# Patient Record
Sex: Male | Born: 1985 | Race: White | Hispanic: No | Marital: Single | Smoking: Current every day smoker
Health system: Southern US, Community
[De-identification: ages and names within clinical notes are randomized; demographics above are authoritative.]

## PROBLEM LIST (undated history)

## (undated) DIAGNOSIS — K219 Gastro-esophageal reflux disease without esophagitis: Secondary | ICD-10-CM

## (undated) DIAGNOSIS — F4310 Post-traumatic stress disorder, unspecified: Secondary | ICD-10-CM

## (undated) DIAGNOSIS — M1990 Unspecified osteoarthritis, unspecified site: Secondary | ICD-10-CM

## (undated) DIAGNOSIS — F1110 Opioid abuse, uncomplicated: Secondary | ICD-10-CM

## (undated) DIAGNOSIS — K759 Inflammatory liver disease, unspecified: Secondary | ICD-10-CM

## (undated) DIAGNOSIS — K2970 Gastritis, unspecified, without bleeding: Secondary | ICD-10-CM

## (undated) DIAGNOSIS — N3281 Overactive bladder: Secondary | ICD-10-CM

## (undated) DIAGNOSIS — E119 Type 2 diabetes mellitus without complications: Secondary | ICD-10-CM

## (undated) DIAGNOSIS — F319 Bipolar disorder, unspecified: Secondary | ICD-10-CM

## (undated) DIAGNOSIS — F1120 Opioid dependence, uncomplicated: Secondary | ICD-10-CM

## (undated) HISTORY — PX: WISDOM TOOTH EXTRACTION: SHX21

---

## 2011-11-05 ENCOUNTER — Emergency Department (HOSPITAL_COMMUNITY)
Admission: EM | Admit: 2011-11-05 | Discharge: 2011-11-05 | Disposition: A | Discharge status: Home / Self Care | Payer: Self-pay | Attending: Emergency Medicine | Admitting: Emergency Medicine

## 2011-11-05 ENCOUNTER — Emergency Department (HOSPITAL_COMMUNITY): Payer: Self-pay

## 2011-11-05 ENCOUNTER — Encounter (HOSPITAL_COMMUNITY): Payer: Self-pay | Admitting: *Deleted

## 2011-11-05 DIAGNOSIS — F1920 Other psychoactive substance dependence, uncomplicated: Secondary | ICD-10-CM | POA: Insufficient documentation

## 2011-11-05 DIAGNOSIS — F17200 Nicotine dependence, unspecified, uncomplicated: Secondary | ICD-10-CM | POA: Insufficient documentation

## 2011-11-05 DIAGNOSIS — M25561 Pain in right knee: Secondary | ICD-10-CM

## 2011-11-05 DIAGNOSIS — M25569 Pain in unspecified knee: Secondary | ICD-10-CM | POA: Insufficient documentation

## 2011-11-05 LAB — CBC
MCV: 91.5 fL (ref 78.0–100.0)
Platelets: 151 10*3/uL (ref 150–400)
RBC: 4.48 MIL/uL (ref 4.22–5.81)
RDW: 12.3 % (ref 11.5–15.5)
WBC: 7 10*3/uL (ref 4.0–10.5)

## 2011-11-05 LAB — COMPREHENSIVE METABOLIC PANEL
ALT: 62 U/L — ABNORMAL HIGH (ref 0–53)
AST: 41 U/L — ABNORMAL HIGH (ref 0–37)
Albumin: 4.1 g/dL (ref 3.5–5.2)
Alkaline Phosphatase: 99 U/L (ref 39–117)
CO2: 29 mEq/L (ref 19–32)
Chloride: 101 mEq/L (ref 96–112)
GFR calc non Af Amer: >90 mL/min (ref >90)
Potassium: 4 mEq/L (ref 3.5–5.1)
Sodium: 139 mEq/L (ref 135–145)
Total Bilirubin: 0.3 mg/dL (ref 0.3–1.2)

## 2011-11-05 LAB — RAPID URINE DRUG SCREEN, HOSP PERFORMED
Amphetamines: NOT DETECTED
Barbiturates: NOT DETECTED
Tetrahydrocannabinol: NOT DETECTED

## 2011-11-05 MED ORDER — IBUPROFEN 800 MG PO TABS
800.0000 mg | ORAL_TABLET | Freq: Three times a day (TID) | ORAL | Status: DC | PRN
  Administered 2011-11-05: 800 mg via ORAL
  Filled 2011-11-05: qty 1

## 2011-11-05 NOTE — ED Notes (Signed)
Meal tray has been ordered.

## 2011-11-05 NOTE — ED Notes (Signed)
Pt needs medical clearance and psych clearance.  Pt is not suicidal or homocidal, but is trying to get in a lifeskills program in [place].  Pt has pain with right knee

## 2011-11-05 NOTE — ED Provider Notes (Addendum)
History     CSN: [id number]  Arrival date & time 11/05/11  1254   First MD Initiated Contact with Patient 11/05/11 1408      Chief Complaint  Patient presents with  . Medical Clearance  . Knee Injury    right    (Consider location/radiation/quality/duration/timing/severity/associated sxs/prior treatment) HPI Comments: Patient presents today for medical clearance for a drug rehabilitation program.  Patient denies any suicidal or homicidal thoughts.  Patient also complains of pain in his right knee has been ongoing for 3 years.  He notes an old right knee injury when he was a proximally 26 years old.  He twisting knee injury and was told he had an anterior cruciate ligament tear which he never followed up on.  He now has problems with his knee locking up after sitting for longer periods.  Patient does not bear full weight on it but that is not new he is been walking like that for the last 3 years.  Patient denies any fevers redness or new injuries.  Patient is a 26 y.o. male presenting with mental health disorder. The history is provided by the patient.  Mental Health Problem  Additional symptoms of the illness do not include no headaches or no abdominal pain.    History reviewed. No pertinent past medical history.  History reviewed. No pertinent past surgical history.  No family history on file.  History  Substance Use Topics  . Smoking status: Current Everyday Smoker  . Smokeless tobacco: Not on file  . Alcohol Use: Yes     Former occ      Review of Systems  Constitutional: Negative.  Negative for fever and chills.  HENT: Negative.   Eyes: Negative.  Negative for discharge and redness.  Respiratory: Negative.  Negative for cough and shortness of breath.   Cardiovascular: Negative.  Negative for chest pain.  Gastrointestinal: Negative.  Negative for nausea, vomiting and abdominal pain.  Genitourinary: Negative.  Negative for hematuria.  Musculoskeletal: Negative for  back pain.  Skin: Negative.  Negative for color change and rash.  Neurological: Negative for syncope and headaches.  Hematological: Negative.  Negative for adenopathy.  Psychiatric/Behavioral: Negative for confusion.  All other systems reviewed and are negative.    Allergies  Review of patient's allergies indicates no known allergies.  Home Medications  No current outpatient prescriptions on file.  BP 106/74  Temp(Src) 97.6 F (36.4 C) (Oral)  Resp 20  SpO2 97%  Physical Exam  Nursing note and vitals reviewed. Constitutional: He is oriented to person, place, and time. He appears well-developed and well-nourished.  Non-toxic appearance. He does not have a sickly appearance.  HENT:  Head: Normocephalic and atraumatic.  Eyes: Conjunctivae, EOM and lids are normal. Pupils are equal, round, and reactive to light.  Neck: Trachea normal, normal range of motion and full passive range of motion without pain. Neck supple.  Cardiovascular: Normal rate, regular rhythm and normal heart sounds.   Pulmonary/Chest: Effort normal and breath sounds normal. No respiratory distress.  Abdominal: Soft. Normal appearance. He exhibits no distension. There is no tenderness. There is no rebound and no CVA tenderness.  Musculoskeletal: Normal range of motion.       Patient can flex his right knee but has some discomfort with doing so.  No specific anterior or posterior drawer sign.  No medial or lateral joint laxity.  No swelling, crepitus, erythema or warmth to the joint.  Patient can walk but does not have a normal gait  but he states this is not new that he does not put full weight on that right leg.  Neurological: He is alert and oriented to person, place, and time. He has normal strength.  Skin: Skin is warm, dry and intact. No rash noted.  Psychiatric: He has a normal mood and affect. His behavior is normal. Judgment and thought content normal.    ED Course  Procedures (including critical care  time)  Results for orders placed during the hospital encounter of 11/05/11  CBC      Component Value Range   WBC 7.0  4.0 - 10.5 (K/uL)   RBC 4.48  4.22 - 5.81 (MIL/uL)   Hemoglobin 14.4  13.0 - 17.0 (g/dL)   HCT 16.1  09.6 - 04.5 (%)   MCV 91.5  78.0 - 100.0 (fL)   MCH 32.1  26.0 - 34.0 (pg)   MCHC 35.1  30.0 - 36.0 (g/dL)   RDW 40.9  81.1 - 91.4 (%)   Platelets 151  150 - 400 (K/uL)  COMPREHENSIVE METABOLIC PANEL      Component Value Range   Sodium 139  135 - 145 (mEq/L)   Potassium 4.0  3.5 - 5.1 (mEq/L)   Chloride 101  96 - 112 (mEq/L)   CO2 29  19 - 32 (mEq/L)   Glucose, Bld 80  70 - 99 (mg/dL)   BUN 14  6 - 23 (mg/dL)   Creatinine, Ser 7.82  0.50 - 1.35 (mg/dL)   Calcium 95.6  8.4 - 10.5 (mg/dL)   Total Protein 7.3  6.0 - 8.3 (g/dL)   Albumin 4.1  3.5 - 5.2 (g/dL)   AST 41 (*) 0 - 37 (U/L)   ALT 62 (*) 0 - 53 (U/L)   Alkaline Phosphatase 99  39 - 117 (U/L)   Total Bilirubin 0.3  0.3 - 1.2 (mg/dL)   GFR calc non Af Amer >90  >90 (mL/min)   GFR calc Af Amer >90  >90 (mL/min)  ETHANOL      Component Value Range   Alcohol, Ethyl (B) <11  0 - 11 (mg/dL)  URINE RAPID DRUG SCREEN (HOSP PERFORMED)      Component Value Range   Opiates NONE DETECTED  NONE DETECTED    Cocaine NONE DETECTED  NONE DETECTED    Benzodiazepines NONE DETECTED  NONE DETECTED    Amphetamines NONE DETECTED  NONE DETECTED    Tetrahydrocannabinol NONE DETECTED  NONE DETECTED    Barbiturates NONE DETECTED  NONE DETECTED    Dg Knee 2 Views Right  11/05/2011  *RADIOLOGY REPORT*  Clinical Data: Pain  RIGHT KNEE - 1-2 VIEW  Comparison: None.  Findings: There is no evidence of bone, joint, or soft tissue abnormality.  IMPRESSION: Negative right knee.  Original Report Authenticated By: Brandon Melnick, M.D.       MDM  Patient here for medical clearance for a drug rehabilitation program.  He has a has persistent pain in his right knee which is likely a ligamentous or meniscal injury that will need further  evaluation by an orthopedist as an outpatient.  He is no acute signs of infection or other issues.  I've contacted the act team for further evaluation and clearance.        Nat Christen, MD 11/05/11 1427  I have reviewed the patient's laboratory studies and x-ray and there are no acute abnormalities that require further intervention at this time.  I have contacted the act team and they will  evaluate the patient.    Nat Christen, MD 11/05/11 [phone]

## 2011-11-05 NOTE — ED Notes (Signed)
Pt was given a happy meal

## 2011-11-05 NOTE — ED Provider Notes (Signed)
Patient has been assessed by the act team. He is requesting detox he will be given outpatient referrals. Patient is not suicidal or homicidal and does not require inpatient admission at this point  Celene Kras, MD 11/05/11 1907

## 2011-11-05 NOTE — ED Notes (Signed)
Pt complaining of R knee pain 6/10. MD notified. MEdicated as listed.
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
## 2011-11-05 NOTE — ED Notes (Signed)
Pt is talking to ACT team

## 2011-12-30 ENCOUNTER — Emergency Department (HOSPITAL_COMMUNITY)
Admission: EM | Admit: 2011-12-30 | Discharge: 2011-12-30 | Discharge status: Against Medical Advice | Payer: Self-pay | Attending: Emergency Medicine | Admitting: Emergency Medicine

## 2011-12-30 ENCOUNTER — Encounter (HOSPITAL_COMMUNITY): Payer: Self-pay | Admitting: *Deleted

## 2011-12-30 DIAGNOSIS — M545 Low back pain, unspecified: Secondary | ICD-10-CM | POA: Insufficient documentation

## 2011-12-30 DIAGNOSIS — M79609 Pain in unspecified limb: Secondary | ICD-10-CM | POA: Insufficient documentation

## 2011-12-30 NOTE — ED Notes (Addendum)
Pt did not answer when called in main waiting room for reeval.

## 2011-12-30 NOTE — ED Notes (Signed)
No answer x3

## 2011-12-30 NOTE — ED Notes (Signed)
No answer x1
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
## 2012-06-14 ENCOUNTER — Encounter (HOSPITAL_COMMUNITY): Payer: Self-pay | Admitting: Emergency Medicine

## 2012-06-14 ENCOUNTER — Emergency Department (HOSPITAL_COMMUNITY)
Admission: EM | Admit: 2012-06-14 | Discharge: 2012-06-16 | Disposition: A | Discharge status: Home / Self Care | Payer: Self-pay | Attending: Emergency Medicine | Admitting: Emergency Medicine

## 2012-06-14 DIAGNOSIS — F1210 Cannabis abuse, uncomplicated: Secondary | ICD-10-CM | POA: Insufficient documentation

## 2012-06-14 DIAGNOSIS — F1120 Opioid dependence, uncomplicated: Secondary | ICD-10-CM | POA: Insufficient documentation

## 2012-06-14 DIAGNOSIS — F17200 Nicotine dependence, unspecified, uncomplicated: Secondary | ICD-10-CM | POA: Insufficient documentation

## 2012-06-14 DIAGNOSIS — F1910 Other psychoactive substance abuse, uncomplicated: Secondary | ICD-10-CM

## 2012-06-14 LAB — COMPREHENSIVE METABOLIC PANEL
ALT: 16 U/L (ref 0–53)
AST: 25 U/L (ref 0–37)
Alkaline Phosphatase: 85 U/L (ref 39–117)
CO2: 30 mEq/L (ref 19–32)
Calcium: 9.9 mg/dL (ref 8.4–10.5)
GFR calc Af Amer: >90 mL/min (ref >90)
Glucose, Bld: 93 mg/dL (ref 70–99)
Potassium: 4.1 mEq/L (ref 3.5–5.1)
Sodium: 137 mEq/L (ref 135–145)
Total Protein: 7 g/dL (ref 6.0–8.3)

## 2012-06-14 LAB — CBC
Hemoglobin: 14.7 g/dL (ref 13.0–17.0)
MCH: 33.6 pg (ref 26.0–34.0)
MCHC: 34.7 g/dL (ref 30.0–36.0)
Platelets: 144 10*3/uL — ABNORMAL LOW (ref 150–400)
RBC: 4.37 MIL/uL (ref 4.22–5.81)

## 2012-06-14 LAB — RAPID URINE DRUG SCREEN, HOSP PERFORMED
Barbiturates: NOT DETECTED
Benzodiazepines: NOT DETECTED
Cocaine: NOT DETECTED
Tetrahydrocannabinol: POSITIVE — AB

## 2012-06-14 MED ORDER — ZOLPIDEM TARTRATE 5 MG PO TABS: 5.0000 mg | ORAL_TABLET | Freq: Every evening | ORAL | Status: DC | PRN

## 2012-06-14 MED ORDER — ONDANSETRON HCL 8 MG PO TABS
4.0000 mg | ORAL_TABLET | Freq: Three times a day (TID) | ORAL | Status: DC | PRN
  Administered 2012-06-15 – 2012-06-16 (×2): 4 mg via ORAL
  Filled 2012-06-14 (×2): qty 1

## 2012-06-14 MED ORDER — LORAZEPAM 1 MG PO TABS
1.0000 mg | ORAL_TABLET | Freq: Four times a day (QID) | ORAL | Status: DC | PRN
  Administered 2012-06-14 – 2012-06-16 (×3): 1 mg via ORAL
  Filled 2012-06-14 (×3): qty 1

## 2012-06-14 MED ORDER — NICOTINE 7 MG/24HR TD PT24
7.0000 mg | MEDICATED_PATCH | Freq: Once | TRANSDERMAL | Status: DC
  Filled 2012-06-14: qty 1

## 2012-06-14 MED ORDER — ALUM & MAG HYDROXIDE-SIMETH 200-200-20 MG/5ML PO SUSP: 30.0000 mL | ORAL | Status: DC | PRN

## 2012-06-14 MED ORDER — ACETAMINOPHEN 325 MG PO TABS
650.0000 mg | ORAL_TABLET | ORAL | Status: DC | PRN
  Administered 2012-06-15 – 2012-06-16 (×2): 650 mg via ORAL
  Filled 2012-06-14 (×2): qty 2

## 2012-06-14 NOTE — ED Notes (Signed)
Patient belongings placed in personal belongings bag and placed in paper scrubs then wanded by security  

## 2012-06-14 NOTE — Progress Notes (Signed)
9:39 PM Pt complained to RN of anxiety, and of need for nicotine patch.  Rx Ativan 1 mg po q6h prn anxiety, nicotine patch.

## 2012-06-14 NOTE — ED Provider Notes (Signed)
History   This chart was scribed for Cole Munch, MD by Melba Coon. The patient was seen in room TR07C/TR07C and the patient's care was started at 6:11PM.    CSN: [id number]  Arrival date & time 06/14/12  1551   First MD Initiated Contact with Patient 06/14/12 1729      Chief Complaint  Patient presents with  . Medical Clearance  . Addiction Problem    (Consider location/radiation/quality/duration/timing/severity/associated sxs/prior treatment) The history is provided by the patient. No language interpreter was used.   Cole Morrison is a 26 y.o. male who presents to the Emergency Department for a detox from heroine. 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 Pt states that his last heroine use was this morning. Pt has never tried to quit, pt's church members notice his drug issues now. Pt smokes marijuana but denies any other drug use. No SI or HI.  Pt states that he has generalized body aches. No HA, fever, neck pain, sore throat, rash, back pain, CP, SOB, abd pain, n/v/d, dysuria, or extremity pain, edema, weakness, numbness, or tingling. No other pertinent medical symptoms.  History reviewed. No pertinent past medical history.  History reviewed. No pertinent past surgical history.  History reviewed. No pertinent family history.  History  Substance Use Topics  . Smoking status: Current Everyday Smoker  . Smokeless tobacco: Not on file  . Alcohol Use: Yes     Former occ      Review of Systems 10 Systems reviewed and all are negative for acute change except as noted in the HPI.   Allergies  Ibuprofen  Home Medications  No current outpatient prescriptions on file.  BP 106/71  Pulse 75  Temp 98.2 F (36.8 C) (Oral)  Resp 20  SpO2 100%  Physical Exam  Nursing note and vitals reviewed. Constitutional: He is oriented to person, place, and time. He appears well-developed and well-nourished. No distress.  HENT:  Head: Normocephalic and atraumatic.   Eyes: EOM are normal.  Neck: Neck supple. No tracheal deviation present.  Cardiovascular: Normal rate, regular rhythm and normal heart sounds.   No murmur heard. Pulmonary/Chest: Effort normal and breath sounds normal. No respiratory distress. He has no wheezes.  Musculoskeletal: Normal range of motion.  Neurological: He is alert and oriented to person, place, and time.  Skin: Skin is warm and dry.       Needle marks throughout without early cellulitis / abscess  Psychiatric: He has a normal mood and affect. His behavior is normal.    ED Course  Procedures (including critical care time)  DIAGNOSTIC STUDIES: Oxygen Saturation is 100% on room air, normal by my interpretation.    COORDINATION OF CARE:  6:14PM - Blood w/u, UA, and drug screen panel will be ordered for the pt.   Labs Reviewed  CBC - Abnormal; Notable for the following:    Platelets 144 (*)     All other components within normal limits  COMPREHENSIVE METABOLIC PANEL  ETHANOL  URINE RAPID DRUG SCREEN (HOSP PERFORMED)   No results found.   No diagnosis found.    MDM  I personally performed the services described in this documentation, which was scribed in my presence. The recorded information has been reviewed and considered.  This young male presents with request for assistance with heroin detoxification.  On exam the patient is in no distress.  There no concerning findings on his arms suggestive of early infection.  The patient is medically clear  for behavioral health evaluation       Cole Munch, MD 06/14/12 [phone]
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
## 2012-06-14 NOTE — ED Notes (Signed)
Pt here requesting detox from heroin use x 10 years; pt sts last use this am; pt denies other drug use or SI/HI

## 2012-06-14 NOTE — ED Notes (Signed)
Per ACT team, plan is to keep pt in ED overnight then [facility] or [facility] will consider accepting pt in AM based on court case being rescheduled/continued

## 2012-06-14 NOTE — ED Notes (Signed)
Patient given turkey sandwich and coffee 

## 2012-06-15 MED ORDER — NICOTINE 21 MG/24HR TD PT24
21.0000 mg | MEDICATED_PATCH | Freq: Once | TRANSDERMAL | Status: AC
  Administered 2012-06-15: 21 mg via TRANSDERMAL
  Filled 2012-06-15 (×2): qty 1

## 2012-06-15 NOTE — ED Notes (Signed)
Pt eating at this time.  Requesting something to help him sleep tonight.  Explained to him that he had something ordered that he could have when he was ready to go to sleep.  Pt voices understanding
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
## 2012-06-15 NOTE — ED Notes (Signed)
Pt st's he did not receive a dinner tray.  Tray ordered for same and pt made aware.  Pt also requesting med for pain and nausea.  Will offer Tylenol and Zofran.

## 2012-06-15 NOTE — ED Notes (Signed)
Pt resting with eyes closed.  Call light at bedside.  Patient Belonging Inventory Sheet filled out and belongings placed in locker 2.
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
## 2012-06-16 NOTE — ED Provider Notes (Signed)
Pt has been here for almost 2 days. Voluntary. Tried placing at [facility], [facility], and [facility] who will not accept because of legal issues. Pt has court dates upcoming. No reason to IVC or to continue to hold in ED. Will discharge with outpt resources.  Raeford Razor, MD 06/16/12 1241
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
## 2012-06-16 NOTE — ED Notes (Signed)
Patient is in shower at this time.

## 2012-06-16 NOTE — ED Notes (Signed)
Pt wanted to know when he would go to [facility]. Advised pt I would consult with the EDP/ Pt states" I'm having dreams of me taking percocet and herion. I'm dreaming of how I use to steal to get the drugs. I dream that the pills are falling and my mom and brother are picking them up and handing them to me." Notified EDP.
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
## 2012-10-22 ENCOUNTER — Encounter (HOSPITAL_COMMUNITY): Payer: Self-pay | Admitting: Emergency Medicine

## 2012-10-22 ENCOUNTER — Emergency Department (HOSPITAL_COMMUNITY)
Admission: EM | Admit: 2012-10-22 | Discharge: 2012-10-22 | Disposition: A | Discharge status: Home / Self Care | Payer: Self-pay | Attending: Emergency Medicine | Admitting: Emergency Medicine

## 2012-10-22 DIAGNOSIS — J111 Influenza due to unidentified influenza virus with other respiratory manifestations: Secondary | ICD-10-CM | POA: Insufficient documentation

## 2012-10-22 DIAGNOSIS — R51 Headache: Secondary | ICD-10-CM | POA: Insufficient documentation

## 2012-10-22 DIAGNOSIS — F17200 Nicotine dependence, unspecified, uncomplicated: Secondary | ICD-10-CM | POA: Insufficient documentation

## 2012-10-22 DIAGNOSIS — J029 Acute pharyngitis, unspecified: Secondary | ICD-10-CM | POA: Insufficient documentation

## 2012-10-22 DIAGNOSIS — R059 Cough, unspecified: Secondary | ICD-10-CM | POA: Insufficient documentation

## 2012-10-22 DIAGNOSIS — IMO0001 Reserved for inherently not codable concepts without codable children: Secondary | ICD-10-CM | POA: Insufficient documentation

## 2012-10-22 DIAGNOSIS — H9209 Otalgia, unspecified ear: Secondary | ICD-10-CM | POA: Insufficient documentation

## 2012-10-22 DIAGNOSIS — R110 Nausea: Secondary | ICD-10-CM | POA: Insufficient documentation

## 2012-10-22 DIAGNOSIS — R509 Fever, unspecified: Secondary | ICD-10-CM | POA: Insufficient documentation

## 2012-10-22 DIAGNOSIS — R05 Cough: Secondary | ICD-10-CM | POA: Insufficient documentation

## 2012-10-22 MED ORDER — HYDROCODONE-HOMATROPINE 5-1.5 MG/5ML PO SYRP: 5.0000 mL | ORAL_SOLUTION | Freq: Four times a day (QID) | ORAL | Status: DC | PRN

## 2012-10-22 MED ORDER — HYDROCODONE-ACETAMINOPHEN 7.5-500 MG/15ML PO SOLN: 15.0000 mL | Freq: Four times a day (QID) | ORAL | Status: DC | PRN

## 2012-10-22 MED ORDER — HYDROCODONE-ACETAMINOPHEN 5-325 MG PO TABS: 1.0000 | ORAL_TABLET | ORAL | Status: DC | PRN

## 2012-10-22 NOTE — ED Notes (Signed)
Pt c/o throbbing headache that started when symptoms started Friday; Pt states he has tried over the counter medications without relief. Pt mentating appropriately. Pt wearing mask.

## 2012-10-22 NOTE — ED Notes (Signed)
Dr. Plunkett at bedside.  

## 2012-10-22 NOTE — ED Notes (Signed)
Pt give d/c teaching and education on prescription. Pt verbalizes understanding of teachign and has no further questions upon d/c. Pt ambulatory leaving ED. Pt does not appear to be in acute distress upon d/c.

## 2012-10-22 NOTE — ED Notes (Signed)
Pt states symptoms started early Friday; Pt c/o chills, generalized body aches, nausea w/o vomiting, cough and runny nose. Pt denies diarrhea. Pt states cough productive with yellow/green mucus. Pt states feels like has been running fever and is hot/cold off and on.

## 2012-10-22 NOTE — ED Provider Notes (Signed)
History     CSN: [id number]  Arrival date & time 10/22/12  1139   First MD Initiated Contact with Patient 10/22/12 1158      Chief Complaint  Patient presents with  . Generalized Body Aches  . Cough  . Nausea    (Consider location/radiation/quality/duration/timing/severity/associated sxs/prior treatment) Patient is a 27 y.o. male presenting with cough. The history is provided by the patient.  Cough This is a new problem. The current episode started yesterday. The problem occurs every few minutes. The problem has been gradually worsening. The cough is productive of sputum. The maximum temperature recorded prior to his arrival was 100 to 100.9 F. The fever has been present for less than 1 day. Associated symptoms include chills, ear pain, headaches, sore throat and myalgias. Pertinent negatives include no chest pain, no rhinorrhea and no wheezing. He has tried decongestants for the symptoms. The treatment provided mild relief. He is a smoker. His past medical history does not include COPD or asthma.    History reviewed. No pertinent past medical history.  History reviewed. No pertinent past surgical history.  History reviewed. No pertinent family history.  History  Substance Use Topics  . Smoking status: Current Every Day Smoker  . Smokeless tobacco: Not on file  . Alcohol Use: Yes     Comment: Former occ      Review of Systems  Constitutional: Positive for chills. Negative for fever.  HENT: Positive for ear pain and sore throat. Negative for rhinorrhea.   Respiratory: Positive for cough. Negative for wheezing.   Cardiovascular: Negative for chest pain.  Musculoskeletal: Positive for myalgias.  Neurological: Positive for headaches.  All other systems reviewed and are negative.    Allergies  Ibuprofen  Home Medications  No current outpatient prescriptions on file.  BP 117/79  Pulse 89  Temp 97.5 F (36.4 C) (Oral)  Resp 18  SpO2 100%  Physical Exam  Nursing  note and vitals reviewed. Constitutional: He is oriented to person, place, and time. He appears well-developed and well-nourished. No distress.  HENT:  Head: Normocephalic and atraumatic.  Right Ear: Tympanic membrane and ear canal normal.  Left Ear: Tympanic membrane and ear canal normal.  Mouth/Throat: Posterior oropharyngeal erythema present. No oropharyngeal exudate or posterior oropharyngeal edema.  Eyes: Conjunctivae normal and EOM are normal. Pupils are equal, round, and reactive to light.  Neck: Normal range of motion. Neck supple.  Cardiovascular: Normal rate, regular rhythm and intact distal pulses.   No murmur heard. Pulmonary/Chest: Effort normal and breath sounds normal. No respiratory distress. He has no wheezes. He has no rales.  Abdominal: Soft. He exhibits no distension. There is no tenderness. There is no rebound and no guarding.  Musculoskeletal: Normal range of motion. He exhibits no edema and no tenderness.  Lymphadenopathy:    He has no cervical adenopathy.  Neurological: He is alert and oriented to person, place, and time.  Skin: Skin is warm and dry. No rash noted. No erythema.  Psychiatric: He has a normal mood and affect. His behavior is normal.    ED Course  Procedures (including critical care time)  Labs Reviewed - No data to display No results found.   No diagnosis found.    MDM   Pt with symptoms consistent with influenza.  Normal exam here but is febrile.  No signs of breathing difficulty  No signs of strep pharyngitis, otitis or abnormal abdominal findings.  Pt is health and no indication for tamiflu.  He also  cannot afford it. Will continue antipyretica and rest and fluids and return for any further problems.         Gwyneth Sprout, MD 10/22/12 1212

## 2012-10-24 ENCOUNTER — Emergency Department (HOSPITAL_COMMUNITY): Payer: Self-pay

## 2012-10-24 ENCOUNTER — Emergency Department (HOSPITAL_COMMUNITY)
Admission: EM | Admit: 2012-10-24 | Discharge: 2012-10-24 | Disposition: A | Discharge status: Home / Self Care | Payer: Self-pay | Attending: Emergency Medicine | Admitting: Emergency Medicine

## 2012-10-24 ENCOUNTER — Encounter (HOSPITAL_COMMUNITY): Payer: Self-pay | Admitting: Emergency Medicine

## 2012-10-24 DIAGNOSIS — R05 Cough: Secondary | ICD-10-CM

## 2012-10-24 DIAGNOSIS — R300 Dysuria: Secondary | ICD-10-CM | POA: Insufficient documentation

## 2012-10-24 DIAGNOSIS — B9789 Other viral agents as the cause of diseases classified elsewhere: Secondary | ICD-10-CM | POA: Insufficient documentation

## 2012-10-24 DIAGNOSIS — R52 Pain, unspecified: Secondary | ICD-10-CM | POA: Insufficient documentation

## 2012-10-24 DIAGNOSIS — B349 Viral infection, unspecified: Secondary | ICD-10-CM

## 2012-10-24 DIAGNOSIS — N342 Other urethritis: Secondary | ICD-10-CM | POA: Insufficient documentation

## 2012-10-24 DIAGNOSIS — F17200 Nicotine dependence, unspecified, uncomplicated: Secondary | ICD-10-CM | POA: Insufficient documentation

## 2012-10-24 DIAGNOSIS — R109 Unspecified abdominal pain: Secondary | ICD-10-CM

## 2012-10-24 DIAGNOSIS — J3489 Other specified disorders of nose and nasal sinuses: Secondary | ICD-10-CM | POA: Insufficient documentation

## 2012-10-24 DIAGNOSIS — R059 Cough, unspecified: Secondary | ICD-10-CM | POA: Insufficient documentation

## 2012-10-24 LAB — COMPREHENSIVE METABOLIC PANEL
ALT: 12 U/L (ref 0–53)
AST: 24 U/L (ref 0–37)
CO2: 29 mEq/L (ref 19–32)
Calcium: 10 mg/dL (ref 8.4–10.5)
Chloride: 101 mEq/L (ref 96–112)
GFR calc Af Amer: >90 mL/min (ref >90)
GFR calc non Af Amer: >90 mL/min (ref >90)
Glucose, Bld: 111 mg/dL — ABNORMAL HIGH (ref 70–99)
Sodium: 140 mEq/L (ref 135–145)
Total Bilirubin: 0.2 mg/dL — ABNORMAL LOW (ref 0.3–1.2)

## 2012-10-24 LAB — CBC WITH DIFFERENTIAL/PLATELET
Basophils Relative: 1 % (ref 0–1)
Eosinophils Relative: 1 % (ref 0–5)
Hemoglobin: 15.6 g/dL (ref 13.0–17.0)
Lymphs Abs: 2.7 10*3/uL (ref 0.7–4.0)
MCH: 33.3 pg (ref 26.0–34.0)
MCV: 94.7 fL (ref 78.0–100.0)
Monocytes Absolute: 0.9 10*3/uL (ref 0.1–1.0)
Monocytes Relative: 9 % (ref 3–12)
Neutrophils Relative %: 63 % (ref 43–77)
RBC: 4.68 MIL/uL (ref 4.22–5.81)
WBC: 10.2 10*3/uL (ref 4.0–10.5)

## 2012-10-24 LAB — URINE MICROSCOPIC-ADD ON

## 2012-10-24 LAB — URINALYSIS, ROUTINE W REFLEX MICROSCOPIC
Glucose, UA: >1000 mg/dL — AB
Leukocytes, UA: NEGATIVE
Specific Gravity, Urine: 1.031 — ABNORMAL HIGH (ref 1.005–1.030)
pH: 7.5 (ref 5.0–8.0)

## 2012-10-24 MED ORDER — AZITHROMYCIN 250 MG PO TABS
1000.0000 mg | ORAL_TABLET | Freq: Once | ORAL | Status: AC
  Administered 2012-10-24: 1000 mg via ORAL
  Filled 2012-10-24: qty 4

## 2012-10-24 MED ORDER — BENZONATATE 100 MG PO CAPS: 100.0000 mg | ORAL_CAPSULE | Freq: Three times a day (TID) | ORAL | Status: DC | PRN

## 2012-10-24 MED ORDER — CEFTRIAXONE SODIUM 250 MG IJ SOLR
250.0000 mg | Freq: Once | INTRAMUSCULAR | Status: AC
  Administered 2012-10-24: 250 mg via INTRAMUSCULAR
  Filled 2012-10-24: qty 250

## 2012-10-24 MED ORDER — ALBUTEROL SULFATE HFA 108 (90 BASE) MCG/ACT IN AERS
2.0000 | INHALATION_SPRAY | RESPIRATORY_TRACT | Status: AC
  Administered 2012-10-24: 2 via RESPIRATORY_TRACT
  Filled 2012-10-24: qty 6.7

## 2012-10-24 NOTE — ED Notes (Signed)
Lab in with pt 

## 2012-10-24 NOTE — ED Provider Notes (Signed)
History     CSN: [id number]  Arrival date & time 10/24/12  1456   First MD Initiated Contact with Patient 10/24/12 1950      Chief Complaint  Patient presents with  . Flank Pain  . Generalized Body Aches     HPI Pt was seen at 2015.   Per pt, c/o gradual onset and persistence of constant right sided flank "pain" for the past several days. Has been associated with dysuria.  States he has also had runny/stuffy nose, sinus congestion, generalized body aches/fatigue, and cough for the past 3 to 4 days.  Denies sore throat, no testicular pain/swelling, no hematuria, no abd pain, no N/V/D, no rash, no fevers.    History reviewed. No pertinent past medical history.  History reviewed. No pertinent past surgical history.    History  Substance Use Topics  . Smoking status: Current Every Day Smoker  . Smokeless tobacco: Not on file  . Alcohol Use: Yes     Comment: Former occ    Review of Systems ROS: Statement: All systems negative except as marked or noted in the HPI; Constitutional: Negative for fever and chills. +generalized body aches/fatigue. ; ; Eyes: Negative for eye pain, redness and discharge. ; ; ENMT: Negative for ear pain, hoarseness, sore throat. +nasal congestion, sinus pressure. ; ; Cardiovascular: Negative for chest pain, palpitations, diaphoresis, dyspnea and peripheral edema. ; ; Respiratory: +cough. Negative for wheezing and stridor. ; ; Gastrointestinal: Negative for nausea, vomiting, diarrhea, abdominal pain, blood in stool, hematemesis, jaundice and rectal bleeding. . ; ; Genitourinary: +dysuria, flank pain. Negative for hematuria. ; ; Genital:  No penile drainage or rash, no testicular pain or swelling, no scrotal rash or swelling. ;; Musculoskeletal: Negative for back pain and neck pain. Negative for swelling and trauma.; ; Skin: Negative for pruritus, rash, abrasions, blisters, bruising and skin lesion.; ; Neuro: Negative for headache, lightheadedness and neck  stiffness. Negative for weakness, altered level of consciousness , altered mental status, extremity weakness, paresthesias, involuntary movement, seizure and syncope.       Allergies  Ibuprofen  Home Medications   Current Outpatient Rx  Name  Route  Sig  Dispense  Refill  . ACETAMINOPHEN 500 MG PO TABS   Oral   Take 1,000 mg by mouth 2 (two) times daily as needed. For pain         . HYDROCODONE-ACETAMINOPHEN 5-325 MG PO TABS   Oral   Take 2 tablets by mouth every 4 (four) hours as needed. For pain         . ROBITUSSIN COLD & COUGH PO   Oral   Take 20 mLs by mouth every 8 (eight) hours as needed. For cold         . HALLS COUGH DROPS MT LOZG   Mouth/Throat   Use as directed 1 lozenge in the mouth or throat every 2 (two) hours as needed. For cough, sore throat           BP 119/71  Pulse 85  Temp 98.7 F (37.1 C) (Oral)  Resp 18  SpO2 95%  Physical Exam 2020: Physical examination:  Nursing notes reviewed; Vital signs and O2 SAT reviewed;  Constitutional: Well developed, Well nourished, Well hydrated, In no acute distress; Head:  Normocephalic, atraumatic; Eyes: EOMI, PERRL, No scleral icterus; ENMT: Mouth and pharynx normal, Mucous membranes moist. Mouth and pharynx without lesions. No intra-oral edema. No hoarse voice, no drooling, no stridor.; Neck: Supple, Full range of motion, No  lymphadenopathy; Cardiovascular: Regular rate and rhythm, No murmur, rub, or gallop; Respiratory: Breath sounds clear & equal bilaterally, No rales, rhonchi, wheezes.  Speaking full sentences with ease, Normal respiratory effort/excursion; Chest: Nontender, Movement normal; Abdomen: Soft, Nontender, Nondistended, Normal bowel sounds; Spine:  No midline CS, TS, LS tenderness.;; Genitourinary: No CVA tenderness;  Genital exam performed with pt permission and male ED RN chaparone present during exam.  No perineal erythema.  No penile lesions or drainage.  No scrotal erythema, edema or tenderness  to palp.  Normal testicular lie. +cremasteric reflexes bilat.;; Spine:  No midline CS, TS, LS tenderness.;; Extremities: Pulses normal, No tenderness, No edema, No calf edema or asymmetry.; Neuro: AA&Ox3, Major CN grossly intact.  Speech clear. Climbs on and off stretcher by himself easily. Gait steady. No gross focal motor or sensory deficits in extremities.; Skin: Color normal, Warm, Dry.   ED Course  Procedures    MDM  MDM Reviewed: previous chart, nursing note and vitals Interpretation: labs and x-ray   Results for orders placed during the hospital encounter of 10/24/12  URINALYSIS, ROUTINE W REFLEX MICROSCOPIC      Component Value Range   Color, Urine YELLOW  YELLOW   APPearance CLEAR  CLEAR   Specific Gravity, Urine 1.031 (*) 1.005 - 1.030   pH 7.5  5.0 - 8.0   Glucose, UA >1000 (*) NEGATIVE mg/dL   Hgb urine dipstick NEGATIVE  NEGATIVE   Bilirubin Urine NEGATIVE  NEGATIVE   Ketones, ur 15 (*) NEGATIVE mg/dL   Protein, ur 30 (*) NEGATIVE mg/dL   Urobilinogen, UA 2.0 (*) 0.0 - 1.0 mg/dL   Nitrite NEGATIVE  NEGATIVE   Leukocytes, UA NEGATIVE  NEGATIVE  URINE MICROSCOPIC-ADD ON      Component Value Range   WBC, UA 0-2  <3 WBC/hpf   Bacteria, UA FEW (*) RARE   Urine-Other MUCOUS PRESENT    CBC WITH DIFFERENTIAL      Component Value Range   WBC 10.2  4.0 - 10.5 K/uL   RBC 4.68  4.22 - 5.81 MIL/uL   Hemoglobin 15.6  13.0 - 17.0 g/dL   HCT 09.8  11.9 - 14.7 %   MCV 94.7  78.0 - 100.0 fL   MCH 33.3  26.0 - 34.0 pg   MCHC 35.2  30.0 - 36.0 g/dL   RDW 82.9  56.2 - 13.0 %   Platelets 87 (*) 150 - 400 K/uL   Neutrophils Relative 63  43 - 77 %   Lymphocytes Relative 26  12 - 46 %   Monocytes Relative 9  3 - 12 %   Eosinophils Relative 1  0 - 5 %   Basophils Relative 1  0 - 1 %   Neutro Abs 6.4  1.7 - 7.7 K/uL   Lymphs Abs 2.7  0.7 - 4.0 K/uL   Monocytes Absolute 0.9  0.1 - 1.0 K/uL   Eosinophils Absolute 0.1  0.0 - 0.7 K/uL   Basophils Absolute 0.1  0.0 - 0.1 K/uL   WBC  Morphology ATYPICAL LYMPHOCYTES    COMPREHENSIVE METABOLIC PANEL      Component Value Range   Sodium 140  135 - 145 mEq/L   Potassium 3.9  3.5 - 5.1 mEq/L   Chloride 101  96 - 112 mEq/L   CO2 29  19 - 32 mEq/L   Glucose, Bld 111 (*) 70 - 99 mg/dL   BUN 11  6 - 23 mg/dL   Creatinine, Ser 8.65  0.50 - 1.35 mg/dL   Calcium 11.9  8.4 - 14.7 mg/dL   Total Protein 7.5  6.0 - 8.3 g/dL   Albumin 4.0  3.5 - 5.2 g/dL   AST 24  0 - 37 U/L   ALT 12  0 - 53 U/L   Alkaline Phosphatase 77  39 - 117 U/L   Total Bilirubin 0.2 (*) 0.3 - 1.2 mg/dL   GFR calc non Af Amer >90  >90 mL/min   GFR calc Af Amer >90  >90 mL/min  LIPASE, BLOOD      Component Value Range   Lipase 53  11 - 59 U/L   Ct Abdomen Pelvis Wo Contrast 10/24/2012  *RADIOLOGY REPORT*  Clinical Data: Right flank pain and burning with urination.  CT ABDOMEN AND PELVIS WITHOUT CONTRAST  Technique:  Multidetector CT imaging of the abdomen and pelvis was performed following the standard protocol without intravenous contrast.  Comparison: None.  Findings: Lung bases are clear.  No pleural or pericardial effusion.  There are no renal or ureteral stones.  The kidneys have a normal uninfused appearance bilaterally without hydronephrosis or focal lesion.  The gallbladder, liver, spleen, adrenal glands and pancreas appear normal.  There is no lymphadenopathy or fluid collection.  The patient has a moderately large stool burden throughout.  The colon is otherwise unremarkable.  The appendix, stomach and small bowel appear normal. Schmorl's nodes are seen in the superior endplates of T12 and L1.  No worrisome bony lesion is identified.  IMPRESSION:  Negative for urinary tract stone.  No acute finding.  Moderately large stool burden throughout the colon noted.   Original Report Authenticated By: Holley Dexter, M.D.    Dg Chest 2 View 10/24/2012  *RADIOLOGY REPORT*  Clinical Data: Upper abdominal pain on the right.  Coughing.  CHEST - 2 VIEW  Comparison: None.   Findings: Normal heart size with clear lung fields.  Mild peribronchial cuffing could suggest chronic bronchitic change or viral pneumonitis.  No lobar consolidation.  No effusion or pneumothorax.  No visible free air.  Bones unremarkable.  IMPRESSION: Mild peribronchial cuffing could suggest chronic bronchitic change or viral pneumonitis.  Correlate clinically.   Original Report Authenticated By: Davonna Belling, M.D.       2215:  No acute findings on workup today. Does c/o dysuria; will tx empirically for urethritis while GC/chlam pending.  Otherwise, appears viral illness at this time, will tx symptomatically.  Wants to go home now.  Dx and testing d/w pt.  Questions answered.  Verb understanding, agreeable to d/c home with outpt f/u.        Laray Anger, DO 10/26/12 1047

## 2012-10-24 NOTE — ED Notes (Signed)
Pt c/o right sided flank pain and burning with urination x several days; pt seen here for same and given hydrocodone but not feeling better

## 2012-10-25 ENCOUNTER — Telehealth (HOSPITAL_COMMUNITY): Payer: Self-pay | Admitting: Emergency Medicine

## 2012-10-25 LAB — GC/CHLAMYDIA PROBE AMP: GC Probe RNA: NEGATIVE

## 2012-10-25 NOTE — ED Notes (Signed)
Pt called requesting work note be changed to read bedrest until 10/31/12 instead on no work related activities until 10/31/12.  Felicita Gage PA consulted and ok to change note.

## 2012-10-26 LAB — URINE CULTURE

## 2012-11-16 ENCOUNTER — Emergency Department (INDEPENDENT_AMBULATORY_CARE_PROVIDER_SITE_OTHER)
Admission: EM | Admit: 2012-11-16 | Discharge: 2012-11-16 | Disposition: A | Discharge status: Home / Self Care | Payer: Self-pay | Source: Home / Self Care

## 2012-11-16 ENCOUNTER — Encounter (HOSPITAL_COMMUNITY): Payer: Self-pay

## 2012-11-16 DIAGNOSIS — K089 Disorder of teeth and supporting structures, unspecified: Secondary | ICD-10-CM

## 2012-11-16 DIAGNOSIS — K0889 Other specified disorders of teeth and supporting structures: Secondary | ICD-10-CM

## 2012-11-16 MED ORDER — HYDROCODONE-ACETAMINOPHEN 5-325 MG PO TABS: 1.0000 | ORAL_TABLET | Freq: Four times a day (QID) | ORAL | Status: DC | PRN

## 2012-11-16 MED ORDER — AMOXICILLIN 500 MG PO CAPS: 500.0000 mg | ORAL_CAPSULE | Freq: Three times a day (TID) | ORAL | Status: DC

## 2012-11-16 NOTE — ED Provider Notes (Signed)
History    CSN: [id number]  Arrival date & time 11/16/12  1539  Chief Complaint  Patient presents with  . Dental Pain   The history is provided by the patient.   Pt says that he is having dental pain that started 2 days ago.  Pt says that he called his dentist yesterday and was told that he couldn't be seen for 2 weeks.  He says that he would like to be referred to a new dentist for care.   Pt says that it hurts to chew and it hurts to open up the jaw and it hurts in the left side of jaw.   Patient did not bring any of his medical records today.  History reviewed. No pertinent past medical history.  History reviewed. No pertinent past surgical history.  No family history on file.  History  Substance Use Topics  . Smoking status: Current Every Day Smoker  . Smokeless tobacco: Not on file  . Alcohol Use: Yes     Comment: Former occ    Review of Systems  HENT:       Dental pain   All other systems reviewed and are negative.    Allergies  Ibuprofen  Home Medications   Current Outpatient Rx  Name  Route  Sig  Dispense  Refill  . ACETAMINOPHEN 500 MG PO TABS   Oral   Take 1,000 mg by mouth 2 (two) times daily as needed. For pain         . BENZONATATE 100 MG PO CAPS   Oral   Take 1 capsule (100 mg total) by mouth 3 (three) times daily as needed for cough.   15 capsule   0   . ROBITUSSIN COLD & COUGH PO   Oral   Take 20 mLs by mouth every 8 (eight) hours as needed. For cold         . HALLS COUGH DROPS MT LOZG   Mouth/Throat   Use as directed 1 lozenge in the mouth or throat every 2 (two) hours as needed. For cough, sore throat          BP 116/66  Pulse 107  Temp 98.5 F (36.9 C) (Oral)  Resp 19  SpO2 98%  Physical Exam  Nursing note and vitals reviewed. Constitutional: He appears well-developed and well-nourished. No distress.  HENT:  Head: Normocephalic and atraumatic.  Mouth/Throat: Mucous membranes are normal. Abnormal dentition. Dental caries  present. No oropharyngeal exudate, posterior oropharyngeal edema, posterior oropharyngeal erythema or tonsillar abscesses.    Eyes: EOM are normal. Pupils are equal, round, and reactive to light.  Cardiovascular: Normal rate, regular rhythm and normal heart sounds.     ED Course  Procedures (including critical care time)  Labs Reviewed - No data to display No results found.  No diagnosis found.  MDM  IMPRESSION  Dental Pain  History of opioid dependence   RECOMMENDATIONS / PLAN Referral can't be made to [facility] dental care until pt establishes an orange card, we gave him the instructions on how to do that today Pt says that he has his own dentist but they can't see him for another week - recommended he call his dentist to get in sooner for visit.Marland Kitchen Pt verbalized understanding.  Amoxicillin 500 mg po TID Hydrocodone 5mg  prn severe dental pain  FOLLOW UP With his dentist this week  The patient was given clear instructions to go to ER or return to medical center if symptoms don't improve,  worsen or new problems develop.  The patient verbalized understanding.  The patient was told to call to get lab results if they haven't heard anything in the next week.            Cleora Fleet, MD 11/16/12 1815

## 2012-11-16 NOTE — ED Notes (Signed)
Complain of toothache past couple of days. Pain to left side of face.

## 2013-01-15 ENCOUNTER — Encounter (HOSPITAL_COMMUNITY): Payer: Self-pay | Admitting: Emergency Medicine

## 2013-01-15 ENCOUNTER — Emergency Department (HOSPITAL_COMMUNITY)
Admission: EM | Admit: 2013-01-15 | Discharge: 2013-01-15 | Disposition: A | Discharge status: Home / Self Care | Payer: Self-pay | Attending: Emergency Medicine | Admitting: Emergency Medicine

## 2013-01-15 DIAGNOSIS — M129 Arthropathy, unspecified: Secondary | ICD-10-CM | POA: Insufficient documentation

## 2013-01-15 DIAGNOSIS — E119 Type 2 diabetes mellitus without complications: Secondary | ICD-10-CM | POA: Insufficient documentation

## 2013-01-15 DIAGNOSIS — X500XXA Overexertion from strenuous movement or load, initial encounter: Secondary | ICD-10-CM | POA: Insufficient documentation

## 2013-01-15 DIAGNOSIS — Y9339 Activity, other involving climbing, rappelling and jumping off: Secondary | ICD-10-CM | POA: Insufficient documentation

## 2013-01-15 DIAGNOSIS — Z87448 Personal history of other diseases of urinary system: Secondary | ICD-10-CM | POA: Insufficient documentation

## 2013-01-15 DIAGNOSIS — T148XXA Other injury of unspecified body region, initial encounter: Secondary | ICD-10-CM

## 2013-01-15 DIAGNOSIS — F17200 Nicotine dependence, unspecified, uncomplicated: Secondary | ICD-10-CM | POA: Insufficient documentation

## 2013-01-15 DIAGNOSIS — S139XXA Sprain of joints and ligaments of unspecified parts of neck, initial encounter: Secondary | ICD-10-CM | POA: Insufficient documentation

## 2013-01-15 DIAGNOSIS — M436 Torticollis: Secondary | ICD-10-CM | POA: Insufficient documentation

## 2013-01-15 DIAGNOSIS — Y929 Unspecified place or not applicable: Secondary | ICD-10-CM | POA: Insufficient documentation

## 2013-01-15 HISTORY — DX: Unspecified osteoarthritis, unspecified site: M19.90

## 2013-01-15 HISTORY — DX: Overactive bladder: N32.81

## 2013-01-15 HISTORY — DX: Type 2 diabetes mellitus without complications: E11.9

## 2013-01-15 MED ORDER — HYDROCODONE-ACETAMINOPHEN 5-325 MG PO TABS
1.0000 | ORAL_TABLET | Freq: Once | ORAL | Status: AC
  Administered 2013-01-15: 1 via ORAL
  Filled 2013-01-15: qty 1

## 2013-01-15 MED ORDER — HYDROCODONE-ACETAMINOPHEN 5-325 MG PO TABS: 1.0000 | ORAL_TABLET | Freq: Four times a day (QID) | ORAL | Status: DC | PRN

## 2013-01-15 NOTE — ED Notes (Signed)
Pt reports doing some heavy lifting yesterday and today c/o neck pain. Pt has history of arthritis.

## 2013-01-15 NOTE — ED Provider Notes (Signed)
History  This chart was scribed for non-physician practitioner, Jaci Carrel, PA-C working with Carleene Cooper III, MD by Shari Heritage, ED Scribe. This patient was seen in room TR06C/TR06C and the patient's care was started at 1837.  CSN: [id number]  Arrival date & time 01/15/13  1503   First MD Initiated Contact with Patient 01/15/13 1837      Chief Complaint  Patient presents with  . Neck Pain     The history is provided by the patient. No language interpreter was used.    HPI Comments: Cole Morrison is a 27 y.o. male who presents to the Emergency Department complaining of moderate, constant, non-radiating neck pain with associated neck stiffness onset this morning upon waking up. Patient states that he was climbing a ladder yesterday carrying a 5-lb bucket of roofing glue in his left hand when he suddenly turned as if he were about to fall before catching himself. He says that he didn't have pain immediately after this incident. He reports difficulty moving his neck. Patient hasn't taken any medicines for pain relief at home. There is no fever, chills or diaphoresis. He denies any other injuries at this time. Patient has a history of arthritis and diabetes.    Past Medical History  Diagnosis Date  . Arthritis   . Diabetes mellitus without complication Borderline    History reviewed. No pertinent past surgical history.  No family history on file.  History  Substance Use Topics  . Smoking status: Current Every Day Smoker  . Smokeless tobacco: Not on file  . Alcohol Use: Yes     Comment: seldom      Review of Systems  Constitutional: Negative for fever, chills, diaphoresis, activity change, appetite change, fatigue and unexpected weight change.  HENT: Positive for neck stiffness. Negative for facial swelling and neck pain.   Eyes: Negative for visual disturbance.  Musculoskeletal: Negative for back pain and gait problem.  Skin: Negative for rash.  Neurological: Negative  for dizziness, syncope and headaches.   A complete 10 system review of systems was obtained and all systems are negative except as noted in the HPI and PMH.   Allergies  Ibuprofen  Home Medications   Current Outpatient Rx  Name  Route  Sig  Dispense  Refill  . acetaminophen (TYLENOL) 500 MG tablet   Oral   Take 1,000 mg by mouth 2 (two) times daily as needed. For pain         . amoxicillin (AMOXIL) 500 MG capsule   Oral   Take 1 capsule (500 mg total) by mouth 3 (three) times daily.   21 capsule   0   . benzonatate (TESSALON) 100 MG capsule   Oral   Take 1 capsule (100 mg total) by mouth 3 (three) times daily as needed for cough.   15 capsule   0   . HYDROcodone-acetaminophen (NORCO/VICODIN) 5-325 MG per tablet   Oral   Take 1 tablet by mouth every 6 (six) hours as needed for pain.   15 tablet   0   . Pseudoephedrine-DM-GG (ROBITUSSIN COLD & COUGH PO)   Oral   Take 20 mLs by mouth every 8 (eight) hours as needed. For cold         . Throat Lozenges (HALLS COUGH DROPS) LOZG   Mouth/Throat   Use as directed 1 lozenge in the mouth or throat every 2 (two) hours as needed. For cough, sore throat  Triage Vitals: BP 116/80  Pulse 86  Temp(Src) 98.4 F (36.9 C) (Oral)  Resp 18  SpO2 100%  Physical Exam  Nursing note and vitals reviewed. Constitutional: He is oriented to person, place, and time. He appears well-developed and well-nourished. No distress.  HENT:  Head: Normocephalic and atraumatic.  Eyes: Conjunctivae and EOM are normal.  Neck: Muscular tenderness present.    Pulmonary/Chest: Effort normal.  Musculoskeletal: Normal range of motion.  Neurological: He is alert and oriented to person, place, and time.  Skin: Skin is warm and dry. No rash noted. He is not diaphoretic.  Psychiatric: He has a normal mood and affect. His behavior is normal.    ED Course  Procedures (including critical care time) DIAGNOSTIC STUDIES: Oxygen Saturation is  100% on room air, normal by my interpretation.    COORDINATION OF CARE: 6:57 PM- Patient informed of current plan for treatment and evaluation and agrees with plan at this time.      Labs Reviewed - No data to display No results found.   No diagnosis found.  BP 116/80  Pulse 86  Temp(Src) 98.4 F (36.9 C) (Oral)  Resp 18  SpO2 100%   MDM  Neck Stiffness, Muscular strain Pt is 27 yo who presented to ER c/o neck stiffness upon waking up this morning. NAD w nl VS. No signs of infective process. Will treat as muscular strain. Pt has anaphylactic allergy to NSAID & does not want muscle relaxer. Conservative tx discussed and will give short Rx for narcotics.  Advised PCP f-u. Strict return precautions discussed.        I personally performed the services described in this documentation, which was scribed in my presence. The recorded information has been reviewed and is accurate.      Jaci Carrel, [place] 01/15/13 1920

## 2013-01-16 NOTE — ED Provider Notes (Signed)
Medical screening examination/treatment/procedure(s) were performed by non-physician practitioner and as supervising physician I was immediately available for consultation/collaboration.   Carleene Cooper III, MD 01/16/13 [phone]

## 2013-02-09 ENCOUNTER — Encounter (HOSPITAL_COMMUNITY): Payer: Self-pay | Admitting: Emergency Medicine

## 2013-02-09 DIAGNOSIS — F17200 Nicotine dependence, unspecified, uncomplicated: Secondary | ICD-10-CM | POA: Insufficient documentation

## 2013-02-09 DIAGNOSIS — E119 Type 2 diabetes mellitus without complications: Secondary | ICD-10-CM | POA: Insufficient documentation

## 2013-02-09 DIAGNOSIS — H5789 Other specified disorders of eye and adnexa: Secondary | ICD-10-CM | POA: Insufficient documentation

## 2013-02-09 DIAGNOSIS — M129 Arthropathy, unspecified: Secondary | ICD-10-CM | POA: Insufficient documentation

## 2013-02-09 DIAGNOSIS — Z87448 Personal history of other diseases of urinary system: Secondary | ICD-10-CM | POA: Insufficient documentation

## 2013-02-09 NOTE — ED Notes (Signed)
PT. REPORTS CHRONIC ARTHRITIC PAIN AT ANKLES/KNEES AND TOES FOR SEVERAL YEARS APPOINTMENT WITH RHEUMATOLOGIST IN 2 WEEKS .

## 2013-02-10 ENCOUNTER — Emergency Department (HOSPITAL_COMMUNITY)
Admission: EM | Admit: 2013-02-10 | Discharge: 2013-02-10 | Disposition: A | Discharge status: Home / Self Care | Payer: Self-pay | Attending: Emergency Medicine | Admitting: Emergency Medicine

## 2013-02-10 DIAGNOSIS — H1132 Conjunctival hemorrhage, left eye: Secondary | ICD-10-CM

## 2013-02-10 DIAGNOSIS — M1990 Unspecified osteoarthritis, unspecified site: Secondary | ICD-10-CM

## 2013-02-10 MED ORDER — PREDNISONE 20 MG PO TABS: ORAL_TABLET | ORAL | Status: DC

## 2013-02-10 MED ORDER — TETRACAINE HCL 0.5 % OP SOLN
1.0000 [drp] | Freq: Once | OPHTHALMIC | Status: AC
  Administered 2013-02-10: 1 [drp] via OPHTHALMIC
  Filled 2013-02-10: qty 2

## 2013-02-10 MED ORDER — FLUORESCEIN SODIUM 1 MG OP STRP
1.0000 | ORAL_STRIP | Freq: Once | OPHTHALMIC | Status: AC
  Administered 2013-02-10: 1 via OPHTHALMIC
  Filled 2013-02-10: qty 1

## 2013-02-10 MED ORDER — HYDROCODONE-ACETAMINOPHEN 5-325 MG PO TABS: ORAL_TABLET | ORAL | Status: DC

## 2013-02-10 NOTE — ED Notes (Signed)
Pt states he is having a flair-up of his RA.  Pt states pain in only in his hands currently.  Pt able to ambulate from lobby to room without difficulty.  Pt also stated he is losing vision in his left eye.  PA made aware

## 2013-02-10 NOTE — ED Provider Notes (Signed)
History     CSN: [id number]  Arrival date & time 02/09/13  2224   First MD Initiated Contact with Patient 02/10/13 0017      Chief Complaint  Patient presents with  . Joint Pain    (Consider location/radiation/quality/duration/timing/severity/associated sxs/prior treatment) HPI Comments: Patient presents with complaint of worsening rheumatoid arthritis pain in his hands, forearms, knees and ankles areas. He has had this for several years. He is on medication but cannot remember the name of. He's been taking Tylenol without relief. States that he has been having trouble grasping objects is been dropping objects. No fevers. Patient also states that yesterday he ran into a tree branch. Branch went into his left eye. Patient states that he has had blurry vision in his eye. No loss of vision. No pain with movement of eye. No photophobia. No treatments prior to arrival. Onset of symptoms gradual. Course is constant. Nothing makes symptoms better.  The history is provided by the patient.    Past Medical History  Diagnosis Date  . Arthritis   . Diabetes mellitus without complication Borderline  . Overactive bladder     History reviewed. No pertinent past surgical history.  No family history on file.  History  Substance Use Topics  . Smoking status: Current Every Day Smoker  . Smokeless tobacco: Not on file  . Alcohol Use: Yes     Comment: seldom      Review of Systems  Constitutional: Negative for fever and activity change.  HENT: Negative for sore throat, rhinorrhea and neck pain.   Eyes: Positive for redness and visual disturbance. Negative for photophobia, pain, discharge and itching.  Respiratory: Negative for cough.   Cardiovascular: Negative for chest pain.  Gastrointestinal: Negative for nausea, vomiting, abdominal pain and diarrhea.  Genitourinary: Negative for dysuria.  Musculoskeletal: Positive for arthralgias. Negative for myalgias, back pain, joint swelling and  gait problem.  Skin: Negative for rash and wound.  Neurological: Negative for weakness, numbness and headaches.    Allergies  Ibuprofen and Nsaids  Home Medications   Current Outpatient Rx  Name  Route  Sig  Dispense  Refill  . acetaminophen (TYLENOL) 500 MG tablet   Oral   Take 1,000 mg by mouth 2 (two) times daily as needed. For pain         . HYDROcodone-acetaminophen (NORCO/VICODIN) 5-325 MG per tablet      Take 1-2 tablets every 6 hours as needed for severe pain   12 tablet   0   . predniSONE (DELTASONE) 20 MG tablet      3 Tabs PO Days 1-3, then 2 tabs PO Days 4-6, then 1 tab PO Day 7-9, then Half Tab PO Day 10-12   20 tablet   0     BP 137/69  Pulse 88  Temp(Src) 98.4 F (36.9 C) (Oral)  Resp 16  SpO2 97%  Physical Exam  Nursing note and vitals reviewed. Constitutional: He appears well-developed and well-nourished.  HENT:  Head: Normocephalic and atraumatic.  Eyes: Lids are normal. Pupils are equal, round, and reactive to light. Right eye exhibits no chemosis, no discharge and no exudate. Left eye exhibits chemosis (mild, lateral). Left eye exhibits no discharge and no exudate. No foreign body present in the left eye. Right conjunctiva is not injected. Right conjunctiva has no hemorrhage. Left conjunctiva is not injected. Left conjunctiva has a hemorrhage (subconjunctival hemorrhage laterally). Right eye exhibits normal extraocular motion. Left eye exhibits normal extraocular motion. Right pupil is  round and reactive. Left pupil is round and reactive. Pupils are equal.  Slit lamp exam:      The left eye shows no corneal abrasion, no corneal flare, no corneal ulcer, no foreign body, no hyphema, no fluorescein uptake and no anterior chamber bulge.  Neck: Normal range of motion. Neck supple.  Cardiovascular: Normal rate, regular rhythm and normal heart sounds.   Pulmonary/Chest: Effort normal and breath sounds normal.  Abdominal: Soft. There is no tenderness.   Musculoskeletal: He exhibits no edema and no tenderness.  Generalized tenderness of wrists, hands, knees, ankles, and feet with normal ROM, swelling, redness, warmth.   Neurological: He is alert.  Skin: Skin is warm and dry.  Psychiatric: He has a normal mood and affect.    ED Course  Procedures (including critical care time)  Labs Reviewed - No data to display No results found.   1. Arthritis   2. Subconjunctival hemorrhage, left     1:10 AM Patient seen and examined. Work-up initiated. Medications ordered.   Vital signs reviewed and are as follows: Filed Vitals:   02/09/13 2231  BP: 137/69  Pulse: 88  Temp: 98.4 F (36.9 C)  Resp: 16   Two drops of tetracaine/proparacaine instilled into affected eye.   Fluorescein strip applied to affected eye. Slit lamp used to assess for corneal abrasion. No corneal abrasion identified. No foreign bodies noted. No visible hyphema. There is uptake of the lateral sclera but no laceration seen.   Tonometry performed.  Left eye pressure: 15  Patient tolerated procedure well without immediate complication.   Visual acuity 20/70 in left eye, 20/40 combined.     Prednisone taper given for presumed rheumatoid arthritis.   Patient counseled on use of narcotic pain medications. Counseled not to combine these medications with others containing tylenol. Urged not to drink alcohol, drive, or perform any other activities that requires focus while taking these medications. The patient verbalizes understanding and agrees with the plan.  Urged pt to keep appt with his doctor at [facility] in two weeks.      MDM  Joint pain, per pt, flare of rheumatoid arthritis. No infection or other concerning findings on exam. Normal ROM. Tx with PO prednisone and pain medication until f/u.   As far as eye injury: No foreign bodies noted. No surrounding erythema, swelling, vision changes/loss suspicious for orbital or periorbital cellulitis. No signs of  iritis, no photophobia, flare, limbal erythema. No signs of laceration, intraocular pressure normal. Visual acuity is 20/70 in affected eye. No restricted movement. Do not suspect globe rupture given exam. No symptoms of retinal detachment. No ophthalmologic emergency suspected. Outpatient referral given in case of no improvement.          Renne Crigler, PA-C 02/11/13 1401

## 2013-02-12 NOTE — ED Provider Notes (Signed)
Medical screening examination/treatment/procedure(s) were performed by non-physician practitioner and as supervising physician I was immediately available for consultation/collaboration.   Dail Meece W Azaleah Usman, MD 02/12/13 0503 

## 2013-03-14 ENCOUNTER — Emergency Department (INDEPENDENT_AMBULATORY_CARE_PROVIDER_SITE_OTHER): Payer: Self-pay

## 2013-03-14 ENCOUNTER — Encounter (HOSPITAL_COMMUNITY): Payer: Self-pay | Admitting: *Deleted

## 2013-03-14 ENCOUNTER — Emergency Department (INDEPENDENT_AMBULATORY_CARE_PROVIDER_SITE_OTHER)
Admission: EM | Admit: 2013-03-14 | Discharge: 2013-03-14 | Disposition: A | Discharge status: Home / Self Care | Payer: Self-pay | Source: Home / Self Care | Attending: Emergency Medicine | Admitting: Emergency Medicine

## 2013-03-14 DIAGNOSIS — S7010XA Contusion of unspecified thigh, initial encounter: Secondary | ICD-10-CM

## 2013-03-14 DIAGNOSIS — S39012A Strain of muscle, fascia and tendon of lower back, initial encounter: Secondary | ICD-10-CM

## 2013-03-14 DIAGNOSIS — M79609 Pain in unspecified limb: Secondary | ICD-10-CM

## 2013-03-14 DIAGNOSIS — IMO0002 Reserved for concepts with insufficient information to code with codable children: Secondary | ICD-10-CM

## 2013-03-14 DIAGNOSIS — M79605 Pain in left leg: Secondary | ICD-10-CM

## 2013-03-14 DIAGNOSIS — S7012XA Contusion of left thigh, initial encounter: Secondary | ICD-10-CM

## 2013-03-14 MED ORDER — TRAMADOL HCL 50 MG PO TABS: 50.0000 mg | ORAL_TABLET | Freq: Three times a day (TID) | ORAL | Status: DC | PRN

## 2013-03-14 NOTE — ED Notes (Signed)
Reports falling into a hole this morning.  C/O left lower back and left posterior thigh pain.  Denies parasthesias.  Has not taken any measures to help alleviate sxs.

## 2013-03-14 NOTE — ED Provider Notes (Addendum)
Or History     CSN: [id number]  Arrival date & time 03/14/13  1659   First MD Initiated Contact with Patient 03/14/13 1826      Chief Complaint  Patient presents with  . Leg Pain  . Back Pain    (Consider location/radiation/quality/duration/timing/severity/associated sxs/prior treatment) HPI Comments: Patient presents urgent care limping after falling into a water-hole this morning. Describes pain in the lateral aspect of his left upper leg and the posterior aspect of his gluteal and thigh region.Worsens with movement and activity and weightbearing on his left side. "  It feels like when I had sciatica". Patient denies any numbness tingling,sensation ,urinary or fecal incontinence or lower extremety weakness. " Limping when i walk " Patient has not taken any pain medicine and is describing that he had an allergic reaction to ibuprofen in the past "  mouth became swollen" " and have been told not to take any other NSAID" (PATIENT DESCRIBES)  "i HAVE TAKEN ALEVE BUT HAVE ALSO HAD SOME RECATIONS TO IT" CANT RECALL WHAT HAPPENNED"...  Patient is a 27 y.o. male presenting with leg pain. The history is provided by the patient.  Leg Pain Location:  Leg and buttock Buttock location:  L buttock Leg location:  L leg and L lower leg Pain details:    Quality:  Aching   Radiates to:  Back   Severity:  Moderate   Onset quality:  Sudden Chronicity:  New Dislocation: no   Foreign body present:  No foreign bodies Prior injury to area:  No Relieved by:  Movement Associated symptoms: back pain and decreased ROM   Associated symptoms: no fatigue, no fever, no muscle weakness, no neck pain, no stiffness, no swelling and no tingling     Past Medical History  Diagnosis Date  . Arthritis   . Diabetes mellitus without complication Borderline  . Overactive bladder     History reviewed. No pertinent past surgical history.  History reviewed. No pertinent family history.  History  Substance Use  Topics  . Smoking status: Current Every Day Smoker -- 0.50 packs/day    Types: Cigarettes  . Smokeless tobacco: Not on file  . Alcohol Use: No      Review of Systems  Constitutional: Positive for activity change. Negative for fever, diaphoresis, fatigue and unexpected weight change.  HENT: Negative for neck pain.   Genitourinary: Negative for dysuria, frequency and flank pain.  Musculoskeletal: Positive for back pain. Negative for myalgias, joint swelling, arthralgias and stiffness.  Skin: Negative for color change, rash and wound.  Neurological: Negative for weakness and numbness.    Allergies  Ibuprofen and Nsaids  Home Medications   Current Outpatient Rx  Name  Route  Sig  Dispense  Refill  . acetaminophen (TYLENOL) 500 MG tablet   Oral   Take 1,000 mg by mouth 2 (two) times daily as needed. For pain         . traMADol (ULTRAM) 50 MG tablet   Oral   Take 1 tablet (50 mg total) by mouth every 8 (eight) hours as needed for pain.   10 tablet   0     BP 99/66  Pulse 74  Temp(Src) 98.1 F (36.7 C) (Oral)  Resp 17  SpO2 100%  Physical Exam  Nursing note and vitals reviewed. Constitutional: Vital signs are normal. He appears well-developed and well-nourished. He does not have a sickly appearance. He does not appear ill.  Musculoskeletal: He exhibits tenderness.  Legs: Skin: No rash noted. No erythema. No pallor.    ED Course  Procedures (including critical care time)  Labs Reviewed - No data to display Dg Lumbar Spine Complete  03/14/2013   *RADIOLOGY REPORT*  Clinical Data: Trauma, stepped in hole  LUMBAR SPINE - COMPLETE 4+ VIEW  Comparison: CT 10/24/2012  Findings: Levoscoliosis of the lumbar spine noted.  Normal alignment lumbar vertebral bodies on the sagittal projection.  No loss of vertebral body height or disc height.  No pars fracture.  IMPRESSION: No acute findings lumbar spine.   Original Report Authenticated By: Genevive Bi, M.D.   Dg  Femur Left  03/14/2013   *RADIOLOGY REPORT*  Clinical Data: History of fall complaining of left leg pain.  LEFT FEMUR - 2 VIEW  Comparison: No priors.  Findings: Multiple views of the left femur demonstrate no acute displaced fracture or focal soft tissue abnormality.  IMPRESSION: 1.  No acute radiographic abnormality of the left femur.   Original Report Authenticated By: Trudie Reed, M.D.     1. Leg pain, lateral, left   2. Sprain and strain of back, initial encounter   3. Thigh contusion, left, initial encounter       MDM  Patient with a recent injury or trauma-as in a twisting or torsion contusion to his left lower extremity. Exam is somewhat disproportionate to clinical findings. Given patient's appearance we have performed s x-rays ruling out dislocations or fractures. Patient will be prescribed ultram encouraged to follow-up with primary care doctor (adult center) or orthopedic doctor pain was to persist.   Patient upon discharge, describes that he has also had previous reactions to Ultram ( as in difficulty breathing when asked about potential reactions or medical attention at that time he said he never seek medical attention and used use a hand-held inhaler from a neighbor). Patient resistant and argumentative, about taking Ultram.   At no point in time patient was told he is an "addict". Patient abruptly stood up and a hostile demeanor while he was being discharged by the nurse and myself leaving the premises " Im  going to sue your ass off"  Patient refussed to discuss- other pharmacological alternatives for pain management. As the left the exam room in the exam room table he walked rapidly- without any limp or obviosu discomfort. Refused discharge instructions and prescription.    03/14/13 1922  Jimmie Molly, MD 03/14/13 2006

## 2013-03-14 NOTE — ED Notes (Signed)
Upon reviewing discharge instructions, pt was about to sign name pad when he started staring at Rx; stated, "Oh, Ultram?  Yeah, I can't take that."  When asked why, stated, "I took it once before and had a reaction to it."  When asked what kind of reaction, answered "a seizure".  Asked pt why he didn't mention that or list that as an allergy, he stated, "Well, it's not an allergy."  Informed pt that if he had any kind of adverse reaction it should be listed as something he cannot take.  When questioned about it giving him a seizure, then stated, "Well, I don't know.  It was some kind of neuro thing or something."  When asked, "You don't know what kind of reaction you had to it?," pt then states, "I don't know what kind of reaction I had to it, it was just bad.  Like, really, really bad."  Informed pt that he could then take Tylenol.  Since he is allergic to IBU and listed "NSAIDS" as an allergy, there were not other options for him except to take Tylenol.  "Well, that's not why I came here - just to get Tylenol."  Notified Dr. Ladon Applebaum of conversation.  Discussion btwn Dr. Ladon Applebaum and pt with RN witness.  Pt began spouting off "You think I'm a drug seeker!" after Dr. Ladon Applebaum simply mentioned that he has been very inconsistent with his medical hx and allergy list.  Pt became very angry, yelling, and left the room quickly without any limp (that he had previously) or difficulty ambulating.  Pt approached another staff member and began complaining to her stating he wanted to talk with the manager; I approached pt in hallway and stated he could call back during business hours and talk with the director if he pleased.  Pt yelled and stormed out of dept, again, without any difficulty ambulating.

## 2013-04-05 ENCOUNTER — Emergency Department (HOSPITAL_COMMUNITY)
Admission: EM | Admit: 2013-04-05 | Discharge: 2013-04-06 | Disposition: A | Discharge status: Rehabilitation Facility | Payer: Self-pay | Attending: Emergency Medicine | Admitting: Emergency Medicine

## 2013-04-05 ENCOUNTER — Encounter (HOSPITAL_COMMUNITY): Payer: Self-pay | Admitting: Emergency Medicine

## 2013-04-05 DIAGNOSIS — M129 Arthropathy, unspecified: Secondary | ICD-10-CM | POA: Insufficient documentation

## 2013-04-05 DIAGNOSIS — B353 Tinea pedis: Secondary | ICD-10-CM | POA: Insufficient documentation

## 2013-04-05 DIAGNOSIS — Z87448 Personal history of other diseases of urinary system: Secondary | ICD-10-CM | POA: Insufficient documentation

## 2013-04-05 DIAGNOSIS — Z008 Encounter for other general examination: Secondary | ICD-10-CM

## 2013-04-05 DIAGNOSIS — Z8659 Personal history of other mental and behavioral disorders: Secondary | ICD-10-CM | POA: Insufficient documentation

## 2013-04-05 DIAGNOSIS — F17200 Nicotine dependence, unspecified, uncomplicated: Secondary | ICD-10-CM | POA: Insufficient documentation

## 2013-04-05 DIAGNOSIS — Z046 Encounter for general psychiatric examination, requested by authority: Secondary | ICD-10-CM | POA: Insufficient documentation

## 2013-04-05 DIAGNOSIS — E119 Type 2 diabetes mellitus without complications: Secondary | ICD-10-CM | POA: Insufficient documentation

## 2013-04-05 HISTORY — DX: Post-traumatic stress disorder, unspecified: F43.10

## 2013-04-05 HISTORY — DX: Bipolar disorder, unspecified: F31.9

## 2013-04-05 LAB — CBC
MCH: 31.7 pg (ref 26.0–34.0)
MCHC: 34.5 g/dL (ref 30.0–36.0)
Platelets: 144 10*3/uL — ABNORMAL LOW (ref 150–400)
RDW: 11.9 % (ref 11.5–15.5)

## 2013-04-05 LAB — COMPREHENSIVE METABOLIC PANEL
ALT: 10 U/L (ref 0–53)
Albumin: 3.5 g/dL (ref 3.5–5.2)
Alkaline Phosphatase: 72 U/L (ref 39–117)
Calcium: 9.4 mg/dL (ref 8.4–10.5)
Potassium: 3.8 mEq/L (ref 3.5–5.1)
Sodium: 141 mEq/L (ref 135–145)
Total Protein: 6 g/dL (ref 6.0–8.3)

## 2013-04-05 LAB — ETHANOL: Alcohol, Ethyl (B): <11 mg/dL (ref 0–11)

## 2013-04-05 LAB — ACETAMINOPHEN LEVEL: Acetaminophen (Tylenol), Serum: <15 ug/mL (ref 10–30)

## 2013-04-05 LAB — RAPID URINE DRUG SCREEN, HOSP PERFORMED
Barbiturates: NOT DETECTED
Benzodiazepines: NOT DETECTED
Cocaine: POSITIVE — AB
Opiates: NOT DETECTED
Tetrahydrocannabinol: POSITIVE — AB

## 2013-04-05 MED ORDER — FLUOCINONIDE-E 0.05 % EX CREA: TOPICAL_CREAM | Freq: Two times a day (BID) | CUTANEOUS | Status: DC

## 2013-04-05 MED ORDER — TERBINAFINE HCL 1 % EX CREA: TOPICAL_CREAM | Freq: Two times a day (BID) | CUTANEOUS | Status: DC

## 2013-04-05 NOTE — ED Notes (Signed)
Results faxed to [facility] 

## 2013-04-05 NOTE — ED Notes (Signed)
Pt adamantly denies use of heroin or OD. Pt states he does give plasma. Foul odor noted. Pt states he can not gain wt, states he can not afford groceries.

## 2013-04-05 NOTE — ED Provider Notes (Signed)
History    This chart was scribed for non-physician practitioner, Arthor Captain, PA-C, working with Vida Roller, MD by Donne Anon, ED Scribe. This patient was seen in room [facility] and the patient's care was started at 1931.   CSN: [id number]  Arrival date & time 04/05/13  1906   First MD Initiated Contact with Patient 04/05/13 1931      Chief Complaint  Patient presents with  . Medical Clearance     The history is provided by the patient. No language interpreter was used.   HPI Comments: Cole Morrison is a 27 y.o. male brought in by [employer], who presents to the Emergency Department needing medical clearance. He is IVC'd by a Natalie from a ACT team. He states his joking about his foot pain being so severe that he wants to kill himself was misinterpreted, which led him to being IVC'd. He reports left foot pain due to a fungal infection. 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 He has tried hydrocortisone and tylenol with little relief.  He denies SI, HI or hallucinations.   Past Medical History  Diagnosis Date  . Arthritis   . Diabetes mellitus without complication Borderline  . Overactive bladder   . PTSD (post-traumatic stress disorder)   . Bipolar 1 disorder     History reviewed. No pertinent past surgical history.  No family history on file.  History  Substance Use Topics  . Smoking status: Current Every Day Smoker -- 0.50 packs/day    Types: Cigarettes  . Smokeless tobacco: Not on file  . Alcohol Use: No      Review of Systems  Psychiatric/Behavioral: Negative for suicidal ideas and hallucinations.  All other systems reviewed and are negative.    Allergies  Ibuprofen and Nsaids  Home Medications   Current Outpatient Rx  Name  Route  Sig  Dispense  Refill  . acetaminophen (TYLENOL) 500 MG tablet   Oral   Take 1,000 mg by mouth 2 (two) times daily as needed for pain.            BP 112/75  Pulse 90   Temp(Src) 98.8 F (37.1 C) (Oral)  Resp 18  Ht 5\' 7"  (1.702 m)  Wt 124 lb (56.246 kg)  BMI 19.42 kg/m2  SpO2 99%  Physical Exam  Nursing note and vitals reviewed. Constitutional: He appears well-developed and well-nourished. No distress.  HENT:  Head: Normocephalic and atraumatic.  Eyes: Conjunctivae are normal.  Neck: Neck supple. No tracheal deviation present.  Cardiovascular: Normal rate, regular rhythm and normal heart sounds.   Pulmonary/Chest: Effort normal and breath sounds normal. No respiratory distress. He has no wheezes. He has no rales.  Abdominal: Soft. He exhibits no distension. There is no tenderness. There is no rebound.  Musculoskeletal: Normal range of motion.  Neurological: He is alert.  Skin: Skin is warm and dry.  Tinea pedis. No maceration. Swelling on toes but able to bend them. No drainage. No obvious signs of infection.  Psychiatric: He has a normal mood and affect. His behavior is normal.    ED Course  Procedures (including critical care time) DIAGNOSTIC STUDIES: Oxygen Saturation is 99% on RA, normal by my interpretation.    COORDINATION OF CARE: 8:50 PM Discussed treatment plan  with pt at bedside and pt agreed to plan.    Labs Reviewed  CBC - Abnormal; Notable for the following:    Platelets 144 (*)  All other components within normal limits  SALICYLATE LEVEL - Abnormal; Notable for the following:    Salicylate Lvl <2.0 (*)    All other components within normal limits  URINE RAPID DRUG SCREEN (HOSP PERFORMED) - Abnormal; Notable for the following:    Cocaine POSITIVE (*)    Tetrahydrocannabinol POSITIVE (*)    All other components within normal limits  GLUCOSE, CAPILLARY - Abnormal; Notable for the following:    Glucose-Capillary 104 (*)    All other components within normal limits  ACETAMINOPHEN LEVEL  COMPREHENSIVE METABOLIC PANEL  ETHANOL   No results found.   No diagnosis found.    MDM  Patient IVC'd from act team at  [facility].  Feels it was a miscommunication. Patient has tinea pedis and foul odor. No signs of active infection. Patient denies illicit drug use but appears to have cocaine and THC IN URINE.  Labs are otherwise unremarkable. D/C with Doam Burrow soaks, lamisil, and lidex for tinea. Patient appears safe for discharge will give the patient medications for his tinea.  I personally performed the services described in this documentation, which was scribed in my presence. The recorded information has been reviewed and is accurate.         Arthor Captain, PA-C 04/06/13 [phone]

## 2013-04-05 NOTE — ED Notes (Signed)
Awaiting meds to be filled by pharmacy

## 2013-04-05 NOTE — ED Notes (Signed)
Pt brought to ED via [facility] for medical clearance, pt is IVC and can return to [place] once medically clear.

## 2013-04-05 NOTE — ED Notes (Signed)
Pt ok to return to [place] per [place]. Prescriptions being filled by our pharmacy.

## 2013-04-06 NOTE — ED Provider Notes (Signed)
Medical screening examination/treatment/procedure(s) were performed by non-physician practitioner and as supervising physician I was immediately available for consultation/collaboration.    Raul Winterhalter D Donabelle Molden, MD 04/06/13 1546 

## 2013-05-08 ENCOUNTER — Encounter (HOSPITAL_COMMUNITY): Payer: Self-pay | Admitting: *Deleted

## 2013-05-08 ENCOUNTER — Emergency Department (HOSPITAL_COMMUNITY)
Admission: EM | Admit: 2013-05-08 | Discharge: 2013-05-08 | Disposition: A | Discharge status: Home / Self Care | Payer: Self-pay | Attending: Emergency Medicine | Admitting: Emergency Medicine

## 2013-05-08 ENCOUNTER — Emergency Department (HOSPITAL_COMMUNITY): Payer: Self-pay

## 2013-05-08 DIAGNOSIS — K2970 Gastritis, unspecified, without bleeding: Secondary | ICD-10-CM | POA: Insufficient documentation

## 2013-05-08 DIAGNOSIS — R110 Nausea: Secondary | ICD-10-CM | POA: Insufficient documentation

## 2013-05-08 DIAGNOSIS — R109 Unspecified abdominal pain: Secondary | ICD-10-CM

## 2013-05-08 DIAGNOSIS — F17200 Nicotine dependence, unspecified, uncomplicated: Secondary | ICD-10-CM | POA: Insufficient documentation

## 2013-05-08 DIAGNOSIS — F4310 Post-traumatic stress disorder, unspecified: Secondary | ICD-10-CM | POA: Insufficient documentation

## 2013-05-08 DIAGNOSIS — M129 Arthropathy, unspecified: Secondary | ICD-10-CM | POA: Insufficient documentation

## 2013-05-08 DIAGNOSIS — K219 Gastro-esophageal reflux disease without esophagitis: Secondary | ICD-10-CM | POA: Insufficient documentation

## 2013-05-08 DIAGNOSIS — R1310 Dysphagia, unspecified: Secondary | ICD-10-CM | POA: Insufficient documentation

## 2013-05-08 DIAGNOSIS — F319 Bipolar disorder, unspecified: Secondary | ICD-10-CM | POA: Insufficient documentation

## 2013-05-08 DIAGNOSIS — E119 Type 2 diabetes mellitus without complications: Secondary | ICD-10-CM | POA: Insufficient documentation

## 2013-05-08 HISTORY — DX: Gastro-esophageal reflux disease without esophagitis: K21.9

## 2013-05-08 LAB — COMPREHENSIVE METABOLIC PANEL
Albumin: 3.7 g/dL (ref 3.5–5.2)
BUN: 8 mg/dL (ref 6–23)
Calcium: 9.6 mg/dL (ref 8.4–10.5)
Creatinine, Ser: 0.81 mg/dL (ref 0.50–1.35)
GFR calc Af Amer: >90 mL/min (ref >90)
Glucose, Bld: 95 mg/dL (ref 70–99)
Potassium: 3.9 mEq/L (ref 3.5–5.1)
Total Protein: 6.5 g/dL (ref 6.0–8.3)

## 2013-05-08 LAB — CBC WITH DIFFERENTIAL/PLATELET
Basophils Relative: 1 % (ref 0–1)
Eosinophils Absolute: 0.2 10*3/uL (ref 0.0–0.7)
Eosinophils Relative: 3 % (ref 0–5)
Hemoglobin: 15.7 g/dL (ref 13.0–17.0)
Lymphs Abs: 2 10*3/uL (ref 0.7–4.0)
MCH: 32.6 pg (ref 26.0–34.0)
MCHC: 34.1 g/dL (ref 30.0–36.0)
MCV: 95.6 fL (ref 78.0–100.0)
Monocytes Relative: 10 % (ref 3–12)
Neutrophils Relative %: 55 % (ref 43–77)
RBC: 4.82 MIL/uL (ref 4.22–5.81)

## 2013-05-08 LAB — LIPASE, BLOOD: Lipase: 41 U/L (ref 11–59)

## 2013-05-08 MED ORDER — ONDANSETRON HCL 4 MG/2ML IJ SOLN
4.0000 mg | Freq: Once | INTRAMUSCULAR | Status: AC
  Administered 2013-05-08: 4 mg via INTRAVENOUS
  Filled 2013-05-08: qty 2

## 2013-05-08 MED ORDER — MORPHINE SULFATE 4 MG/ML IJ SOLN
4.0000 mg | Freq: Once | INTRAMUSCULAR | Status: AC
  Administered 2013-05-08: 4 mg via INTRAVENOUS
  Filled 2013-05-08: qty 1

## 2013-05-08 MED ORDER — SODIUM CHLORIDE 0.9 % IV BOLUS (SEPSIS)
1000.0000 mL | Freq: Once | INTRAVENOUS | Status: AC
  Administered 2013-05-08: 1000 mL via INTRAVENOUS

## 2013-05-08 MED ORDER — SODIUM CHLORIDE 0.9 % IV SOLN
INTRAVENOUS | Status: DC
  Administered 2013-05-08: 18:00:00 via INTRAVENOUS

## 2013-05-08 MED ORDER — FAMOTIDINE 20 MG PO TABS: 20.0000 mg | ORAL_TABLET | Freq: Two times a day (BID) | ORAL | Status: DC

## 2013-05-08 MED ORDER — ESOMEPRAZOLE MAGNESIUM 40 MG PO CPDR: 40.0000 mg | DELAYED_RELEASE_CAPSULE | Freq: Every day | ORAL | Status: DC

## 2013-05-08 MED ORDER — GI COCKTAIL ~~LOC~~
30.0000 mL | Freq: Once | ORAL | Status: AC
  Administered 2013-05-08: 30 mL via ORAL
  Filled 2013-05-08: qty 30

## 2013-05-08 MED ORDER — PANTOPRAZOLE SODIUM 40 MG IV SOLR
40.0000 mg | Freq: Once | INTRAVENOUS | Status: AC
  Administered 2013-05-08: 40 mg via INTRAVENOUS
  Filled 2013-05-08: qty 40

## 2013-05-08 NOTE — ED Provider Notes (Signed)
History    CSN: [id number] Arrival date & time 05/08/13  1605  First MD Initiated Contact with Patient 05/08/13 1608     No chief complaint on file.  (Consider location/radiation/quality/duration/timing/severity/associated sxs/prior Treatment) HPI  27 year old male with history of bipolar, PTSD, and borderline diabetes presents for evaluation of abdominal pain. Patient reports gradual onset of epigastric pain for the past 3 days. Describe pain as sharp and burning sensation, nonradiating, with associate nausea and gagging. Symptoms seem to be worsened after eating. Especially eating hot spicy food. Patient has appetite but afraid to eat. Symptoms not associated with fever, chills, sore throat, chest pain, shortness of breath, back pain, dysuria, diarrhea,  hematochezia, hematemesis, or melena. He denies any recent alcohol use. Patient also reports using prilosec to treats GERD but no relief.  He denies any rash, and no recent trauma.  No hx of abd surgery.    Past Medical History  Diagnosis Date  . Arthritis   . Diabetes mellitus without complication Borderline  . Overactive bladder   . PTSD (post-traumatic stress disorder)   . Bipolar 1 disorder    No past surgical history on file. No family history on file. History  Substance Use Topics  . Smoking status: Current Every Day Smoker -- 0.50 packs/day    Types: Cigarettes  . Smokeless tobacco: Not on file  . Alcohol Use: No    Review of Systems  All other systems reviewed and are negative.    Allergies  Ibuprofen and Nsaids  Home Medications   Current Outpatient Rx  Name  Route  Sig  Dispense  Refill  . acetaminophen (TYLENOL) 500 MG tablet   Oral   Take 1,000 mg by mouth 2 (two) times daily as needed for pain.          . fluocinonide-emollient (LIDEX-E) 0.05 % cream   Topical   Apply topically 2 (two) times daily.   30 g   0   . terbinafine (LAMISIL AT) 1 % cream   Topical   Apply topically 2 (two) times  daily.   30 g   0    There were no vitals taken for this visit. Physical Exam  Nursing note and vitals reviewed. Constitutional: He appears well-developed and well-nourished. No distress.  Awake, alert, nontoxic appearance  HENT:  Head: Atraumatic.  Mouth/Throat: Oropharynx is clear and moist.  Eyes: Conjunctivae are normal. Right eye exhibits no discharge. Left eye exhibits no discharge.  Neck: Normal range of motion. Neck supple.  Cardiovascular: Normal rate and regular rhythm.   Pulmonary/Chest: Effort normal. No respiratory distress. He exhibits no tenderness.  Abdominal: Soft. Bowel sounds are normal. He exhibits no distension. There is tenderness (tenderness to epigastric with guarding but without rebound tenderness. No Murphy's sign, no McBurney's point, no overlying skin changes.). There is no rebound.  Musculoskeletal: He exhibits no tenderness.  ROM appears intact, no obvious focal weakness  Neurological: He is alert.  Skin: Skin is warm and dry. No rash noted.  Psychiatric: He has a normal mood and affect.    ED Course  Procedures (including critical care time)  4:32 PM Epigastric abd pain suggestive of gastritis vs. PUD, vs biliary disease vs. Pancreatitis.  Work up initiated.  Doubt cardiopulmonary disease.    5:59 PM Nausea has improved, however pain still persists. We'll continue with management.  6:18 PM Patient felt much better. His labs are reassuring. Symptoms likely worsening of his GERD. Will discharge patient with PPI, H2-blocker, instruction to  avoid eating late, and also to avoid acidic food or spicy food. Patient will benefit from followup with a regular Dr. for further management. Return precautions discussed.   Labs Reviewed  CBC WITH DIFFERENTIAL  COMPREHENSIVE METABOLIC PANEL  LIPASE, BLOOD   Dg Chest 2 View  05/08/2013   *RADIOLOGY REPORT*  Clinical Data: Epigastric pain.  Check for hiatal hernia.  CHEST - 2 VIEW  Comparison: 10/24/2012   Findings: The lungs are hyperinflated.  Mildly prominent interstitial markings are noted.  No focal consolidations or pleural effusions identified.  No retrocardiac density to suggest presence of hiatal hernia. Visualized osseous structures have a normal appearance.  IMPRESSION: No evidence for acute cardiopulmonary abnormality.   Original Report Authenticated By: Norva Pavlov, M.D.   1. Abdominal pain   2. Gastritis     MDM  BP 120/67  Pulse 106  Temp(Src) 98.5 F (36.9 C) (Oral)  Resp 16  SpO2 99%  I have reviewed nursing notes and vital signs. I personally reviewed the imaging tests through PACS system  I reviewed available ER/hospitalization records thought the EMR   Fayrene Helper, [place] 05/08/13 1821

## 2013-05-08 NOTE — Progress Notes (Signed)
Patient reports his pcp is Dr. Anner Crete.  Also, patient reports that he will be getting insurance through his employment soon.  EDCM provided list of financial resources in community churches [employer], list of discount pharmacies such as [facility], [facility] etc, provided and website needymeds.org for help with medication.  Patient thankful for resources.

## 2013-05-08 NOTE — ED Notes (Signed)
Pt reports abd pain x3 days, described as "burning". Denies n/v/d, but has been "gagging" and has not been eating well. States he thinks he may have food poisoning

## 2013-05-09 NOTE — ED Provider Notes (Signed)
Medical screening examination/treatment/procedure(s) were performed by non-physician practitioner and as supervising physician I was immediately available for consultation/collaboration.   Ashby Dawes, MD 05/09/13 [phone]

## 2013-08-01 ENCOUNTER — Ambulatory Visit: Payer: Self-pay

## 2013-09-19 ENCOUNTER — Encounter (HOSPITAL_COMMUNITY): Payer: Self-pay | Admitting: Emergency Medicine

## 2013-09-19 ENCOUNTER — Emergency Department (HOSPITAL_COMMUNITY)
Admission: EM | Admit: 2013-09-19 | Discharge: 2013-09-20 | Disposition: A | Discharge status: Home / Self Care | Payer: Self-pay | Attending: Emergency Medicine | Admitting: Emergency Medicine

## 2013-09-19 DIAGNOSIS — Z8739 Personal history of other diseases of the musculoskeletal system and connective tissue: Secondary | ICD-10-CM | POA: Insufficient documentation

## 2013-09-19 DIAGNOSIS — K759 Inflammatory liver disease, unspecified: Secondary | ICD-10-CM

## 2013-09-19 DIAGNOSIS — Z8619 Personal history of other infectious and parasitic diseases: Secondary | ICD-10-CM | POA: Insufficient documentation

## 2013-09-19 DIAGNOSIS — R259 Unspecified abnormal involuntary movements: Secondary | ICD-10-CM | POA: Insufficient documentation

## 2013-09-19 DIAGNOSIS — Z79899 Other long term (current) drug therapy: Secondary | ICD-10-CM | POA: Insufficient documentation

## 2013-09-19 DIAGNOSIS — F319 Bipolar disorder, unspecified: Secondary | ICD-10-CM | POA: Insufficient documentation

## 2013-09-19 DIAGNOSIS — F4310 Post-traumatic stress disorder, unspecified: Secondary | ICD-10-CM | POA: Insufficient documentation

## 2013-09-19 DIAGNOSIS — F17200 Nicotine dependence, unspecified, uncomplicated: Secondary | ICD-10-CM | POA: Insufficient documentation

## 2013-09-19 DIAGNOSIS — Z8719 Personal history of other diseases of the digestive system: Secondary | ICD-10-CM | POA: Insufficient documentation

## 2013-09-19 DIAGNOSIS — F909 Attention-deficit hyperactivity disorder, unspecified type: Secondary | ICD-10-CM | POA: Insufficient documentation

## 2013-09-19 DIAGNOSIS — F1910 Other psychoactive substance abuse, uncomplicated: Secondary | ICD-10-CM | POA: Insufficient documentation

## 2013-09-19 DIAGNOSIS — R110 Nausea: Secondary | ICD-10-CM | POA: Insufficient documentation

## 2013-09-19 DIAGNOSIS — IMO0001 Reserved for inherently not codable concepts without codable children: Secondary | ICD-10-CM | POA: Insufficient documentation

## 2013-09-19 HISTORY — DX: Inflammatory liver disease, unspecified: K75.9

## 2013-09-19 LAB — RAPID URINE DRUG SCREEN, HOSP PERFORMED
Barbiturates: NOT DETECTED
Benzodiazepines: NOT DETECTED
Cocaine: NOT DETECTED

## 2013-09-19 LAB — COMPREHENSIVE METABOLIC PANEL
ALT: 13 U/L (ref 0–53)
Alkaline Phosphatase: 83 U/L (ref 39–117)
BUN: 8 mg/dL (ref 6–23)
CO2: 28 mEq/L (ref 19–32)
Chloride: 103 mEq/L (ref 96–112)
Creatinine, Ser: 0.69 mg/dL (ref 0.50–1.35)
GFR calc Af Amer: >90 mL/min (ref >90)
GFR calc non Af Amer: >90 mL/min (ref >90)
Glucose, Bld: 81 mg/dL (ref 70–99)
Potassium: 3.6 mEq/L (ref 3.5–5.1)
Sodium: 141 mEq/L (ref 135–145)
Total Bilirubin: 0.5 mg/dL (ref 0.3–1.2)
Total Protein: 7.3 g/dL (ref 6.0–8.3)

## 2013-09-19 LAB — CBC
HCT: 45.1 % (ref 39.0–52.0)
Hemoglobin: 15.5 g/dL (ref 13.0–17.0)
RBC: 4.75 MIL/uL (ref 4.22–5.81)
WBC: 9.7 10*3/uL (ref 4.0–10.5)

## 2013-09-19 LAB — ETHANOL: Alcohol, Ethyl (B): <11 mg/dL (ref 0–11)

## 2013-09-19 MED ORDER — THIAMINE HCL 100 MG/ML IJ SOLN: 100.0000 mg | Freq: Every day | INTRAMUSCULAR | Status: DC

## 2013-09-19 MED ORDER — NICOTINE 21 MG/24HR TD PT24: 21.0000 mg | MEDICATED_PATCH | Freq: Every day | TRANSDERMAL | Status: DC

## 2013-09-19 MED ORDER — LORAZEPAM 1 MG PO TABS: 1.0000 mg | ORAL_TABLET | Freq: Three times a day (TID) | ORAL | Status: DC | PRN

## 2013-09-19 MED ORDER — ALUM & MAG HYDROXIDE-SIMETH 200-200-20 MG/5ML PO SUSP: 30.0000 mL | ORAL | Status: DC | PRN

## 2013-09-19 MED ORDER — ZOLPIDEM TARTRATE 5 MG PO TABS: 5.0000 mg | ORAL_TABLET | Freq: Every evening | ORAL | Status: DC | PRN

## 2013-09-19 MED ORDER — LORAZEPAM 1 MG PO TABS: 0.0000 mg | ORAL_TABLET | Freq: Two times a day (BID) | ORAL | Status: DC

## 2013-09-19 MED ORDER — VITAMIN B-1 100 MG PO TABS: 100.0000 mg | ORAL_TABLET | Freq: Every day | ORAL | Status: DC

## 2013-09-19 MED ORDER — ONDANSETRON HCL 4 MG PO TABS
4.0000 mg | ORAL_TABLET | Freq: Three times a day (TID) | ORAL | Status: DC | PRN
  Administered 2013-09-19: 4 mg via ORAL
  Filled 2013-09-19: qty 1

## 2013-09-19 MED ORDER — LORAZEPAM 1 MG PO TABS: 0.0000 mg | ORAL_TABLET | Freq: Four times a day (QID) | ORAL | Status: DC
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
## 2013-09-19 NOTE — ED Notes (Signed)
MD at Bedside.

## 2013-09-19 NOTE — ED Provider Notes (Signed)
CSN: [id number]     Arrival date & time 09/19/13  1518 History   First MD Initiated Contact with Patient 09/19/13 1533     Chief Complaint  Patient presents with  . Medical Clearance   (Consider location/radiation/quality/duration/timing/severity/associated sxs/prior Treatment) The history is provided by the patient and medical records. No language interpreter was used.    Cabe Lashley is a 27 y.o. male  with a hx of DM (uncontrolled), PTDS, bipolar, polysubstance abuse, Hep B and C presents to the Emergency Department requesting detox. Associated symptoms include sweats, shakes, N/V have seizures none in the last 6 months with attempts to withdraw.  Patient reports daily alcohol usage including moonshine, running alcohol and Listerine. He reports injecting heroin, opana, morphine, Dilaudid and any other opiate he can find.  Patient reports he needs medical detox.  Past Medical History  Diagnosis Date  . Arthritis   . Diabetes mellitus without complication Borderline  . Overactive bladder   . PTSD (post-traumatic stress disorder)   . Bipolar 1 disorder   . Acid reflux   . Hepatitis 09/19/2013    Type B & C   History reviewed. No pertinent past surgical history. History reviewed. No pertinent family history. History  Substance Use Topics  . Smoking status: Current Every Day Smoker -- 0.50 packs/day    Types: Cigarettes  . Smokeless tobacco: Never Used  . Alcohol Use: No    Review of Systems  Constitutional: Negative for fever, appetite change and fatigue.  Respiratory: Negative for cough, chest tightness and shortness of breath.   Cardiovascular: Negative for chest pain.  Gastrointestinal: Negative for nausea, vomiting, abdominal pain and diarrhea.  Genitourinary: Negative for dysuria, urgency and frequency.  Musculoskeletal: Negative for arthralgias, myalgias and neck stiffness.  Skin: Negative for rash.  Neurological: Negative for light-headedness and headaches.   Psychiatric/Behavioral: Negative for suicidal ideas, hallucinations, sleep disturbance, self-injury and agitation. The patient is not nervous/anxious.        Drug abuse  All other systems reviewed and are negative.    Allergies  Ibuprofen and Nsaids  Home Medications   Current Outpatient Rx  Name  Route  Sig  Dispense  Refill  . ARIPiprazole (ABILIFY) 2 MG tablet   Oral   Take 2 mg by mouth daily.          BP 110/69  Pulse 70  Temp(Src) 98.3 F (36.8 C) (Oral)  Resp 19  Wt 131 lb (59.421 kg)  SpO2 100% Physical Exam  Nursing note and vitals reviewed. Constitutional: He appears well-developed and well-nourished. No distress.  Awake, alert, nontoxic appearance  HENT:  Head: Normocephalic and atraumatic.  Mouth/Throat: Oropharynx is clear and moist. No oropharyngeal exudate.  Eyes: Conjunctivae are normal. No scleral icterus.  Neck: Normal range of motion. Neck supple.  Cardiovascular: Normal rate, regular rhythm, normal heart sounds and intact distal pulses.   No murmur heard. Pulmonary/Chest: Effort normal and breath sounds normal. No respiratory distress. He has no wheezes.  Abdominal: Soft. Bowel sounds are normal. He exhibits no mass. There is no tenderness. There is no rebound and no guarding.  Musculoskeletal: Normal range of motion. He exhibits no edema.  Neurological: He is alert.  Speech is clear and goal oriented Moves extremities without ataxia  Skin: Skin is warm and dry. He is not diaphoretic.  Psychiatric: His speech is rapid and/or pressured. He is hyperactive. He expresses no homicidal and no suicidal ideation. He expresses no suicidal plans and no homicidal plans.  Pt  with manic, tangential and pressured speech Denies SI/HI    ED Course  Procedures (including critical care time) Labs Review Labs Reviewed  URINE RAPID DRUG SCREEN (HOSP PERFORMED) - Abnormal; Notable for the following:    Opiates POSITIVE (*)    Tetrahydrocannabinol POSITIVE (*)     All other components within normal limits  CBC  COMPREHENSIVE METABOLIC PANEL  ETHANOL   Imaging Review No results found.  EKG Interpretation   None       MDM   1. Polysubstance abuse      Burlon Centrella presents for detox.  Patient has been medically cleared in the ED and is awaiting consult by TTS team for possible placement vs OP clinic information. Pt is currently not having SI or HI and appears stable in NAD. Pt is cleared to be moved back to Pod C for psyc evaluation. Pt has a hx of detox at [facility] but relapsed after being discharged.    11:23 PM Pt has been seen by TTS.  Pt remains without SI/HI.  He is refusing any facility that will not provide suboxone and methadone for treatment and there are no facilities of this nature.  Discussed with Elijah Birk and will discuss this with the patient.    12:00 AM  Discussed at length with patient.  He reports he does not want any type of detox therapy it is no involvement down or Suboxone.  He states he is a long-time user and he has a stash at home for when he decides to relapse.  Tom spoke with him about cutting himself up to fail he states he doesn't hear if he fails.  He reports he is unwilling to try detox with clonidine.  He continues to adamantly deny suicidal ideations, homicidal ideations, auditory or visual hallucinations.  Patient is alert, oriented, nontoxic and nonseptic appearing.  While here in the department he has had no tremors, diaphoresis nausea or vomiting.  He is tolerating by mouth without difficulty at this time.  His alcohol was negative.  Patient is afebrile and not tachycardiac.  He has no cardiac murmur.  He has no leukocytosis.  No evidence of endocarditis at this time.  There is no indication the patient is in acute withdrawal from alcohol.  His vitals are stable and he is hemodynamically intact.  Patient is competent to make this decision.  He has the capacity to understand that leaving without treatment well and  evidently cause a relapse and more drug use his.  He can't repeat back to me my concerns about his safety and states he understands that if he keeps using drugs he will die.  Patient unwilling to stay for further treatment.  He is to be discharged with outpatient resources.  BP 110/69  Pulse 70  Temp(Src) 98.3 F (36.8 C) (Oral)  Resp 19  Wt 131 lb (59.421 kg)  SpO2 100%.  Dahlia Client Milanie Rosenfield, PA-C 09/20/13 0004  Osborne Serio, PA-C 09/20/13 0006

## 2013-09-19 NOTE — ED Notes (Signed)
Social worker in room with patient.

## 2013-09-19 NOTE — BH Assessment (Signed)
[facility] Assessment Progress Note  At 20:07 I spoke to [facility] in anticipation of TTS assessment scheduled for 20:20.  Doylene Canning, MA Triage Specialist 09/19/2013 @ 20:09

## 2013-09-19 NOTE — ED Notes (Signed)
Pt in requesting medical clearance so that he can enter a detox facility for heroin use, last use was this morning

## 2013-09-19 NOTE — ED Notes (Addendum)
Pt reports that he has been doing heroine x 6 years. Has never been clean. States that he recently sold all of his furniture for drug money, "my ex girlfriend just had my baby last night and moved to [place] to get away from me" . Reports also drinking alcohol x 13 years including rubbing alcohol, vanilla extract and mouth wash. Reports last drinking last night.

## 2013-09-19 NOTE — ED Notes (Addendum)
Donato Schultz (Team lead social worker) [phone] Would like to be notified when placed in new location.

## 2013-09-20 NOTE — ED Notes (Signed)
Spoke to [employer] MSW for Pt. Given update on Pt. Condition. Aware he is being d/c'd. Will pick him up in lobby.

## 2013-09-20 NOTE — ED Notes (Signed)
Spoke to Pt. He does not want any type of detox therapy if it does not involve methadone or suboxone. He states that he is a long time user and has drugs already stashed at home should he fail at his attempt to get sober. I questioned him about whether or not this would be setting himself up for failure by taking this approach to which he really didn't care. He would just call his wife and have her come pick him up and take him home. He asked how we treat opiate addiction to which I said we usually treat the symptoms with meds such as clonidine. He was quick to say that did not work for him it only dropped his pressure. When i questioned him further how he knew what meds worked for him since he had never been sober or detox ed he became defensive and ended the conversation.

## 2013-09-20 NOTE — ED Notes (Deleted)
Spoke to Pt. He does not want any type of detox therapy if it does not involve methadone or suboxone. He states that he is a long time user and has drugs already stashed at home should he fail at his attempt to get sober. I questioned him about whether or not this would be setting himself up for failure by taking this approach to which he really didn't care. He would just call his wife and have her come pick him up and take him home. He asked how we treat opiate addiction to which I said we usually treat the symptoms with meds such as clonidine. He was quick to say that did not work for him it only dropped his pressure. When i questioned him further how he knew what meds worked for him since he had never been sober or detox ed he became defensive and ended the conversation. 

## 2013-09-20 NOTE — BH Assessment (Signed)
[facility] Assessment Progress Note  At 23:15 I responded to a message left by pt's nurse, Zella Ball.  She reports that pt is refusing any treatment that does not include Suboxone or methadone for opiate withdrawal.  After consulting with Donell Sievert, PA, it has been determined that pt should be given referrals to the outpatient substance abuse programs at [facility] [phone]) and the [facility] [phone]), since there are no known inpatient or residential programs that provide Suboxone or methadone.  At 23:22 I spoke to EDP Dahlia Client to inform her of this.  After speaking to the pt, she called back at 23:50, concurring with this decision.  I then spoke again to Zella Ball and provided her with the names and phone numbers for both of these programs.  Doylene Canning, MA Triage Specialist 09/20/2013 2 00:31

## 2013-09-20 NOTE — ED Provider Notes (Signed)
Medical screening examination/treatment/procedure(s) were performed by non-physician practitioner and as supervising physician I was immediately available for consultation/collaboration.  EKG Interpretation   None        Glynn Octave, MD 09/20/13 [phone]

## 2013-11-03 ENCOUNTER — Emergency Department (HOSPITAL_COMMUNITY): Payer: Self-pay

## 2013-11-03 ENCOUNTER — Encounter (HOSPITAL_COMMUNITY): Payer: Self-pay | Admitting: Emergency Medicine

## 2013-11-03 ENCOUNTER — Emergency Department (HOSPITAL_COMMUNITY)
Admission: EM | Admit: 2013-11-03 | Discharge: 2013-11-03 | Disposition: A | Discharge status: Home / Self Care | Payer: Self-pay | Attending: Emergency Medicine | Admitting: Emergency Medicine

## 2013-11-03 DIAGNOSIS — N509 Disorder of male genital organs, unspecified: Secondary | ICD-10-CM | POA: Insufficient documentation

## 2013-11-03 DIAGNOSIS — E119 Type 2 diabetes mellitus without complications: Secondary | ICD-10-CM | POA: Insufficient documentation

## 2013-11-03 DIAGNOSIS — Z8739 Personal history of other diseases of the musculoskeletal system and connective tissue: Secondary | ICD-10-CM | POA: Insufficient documentation

## 2013-11-03 DIAGNOSIS — F17200 Nicotine dependence, unspecified, uncomplicated: Secondary | ICD-10-CM | POA: Insufficient documentation

## 2013-11-03 DIAGNOSIS — F4310 Post-traumatic stress disorder, unspecified: Secondary | ICD-10-CM | POA: Insufficient documentation

## 2013-11-03 DIAGNOSIS — Z79899 Other long term (current) drug therapy: Secondary | ICD-10-CM | POA: Insufficient documentation

## 2013-11-03 DIAGNOSIS — R110 Nausea: Secondary | ICD-10-CM | POA: Insufficient documentation

## 2013-11-03 DIAGNOSIS — R1013 Epigastric pain: Secondary | ICD-10-CM | POA: Insufficient documentation

## 2013-11-03 DIAGNOSIS — F319 Bipolar disorder, unspecified: Secondary | ICD-10-CM | POA: Insufficient documentation

## 2013-11-03 DIAGNOSIS — R630 Anorexia: Secondary | ICD-10-CM | POA: Insufficient documentation

## 2013-11-03 DIAGNOSIS — K219 Gastro-esophageal reflux disease without esophagitis: Secondary | ICD-10-CM | POA: Insufficient documentation

## 2013-11-03 DIAGNOSIS — Z8619 Personal history of other infectious and parasitic diseases: Secondary | ICD-10-CM | POA: Insufficient documentation

## 2013-11-03 HISTORY — DX: Gastritis, unspecified, without bleeding: K29.70

## 2013-11-03 LAB — CBC WITH DIFFERENTIAL/PLATELET
BASOS ABS: 0 10*3/uL (ref 0.0–0.1)
Basophils Relative: 1 % (ref 0–1)
EOS ABS: 0.2 10*3/uL (ref 0.0–0.7)
Eosinophils Relative: 3 % (ref 0–5)
HCT: 43.7 % (ref 39.0–52.0)
Hemoglobin: 15 g/dL (ref 13.0–17.0)
LYMPHS ABS: 2.7 10*3/uL (ref 0.7–4.0)
LYMPHS PCT: 38 % (ref 12–46)
MCH: 32.1 pg (ref 26.0–34.0)
MCHC: 34.3 g/dL (ref 30.0–36.0)
MCV: 93.6 fL (ref 78.0–100.0)
Monocytes Absolute: 0.5 10*3/uL (ref 0.1–1.0)
Monocytes Relative: 7 % (ref 3–12)
NEUTROS PCT: 51 % (ref 43–77)
Neutro Abs: 3.6 10*3/uL (ref 1.7–7.7)
PLATELETS: 152 10*3/uL (ref 150–400)
RBC: 4.67 MIL/uL (ref 4.22–5.81)
RDW: 12 % (ref 11.5–15.5)
WBC: 7.1 10*3/uL (ref 4.0–10.5)

## 2013-11-03 LAB — COMPREHENSIVE METABOLIC PANEL
ALK PHOS: 111 U/L (ref 39–117)
ALT: 15 U/L (ref 0–53)
AST: 23 U/L (ref 0–37)
Albumin: 3.9 g/dL (ref 3.5–5.2)
BUN: 7 mg/dL (ref 6–23)
CO2: 25 meq/L (ref 19–32)
Calcium: 9.1 mg/dL (ref 8.4–10.5)
Chloride: 100 mEq/L (ref 96–112)
Creatinine, Ser: 0.83 mg/dL (ref 0.50–1.35)
GFR calc non Af Amer: >90 mL/min (ref >90)
GLUCOSE: 94 mg/dL (ref 70–99)
POTASSIUM: 3.9 meq/L (ref 3.7–5.3)
SODIUM: 137 meq/L (ref 137–147)
TOTAL PROTEIN: 7.3 g/dL (ref 6.0–8.3)
Total Bilirubin: 0.2 mg/dL — ABNORMAL LOW (ref 0.3–1.2)

## 2013-11-03 LAB — URINALYSIS, ROUTINE W REFLEX MICROSCOPIC
BILIRUBIN URINE: NEGATIVE
Glucose, UA: NEGATIVE mg/dL
Hgb urine dipstick: NEGATIVE
Ketones, ur: NEGATIVE mg/dL
Leukocytes, UA: NEGATIVE
Nitrite: NEGATIVE
PH: 7 (ref 5.0–8.0)
Protein, ur: NEGATIVE mg/dL
Specific Gravity, Urine: 1.002 — ABNORMAL LOW (ref 1.005–1.030)
UROBILINOGEN UA: 0.2 mg/dL (ref 0.0–1.0)

## 2013-11-03 LAB — LIPASE, BLOOD: Lipase: 40 U/L (ref 11–59)

## 2013-11-03 MED ORDER — ONDANSETRON HCL 4 MG PO TABS: 4.0000 mg | ORAL_TABLET | Freq: Four times a day (QID) | ORAL | Status: DC

## 2013-11-03 MED ORDER — SODIUM CHLORIDE 0.9 % IV BOLUS (SEPSIS)
1000.0000 mL | Freq: Once | INTRAVENOUS | Status: AC
  Administered 2013-11-03: 1000 mL via INTRAVENOUS

## 2013-11-03 MED ORDER — PANTOPRAZOLE SODIUM 40 MG IV SOLR
40.0000 mg | Freq: Once | INTRAVENOUS | Status: AC
  Administered 2013-11-03: 40 mg via INTRAVENOUS
  Filled 2013-11-03: qty 40

## 2013-11-03 MED ORDER — HYDROMORPHONE HCL PF 1 MG/ML IJ SOLN
1.0000 mg | Freq: Once | INTRAMUSCULAR | Status: AC
  Administered 2013-11-03: 1 mg via INTRAVENOUS
  Filled 2013-11-03: qty 1

## 2013-11-03 MED ORDER — PANTOPRAZOLE SODIUM 20 MG PO TBEC: 20.0000 mg | DELAYED_RELEASE_TABLET | Freq: Every day | ORAL | Status: DC

## 2013-11-03 MED ORDER — ONDANSETRON HCL 4 MG/2ML IJ SOLN
4.0000 mg | Freq: Once | INTRAMUSCULAR | Status: AC
  Administered 2013-11-03: 4 mg via INTRAVENOUS
  Filled 2013-11-03: qty 2

## 2013-11-03 NOTE — ED Notes (Signed)
Pt reports hx gastritis and states his stomach has been hurting for past few days unrelieved with maalox and pepcid. He also noticed a knot on his R testicle that is painful to touch. Denies swelling or urinary changes

## 2013-11-03 NOTE — ED Notes (Signed)
Pt not in room at this time

## 2013-11-03 NOTE — ED Notes (Signed)
Pt st's no relief from pain med.  St's nausea has subsided.

## 2013-11-03 NOTE — ED Notes (Signed)
Pt c/o abd pain onset this am st's he has hx of gastritis.  St's has been vomiting since yesterday.  Also st's he has a knot on right testicle

## 2013-11-03 NOTE — ED Provider Notes (Signed)
CSN: [id number]     Arrival date & time 11/03/13  1325 History   First MD Initiated Contact with Patient 11/03/13 1811     Chief Complaint  Patient presents with  . Abdominal Pain   (Consider location/radiation/quality/duration/timing/severity/associated sxs/prior Treatment) Patient is a 28 y.o. male presenting with abdominal pain and male genitourinary complaint. The history is provided by the patient. No language interpreter was used.  Abdominal Pain Pain location:  Epigastric Pain quality: burning and sharp   Pain radiates to:  Does not radiate Pain severity:  Severe Onset quality:  Unable to specify Duration:  1 day Timing:  Constant Progression:  Waxing and waning Chronicity:  Recurrent Context: eating   Context: not recent illness, not sick contacts and not suspicious food intake   Relieved by:  Nothing Worsened by:  Eating Ineffective treatments:  OTC medications Associated symptoms: anorexia and nausea   Associated symptoms: no chest pain, no chills, no constipation, no cough, no diarrhea, no dysuria, no fatigue, no fever, no shortness of breath, no sore throat and no vomiting   Male GU Problem Presenting symptoms: scrotal pain   Presenting symptoms: no dysuria   Scrotal pain:    Affected testicle:  Left   Severity:  Moderate   Onset quality:  Unable to specify   Duration:  6 months   Timing:  Constant   Progression:  Worsening   Chronicity:  New Context: spontaneously   Relieved by:  Nothing Worsened by:  Nothing tried Ineffective treatments:  None tried Associated symptoms: abdominal pain and nausea   Associated symptoms: no diarrhea, no fever and no vomiting     Past Medical History  Diagnosis Date  . Arthritis   . Diabetes mellitus without complication Borderline  . Overactive bladder   . PTSD (post-traumatic stress disorder)   . Bipolar 1 disorder   . Acid reflux   . 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 Gastritis    History reviewed. No pertinent  past surgical history. History reviewed. No pertinent family history. History  Substance Use Topics  . Smoking status: Current Every Day Smoker -- 0.50 packs/day    Types: Cigarettes  . Smokeless tobacco: Never Used  . Alcohol Use: No    Review of Systems  Constitutional: Negative for fever, chills, activity change, appetite change and fatigue.  HENT: Negative for congestion, facial swelling, rhinorrhea, sore throat and trouble swallowing.   Eyes: Negative for photophobia and pain.  Respiratory: Negative for cough, chest tightness and shortness of breath.   Cardiovascular: Negative for chest pain and leg swelling.  Gastrointestinal: Positive for nausea, abdominal pain and anorexia. Negative for vomiting, diarrhea and constipation.  Endocrine: Negative for polydipsia and polyuria.  Genitourinary: Positive for testicular pain. Negative for dysuria, urgency, decreased urine volume and difficulty urinating.  Musculoskeletal: Negative for back pain and gait problem.  Skin: Negative for color change, rash and wound.  Allergic/Immunologic: Negative for immunocompromised state.  Neurological: Negative for dizziness, facial asymmetry, speech difficulty, weakness, numbness and headaches.  Psychiatric/Behavioral: Negative for confusion, decreased concentration and agitation.    Allergies  Ibuprofen and Nsaids  Home Medications   Current Outpatient Rx  Name  Route  Sig  Dispense  Refill  . ARIPiprazole (ABILIFY) 2 MG tablet   Oral   Take 2 mg by mouth daily.         . ondansetron (ZOFRAN) 4 MG tablet   Oral   Take 1 tablet (4 mg total) by mouth  every 6 (six) hours.   12 tablet   0   . pantoprazole (PROTONIX) 20 MG tablet   Oral   Take 1 tablet (20 mg total) by mouth daily.   30 tablet   0    BP 111/77  Pulse 80  Temp(Src) 98.3 F (36.8 C) (Oral)  Resp 16  Wt 126 lb (57.153 kg)  SpO2 100% Physical Exam  Constitutional: He is oriented to person, place, and time. He  appears well-developed and well-nourished. No distress.  HENT:  Head: Normocephalic and atraumatic.  Mouth/Throat: No oropharyngeal exudate.  Eyes: Pupils are equal, round, and reactive to light.  Neck: Normal range of motion. Neck supple.  Cardiovascular: Normal rate, regular rhythm and normal heart sounds.  Exam reveals no gallop and no friction rub.   No murmur heard. Pulmonary/Chest: Effort normal and breath sounds normal. No respiratory distress. He has no wheezes. He has no rales.  Abdominal: Soft. Bowel sounds are normal. He exhibits no distension and no mass. There is tenderness in the epigastric area. There is no rigidity, no rebound and no guarding.  Genitourinary:    Right testis shows mass and tenderness. Left testis shows no mass, no swelling and no tenderness. Left testis is descended. Cremasteric reflex is not absent on the left side.  Musculoskeletal: Normal range of motion. He exhibits no edema and no tenderness.  Neurological: He is alert and oriented to person, place, and time.  Skin: Skin is warm and dry.  Psychiatric: He has a normal mood and affect.    ED Course  Procedures (including critical care time) Labs Review Labs Reviewed  COMPREHENSIVE METABOLIC PANEL - Abnormal; Notable for the following:    Total Bilirubin 0.2 (*)    All other components within normal limits  URINALYSIS, ROUTINE W REFLEX MICROSCOPIC - Abnormal; Notable for the following:    Specific Gravity, Urine 1.002 (*)    All other components within normal limits  CBC WITH DIFFERENTIAL  LIPASE, BLOOD   Imaging Review US Scrotum  11/03/2013   ADDENDUM REPORT: 11/03/2013 20:17  ADDENDUM: In discussion of the case with Dr. Dr. Darlys Gales it was noted that the symptoms were actually on the right and not on the left. Review of the images does not reveal an abnormal intratesticular or scrotal mass on the right.   Electronically Signed   By: David  [place]   On: 11/03/2013 20:17   11/03/2013   CLINICAL  DATA:  Left-sided testicular pain, questionable scrotal mass  EXAM: SCROTAL ULTRASOUND  DOPPLER ULTRASOUND OF THE TESTICLES  TECHNIQUE: Complete ultrasound examination of the testicles, epididymis, and other scrotal structures was performed. Color and spectral Doppler ultrasound were also utilized to evaluate blood flow to the testicles.  COMPARISON:  None.  FINDINGS: Right testicle  Measurements: 4.3 x 2.4 x 2.5 cm. The echotexture of the right testicle is within the limits of normal. Vascularity of the right testicle also is normal. No mass or microlithiasis visualized.  Left testicle  Measurements: 3.6 x 2.4 x 2.3 cm. The echotexture of the left testicle is within the limits of normal. Vascularity of the left testicle is normal. No mass or microlithiasis visualized.  Right epididymis:  Normal in size and appearance.  Left epididymis:  Normal in size and appearance.  Hydrocele:  None visualized.  Varicocele:  None visualized.  Pulsed Doppler interrogation of both testes demonstrates low resistance arterial and venous waveforms bilaterally.  IMPRESSION: 1. There is no evidence of an intra testicular mass nor  of testicular torsion. 2. There are no findings suspicious for acute epididymitis. 3. No scrotal mass or hydrocele or varicocele is demonstrated.  Electronically Signed: By: David  [place] On: 11/03/2013 20:05   [place] Art/ven Flow Abd Pelv Doppler  11/03/2013   ADDENDUM REPORT: 11/03/2013 20:17  ADDENDUM: In discussion of the case with Dr. Dr. Darlys Gales it was noted that the symptoms were actually on the right and not on the left. Review of the images does not reveal an abnormal intratesticular or scrotal mass on the right.   Electronically Signed   By: David  [place]   On: 11/03/2013 20:17   11/03/2013   CLINICAL DATA:  Left-sided testicular pain, questionable scrotal mass  EXAM: SCROTAL ULTRASOUND  DOPPLER ULTRASOUND OF THE TESTICLES  TECHNIQUE: Complete ultrasound examination of the testicles, epididymis, and  other scrotal structures was performed. Color and spectral Doppler ultrasound were also utilized to evaluate blood flow to the testicles.  COMPARISON:  None.  FINDINGS: Right testicle  Measurements: 4.3 x 2.4 x 2.5 cm. The echotexture of the right testicle is within the limits of normal. Vascularity of the right testicle also is normal. No mass or microlithiasis visualized.  Left testicle  Measurements: 3.6 x 2.4 x 2.3 cm. The echotexture of the left testicle is within the limits of normal. Vascularity of the left testicle is normal. No mass or microlithiasis visualized.  Right epididymis:  Normal in size and appearance.  Left epididymis:  Normal in size and appearance.  Hydrocele:  None visualized.  Varicocele:  None visualized.  Pulsed Doppler interrogation of both testes demonstrates low resistance arterial and venous waveforms bilaterally.  IMPRESSION: 1. There is no evidence of an intra testicular mass nor of testicular torsion. 2. There are no findings suspicious for acute epididymitis. 3. No scrotal mass or hydrocele or varicocele is demonstrated.  Electronically Signed: By: David  [place] On: 11/03/2013 20:05    EKG Interpretation   None       MDM   1. Epigastric pain    Pt is a 28 y.o. male with Pmhx as above who presents with burning/sharp epigastric pain w/ nausea, sour taste in mouth, similar to multiple prior episodes since being diagnosed w/ gastritis. He takes no scheduled GI meds. Pt has also had 6 months of a tender, firm nodule on R testicle w/o other GU complaints. CBC, CMP, lipase, UA unremarkable. Pt given dilaudid, protonix, zofran and states he feels no better but clinically looks to be comfortable in NAD, speaking calmly.  Doubt acute surgical emergency such as SBO, cholecystitis and suspect gastritis. Will start on scheduled protonix and have asked him to establish with a PCP.    US scrotum done which was nml.  I called radiology to confirm there were not findings on R  testicle.  Will have him f/u with urology as outpt.  Return precautions given for new or worsening symptoms including worsening pain, fever.      Shanna Cisco, MD 11/04/13 1140

## 2013-11-03 NOTE — Discharge Instructions (Signed)
Scrotal Masses Scrotal swelling is common in men of all ages. Common types of testicular masses include:   Hydrocele. The most common benign testicular mass in an adult. Hydroceles are generally soft and painless collections of fluid in the scrotal sac. These can rapidly change size as the fluid enters or leaves. Hydroceles can be associated with an underlying cancer of the testicle.  Spermatoceles. Generally soft and painless cyst-like masses in the scrotum that contain fluid, usually above the testicle. They can rapidly change size as the fluid enters or leaves. They are more prominent while standing or exercising. Sometimes, spermatoceles may cause a sensation of heaviness or a dull ache.  Orchitis. Inflammation of the testicle. It is painful and may be associated with a fever or symptoms of a urinary tract infection, including frequent and painful urination. It is common in males who have the mumps.  Varicocele. An enlargement of the veins that drain the testicles. Varicoceles usually occur on the left side of the scrotum. This condition can increase the risk of infertility. Varicocele is sometimes more prominent while standing or exercising. Sometimes, varicoceles may cause a sensation of heaviness or a dull ache.  Inguinal hernia. A bulge caused by a portion of intestine protruding into the scrotum through a weak area in the abdominal muscles. Hernias may or may not be painful. They are soft and usually enlarge with coughing or straining.  Torsion of the testis. This can cause a testicular mass that develops quickly and is associated with tenderness or fever, or both. It is caused by a twisting of the testicle within the sac. It also reduces the blood supply and can destroy the testis if not treated quickly with surgery.  Epididymitis. Inflammation of the epididymis (a structure attached above and behind the testicle), usually caused by a urinary tract infection or a sexually transmitted  infection. This generally shows up as testicular discomfort and swelling and may include pain during urination. It is frequently associated with a testicle infection.  Testicular appendages. Remnants of tissue on the testis present since birth. A testicular appendage can twist on its blood supply and cause pain. In most cases, this is seen as a blue dot on the scrotum.  Hematocele. A collection of blood between the layers of the sac inside the scrotum. It usually is caused by trauma to the scrotum.  Sebaceous cysts. These can be a swelling in the skin of the scrotum and are usually painless.  Cancer (carcinoma) of the skin of the scrotum. It can cause scrotal swelling, but this is rare. Document Released: 04/11/2003 Document Revised: 06/07/2013 Document Reviewed: 03/27/2013 [facility] Patient Information 2014 [place], [place]. Gastritis, Adult Gastritis is soreness and swelling (inflammation) of the lining of the stomach. Gastritis can develop as a sudden onset (acute) or long-term (chronic) condition. If gastritis is not treated, it can lead to stomach bleeding and ulcers. CAUSES  Gastritis occurs when the stomach lining is weak or damaged. Digestive juices from the stomach then inflame the weakened stomach lining. The stomach lining may be weak or damaged due to viral or bacterial infections. One common bacterial infection is the Helicobacter pylori infection. Gastritis can also result from excessive alcohol consumption, taking certain medicines, or having too much acid in the stomach.  SYMPTOMS  In some cases, there are no symptoms. When symptoms are present, they may include:  Pain or a burning sensation in the upper abdomen.  Nausea.  Vomiting.  An uncomfortable feeling of fullness after eating. DIAGNOSIS  Your  caregiver may suspect you have gastritis based on your symptoms and a physical exam. To determine the cause of your gastritis, your caregiver may perform the following:  Blood or  stool tests to check for the H pylori bacterium.  Gastroscopy. A thin, flexible tube (endoscope) is passed down the esophagus and into the stomach. The endoscope has a light and camera on the end. Your caregiver uses the endoscope to view the inside of the stomach.  Taking a tissue sample (biopsy) from the stomach to examine under a microscope. TREATMENT  Depending on the cause of your gastritis, medicines may be prescribed. If you have a bacterial infection, such as an H pylori infection, antibiotics may be given. If your gastritis is caused by too much acid in the stomach, H2 blockers or antacids may be given. Your caregiver may recommend that you stop taking aspirin, ibuprofen, or other nonsteroidal anti-inflammatory drugs (NSAIDs). HOME CARE INSTRUCTIONS  Only take over-the-counter or prescription medicines as directed by your caregiver.  If you were given antibiotic medicines, take them as directed. Finish them even if you start to feel better.  Drink enough fluids to keep your urine clear or pale yellow.  Avoid foods and drinks that make your symptoms worse, such as:  Caffeine or alcoholic drinks.  Chocolate.  Peppermint or mint flavorings.  Garlic and onions.  Spicy foods.  Citrus fruits, such as oranges, lemons, or limes.  Tomato-based foods such as sauce, chili, salsa, and pizza.  Fried and fatty foods.  Eat small, frequent meals instead of large meals. SEEK IMMEDIATE MEDICAL CARE IF:   You have black or dark red stools.  You vomit blood or material that looks like coffee grounds.  You are unable to keep fluids down.  Your abdominal pain gets worse.  You have a fever.  You do not feel better after 1 week.  You have any other questions or concerns. MAKE SURE YOU:  Understand these instructions.  Will watch your condition.  Will get help right away if you are not doing well or get worse. Document Released: 09/29/2001 Document Revised: 04/05/2012 Document  Reviewed: 11/18/2011 [facility] Patient Information 2014 [place]

## 2014-01-15 ENCOUNTER — Encounter (HOSPITAL_COMMUNITY): Payer: Self-pay | Admitting: *Deleted

## 2014-01-15 ENCOUNTER — Emergency Department (HOSPITAL_COMMUNITY)
Admission: EM | Admit: 2014-01-15 | Discharge: 2014-01-15 | Disposition: A | Discharge status: Home / Self Care | Payer: Self-pay | Attending: Emergency Medicine | Admitting: Emergency Medicine

## 2014-01-15 ENCOUNTER — Encounter (HOSPITAL_COMMUNITY): Payer: Self-pay | Admitting: Emergency Medicine

## 2014-01-15 ENCOUNTER — Inpatient Hospital Stay (HOSPITAL_COMMUNITY)
Admission: AD | Admit: 2014-01-15 | Discharge: 2014-01-17 | DRG: 897 | Disposition: A | Discharge status: Home / Self Care | Payer: Federal, State, Local not specified - Other | Source: Intra-hospital | Attending: Psychiatry | Admitting: Psychiatry

## 2014-01-15 DIAGNOSIS — K219 Gastro-esophageal reflux disease without esophagitis: Secondary | ICD-10-CM | POA: Diagnosis present

## 2014-01-15 DIAGNOSIS — F1110 Opioid abuse, uncomplicated: Secondary | ICD-10-CM | POA: Insufficient documentation

## 2014-01-15 DIAGNOSIS — F3130 Bipolar disorder, current episode depressed, mild or moderate severity, unspecified: Secondary | ICD-10-CM | POA: Diagnosis present

## 2014-01-15 DIAGNOSIS — Z8719 Personal history of other diseases of the digestive system: Secondary | ICD-10-CM | POA: Insufficient documentation

## 2014-01-15 DIAGNOSIS — F1120 Opioid dependence, uncomplicated: Principal | ICD-10-CM

## 2014-01-15 DIAGNOSIS — E119 Type 2 diabetes mellitus without complications: Secondary | ICD-10-CM | POA: Insufficient documentation

## 2014-01-15 DIAGNOSIS — F1994 Other psychoactive substance use, unspecified with psychoactive substance-induced mood disorder: Secondary | ICD-10-CM | POA: Diagnosis present

## 2014-01-15 DIAGNOSIS — F410 Panic disorder [episodic paroxysmal anxiety] without agoraphobia: Secondary | ICD-10-CM | POA: Diagnosis present

## 2014-01-15 DIAGNOSIS — F17200 Nicotine dependence, unspecified, uncomplicated: Secondary | ICD-10-CM | POA: Diagnosis present

## 2014-01-15 DIAGNOSIS — G4700 Insomnia, unspecified: Secondary | ICD-10-CM | POA: Diagnosis present

## 2014-01-15 DIAGNOSIS — IMO0002 Reserved for concepts with insufficient information to code with codable children: Secondary | ICD-10-CM | POA: Insufficient documentation

## 2014-01-15 DIAGNOSIS — Z8619 Personal history of other infectious and parasitic diseases: Secondary | ICD-10-CM | POA: Insufficient documentation

## 2014-01-15 DIAGNOSIS — M129 Arthropathy, unspecified: Secondary | ICD-10-CM | POA: Diagnosis present

## 2014-01-15 DIAGNOSIS — F319 Bipolar disorder, unspecified: Secondary | ICD-10-CM

## 2014-01-15 DIAGNOSIS — F1020 Alcohol dependence, uncomplicated: Secondary | ICD-10-CM | POA: Diagnosis present

## 2014-01-15 DIAGNOSIS — Z87820 Personal history of traumatic brain injury: Secondary | ICD-10-CM

## 2014-01-15 DIAGNOSIS — Z8739 Personal history of other diseases of the musculoskeletal system and connective tissue: Secondary | ICD-10-CM | POA: Insufficient documentation

## 2014-01-15 DIAGNOSIS — F329 Major depressive disorder, single episode, unspecified: Secondary | ICD-10-CM | POA: Diagnosis present

## 2014-01-15 DIAGNOSIS — F1010 Alcohol abuse, uncomplicated: Secondary | ICD-10-CM

## 2014-01-15 DIAGNOSIS — F4310 Post-traumatic stress disorder, unspecified: Secondary | ICD-10-CM | POA: Diagnosis present

## 2014-01-15 DIAGNOSIS — L03114 Cellulitis of left upper limb: Secondary | ICD-10-CM

## 2014-01-15 DIAGNOSIS — F1910 Other psychoactive substance abuse, uncomplicated: Secondary | ICD-10-CM | POA: Diagnosis present

## 2014-01-15 DIAGNOSIS — Z87448 Personal history of other diseases of urinary system: Secondary | ICD-10-CM | POA: Insufficient documentation

## 2014-01-15 DIAGNOSIS — F411 Generalized anxiety disorder: Secondary | ICD-10-CM | POA: Diagnosis present

## 2014-01-15 DIAGNOSIS — Z8659 Personal history of other mental and behavioral disorders: Secondary | ICD-10-CM | POA: Insufficient documentation

## 2014-01-15 DIAGNOSIS — F1210 Cannabis abuse, uncomplicated: Secondary | ICD-10-CM | POA: Diagnosis present

## 2014-01-15 LAB — COMPREHENSIVE METABOLIC PANEL
ALT: 22 U/L (ref 0–53)
AST: 38 U/L — AB (ref 0–37)
Albumin: 4.2 g/dL (ref 3.5–5.2)
Alkaline Phosphatase: 104 U/L (ref 39–117)
BILIRUBIN TOTAL: 0.7 mg/dL (ref 0.3–1.2)
BUN: 11 mg/dL (ref 6–23)
CALCIUM: 10.5 mg/dL (ref 8.4–10.5)
CHLORIDE: 94 meq/L — AB (ref 96–112)
CO2: 22 meq/L (ref 19–32)
CREATININE: 0.85 mg/dL (ref 0.50–1.35)
GLUCOSE: 89 mg/dL (ref 70–99)
Potassium: 4.4 mEq/L (ref 3.7–5.3)
Sodium: 136 mEq/L — ABNORMAL LOW (ref 137–147)
Total Protein: 8.3 g/dL (ref 6.0–8.3)

## 2014-01-15 LAB — CBC WITH DIFFERENTIAL/PLATELET
Basophils Absolute: 0 10*3/uL (ref 0.0–0.1)
Basophils Relative: 0 % (ref 0–1)
EOS ABS: 0.1 10*3/uL (ref 0.0–0.7)
EOS PCT: 1 % (ref 0–5)
HEMATOCRIT: 51.9 % (ref 39.0–52.0)
HEMOGLOBIN: 19 g/dL — AB (ref 13.0–17.0)
LYMPHS ABS: 3.4 10*3/uL (ref 0.7–4.0)
Lymphocytes Relative: 43 % (ref 12–46)
MCH: 33.6 pg (ref 26.0–34.0)
MCHC: 36.6 g/dL — ABNORMAL HIGH (ref 30.0–36.0)
MCV: 91.7 fL (ref 78.0–100.0)
MONOS PCT: 12 % (ref 3–12)
Monocytes Absolute: 1 10*3/uL (ref 0.1–1.0)
Neutro Abs: 3.6 10*3/uL (ref 1.7–7.7)
Neutrophils Relative %: 44 % (ref 43–77)
Platelets: 148 10*3/uL — ABNORMAL LOW (ref 150–400)
RBC: 5.66 MIL/uL (ref 4.22–5.81)
RDW: 12.8 % (ref 11.5–15.5)
WBC: 8.1 10*3/uL (ref 4.0–10.5)

## 2014-01-15 LAB — RAPID URINE DRUG SCREEN, HOSP PERFORMED
Amphetamines: NOT DETECTED
BARBITURATES: NOT DETECTED
BENZODIAZEPINES: NOT DETECTED
Cocaine: NOT DETECTED
Opiates: POSITIVE — AB
Tetrahydrocannabinol: POSITIVE — AB

## 2014-01-15 LAB — ETHANOL: Alcohol, Ethyl (B): <11 mg/dL (ref 0–11)

## 2014-01-15 MED ORDER — LOPERAMIDE HCL 2 MG PO CAPS: 2.0000 mg | ORAL_CAPSULE | ORAL | Status: DC | PRN

## 2014-01-15 MED ORDER — ACETAMINOPHEN 325 MG PO TABS: 650.0000 mg | ORAL_TABLET | Freq: Four times a day (QID) | ORAL | Status: DC | PRN

## 2014-01-15 MED ORDER — SULFAMETHOXAZOLE-TRIMETHOPRIM 800-160 MG PO TABS: 1.0000 | ORAL_TABLET | Freq: Two times a day (BID) | ORAL | Status: DC

## 2014-01-15 MED ORDER — CLONIDINE HCL 0.1 MG PO TABS
0.1000 mg | ORAL_TABLET | Freq: Four times a day (QID) | ORAL | Status: DC
  Administered 2014-01-15 – 2014-01-16 (×4): 0.1 mg via ORAL
  Filled 2014-01-15 (×10): qty 1

## 2014-01-15 MED ORDER — CLONIDINE HCL 0.1 MG PO TABS
0.1000 mg | ORAL_TABLET | Freq: Three times a day (TID) | ORAL | Status: DC | PRN
  Administered 2014-01-15: 0.1 mg via ORAL
  Filled 2014-01-15: qty 1

## 2014-01-15 MED ORDER — MAGNESIUM HYDROXIDE 400 MG/5ML PO SUSP: 30.0000 mL | Freq: Every day | ORAL | Status: DC | PRN

## 2014-01-15 MED ORDER — CLONIDINE HCL 0.1 MG PO TABS: 0.1000 mg | ORAL_TABLET | Freq: Three times a day (TID) | ORAL | Status: DC | PRN

## 2014-01-15 MED ORDER — SULFAMETHOXAZOLE-TMP DS 800-160 MG PO TABS
1.0000 | ORAL_TABLET | Freq: Once | ORAL | Status: AC
  Administered 2014-01-15: 1 via ORAL
  Filled 2014-01-15: qty 1

## 2014-01-15 MED ORDER — METHOCARBAMOL 500 MG PO TABS
500.0000 mg | ORAL_TABLET | Freq: Three times a day (TID) | ORAL | Status: DC | PRN
  Administered 2014-01-15 – 2014-01-16 (×2): 500 mg via ORAL
  Filled 2014-01-15 (×2): qty 1

## 2014-01-15 MED ORDER — HYDROXYZINE HCL 25 MG PO TABS
25.0000 mg | ORAL_TABLET | Freq: Four times a day (QID) | ORAL | Status: DC | PRN
  Administered 2014-01-15 – 2014-01-16 (×3): 25 mg via ORAL
  Filled 2014-01-15 (×3): qty 1

## 2014-01-15 MED ORDER — DICYCLOMINE HCL 20 MG PO TABS
20.0000 mg | ORAL_TABLET | Freq: Four times a day (QID) | ORAL | Status: DC | PRN
  Administered 2014-01-15 – 2014-01-16 (×2): 20 mg via ORAL
  Filled 2014-01-15 (×2): qty 1

## 2014-01-15 MED ORDER — NICOTINE 21 MG/24HR TD PT24
21.0000 mg | MEDICATED_PATCH | Freq: Every day | TRANSDERMAL | Status: DC
  Administered 2014-01-15 – 2014-01-17 (×3): 21 mg via TRANSDERMAL
  Filled 2014-01-15 (×4): qty 1

## 2014-01-15 MED ORDER — ALUM & MAG HYDROXIDE-SIMETH 200-200-20 MG/5ML PO SUSP: 30.0000 mL | ORAL | Status: DC | PRN

## 2014-01-15 MED ORDER — SODIUM CHLORIDE 0.9 % IV BOLUS (SEPSIS)
1000.0000 mL | Freq: Once | INTRAVENOUS | Status: AC
  Administered 2014-01-15: 1000 mL via INTRAVENOUS

## 2014-01-15 MED ORDER — TRAZODONE HCL 50 MG PO TABS
50.0000 mg | ORAL_TABLET | Freq: Every evening | ORAL | Status: DC | PRN
Start: 2014-01-15 — End: 2014-01-16
  Administered 2014-01-15: 50 mg via ORAL
  Filled 2014-01-15 (×4): qty 1

## 2014-01-15 MED ORDER — ONDANSETRON HCL 4 MG/2ML IJ SOLN
4.0000 mg | Freq: Once | INTRAMUSCULAR | Status: AC
  Administered 2014-01-15: 4 mg via INTRAVENOUS
  Filled 2014-01-15: qty 2

## 2014-01-15 MED ORDER — ONDANSETRON 4 MG PO TBDP
4.0000 mg | ORAL_TABLET | Freq: Four times a day (QID) | ORAL | Status: DC | PRN
  Administered 2014-01-15 – 2014-01-16 (×3): 4 mg via ORAL
  Filled 2014-01-15 (×3): qty 1

## 2014-01-15 MED ORDER — CLONIDINE HCL 0.1 MG PO TABS
0.1000 mg | ORAL_TABLET | ORAL | Status: DC
  Filled 2014-01-15 (×2): qty 1

## 2014-01-15 MED ORDER — CLONIDINE HCL 0.1 MG PO TABS: 0.1000 mg | ORAL_TABLET | Freq: Every day | ORAL | Status: DC

## 2014-01-15 NOTE — ED Notes (Signed)
Talked with behavioral health, telepsych machine at the bedside

## 2014-01-15 NOTE — ED Notes (Signed)
Case Manager at the bedside

## 2014-01-15 NOTE — ED Notes (Signed)
Pt presents with unable to eat, drink, and insomnia. Pt states he has "probably overdosed on Dilaudid 8 mg and Oxycodone 10 mg 2-3 times 1 week ago." Pt states "I cant go home, I need to be medically admitted for detox, I don't have any money for prescriptions so I need to be admitted and treated here."  Pt states he last used IV Heroin Saturday afternoon, pt is unsure of the actual time. Pt reports he uses 1/2 gram of Heroin per day x8-10 years.  Pt states "it's causing me severe medical problems." Pt denies detox in the past but states "I really need to."

## 2014-01-15 NOTE — ED Notes (Signed)
PT HAS SIGNED CONSENT TO GO TO BH WHEN A BED IS AVAILABLE

## 2014-01-15 NOTE — BH Assessment (Signed)
Spoke with Dr Criss AlvineGoldston at 9:22 AM before calling to do teleassessment. Carney Bernatherine C Harrill, LCSW

## 2014-01-15 NOTE — ED Notes (Signed)
PT REPORTS HE HASNT BEEN ABLE TO EAT OR DRINK SINCE Saturday. STATES HE IS IN OPIATE WITHDRAWAL. STATES HE DOESN'T HAVE THE MONEY TO GET ANY DRUGS AND IS HAVING PROBLEMS FINDING THEM. HE ALSO STATES AT THE SAME TIME HE WANTS TO QUIT USING(WITH A HALF SMILE). [facility]

## 2014-01-15 NOTE — ED Notes (Signed)
[facility] SO PT CAN TRANSFER OVER. HE WILL CALL WHEN ROOM IS READY

## 2014-01-15 NOTE — Tx Team (Signed)
Initial Interdisciplinary Treatment Plan  PATIENT STRENGTHS: (choose at least two) Average or above average intelligence Capable of independent living Communication skills Supportive family/friends  PATIENT STRESSORS: Financial difficulties Occupational concerns Substance abuse   PROBLEM LIST: Problem List/Patient Goals Date to be addressed Date deferred Reason deferred Estimated date of resolution  Polysubstance Abuse 01/15/2014     Suicidal Ideation 01/15/2014                                                DISCHARGE CRITERIA:  Ability to meet basic life and health needs Improved stabilization in mood, thinking, and/or behavior Motivation to continue treatment in a less acute level of care Verbal commitment to aftercare and medication compliance Withdrawal symptoms are absent or subacute and managed without 24-hour nursing intervention  PRELIMINARY DISCHARGE PLAN: Attend 12-step recovery group Return to previous living arrangement  PATIENT/FAMIILY INVOLVEMENT: This treatment plan has been presented to and reviewed with the patient, Cole Morrison.  The patient and family have been given the opportunity to ask questions and make suggestions.  Cole Morrison, Cole Morrison 01/15/2014, 3:54 PM
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
## 2014-01-15 NOTE — BH Assessment (Signed)
Tele assessment completed at 10:05 AM; patient information and current status ran with both Jacquelyne BalintShalita Forrest, RN  And Nani SkillernJohn Conrad Withrow FNP at 10:15 AM. [facility]. Dr Criss AlvineGoldston advised at 10:20 AM of disposition and patient's RN Aliene AltesLindsay Brooks advised at 10:23 AM. RN agreed to inform patient of [facility] bed becomes upon which is expected later today.  JC Withrow FNP will be putting in orders for patient.  Carney Bernatherine C Harrill, LCSW

## 2014-01-15 NOTE — BH Assessment (Signed)
Assured by Dustin FlockAnnette Andrews patient's RN that she will obtain signatures for release and voluntary admnit and [facility] before [facility]. Carney Bernatherine C Viki Carrera, LCSW

## 2014-01-15 NOTE — ED Notes (Signed)
The pt is compalining of weakness hyperventilating and he last used heroine  Last pm.  He is going through withdrawal

## 2014-01-15 NOTE — ED Notes (Signed)
[facility] CALLED TO TRANSPORT PT 

## 2014-01-15 NOTE — ED Notes (Signed)
Pt reports at this time that he has had suicide attempts in the past couple of days. States that he tried to overdose on heroin. Pt also has some superficial cuts to the forearms. Pt changed into paper scrubs and wanded by security. House coverage made aware of pt status. Charge nurse informed.

## 2014-01-15 NOTE — Discharge Instructions (Signed)
See the resource list for follow up appointment for your substance abuse    Emergency Department Resource Guide 1) Find a Doctor and Pay Out of Pocket Although you won't have to find out who is covered by your insurance plan, it is a good idea to ask around and get recommendations. You will then need to call the office and see if the doctor you have chosen will accept you as a new patient and what types of options they offer for patients who are self-pay. Some doctors offer discounts or will set up payment plans for their patients who do not have insurance, but you will need to ask so you aren't surprised when you get to your appointment.  2) Contact Your Local Health Department Not all health departments have doctors that can see patients for sick visits, but many do, so it is worth a call to see if yours does. If you don't know where your local health department is, you can check in your phone book. The CDC also has a tool to help you locate your state's health department, and many state websites also have listings of all of their local health departments.  3) Find a Walk-in Clinic If your illness is not likely to be very severe or complicated, you may want to try a walk in clinic. These are popping up all over the country in pharmacies, drugstores, and shopping centers. They're usually staffed by nurse practitioners or physician assistants that have been trained to treat common illnesses and complaints. They're usually fairly quick and inexpensive. However, if you have serious medical issues or chronic medical problems, these are probably not your best option.  No Primary Care Doctor: - Call Health Connect at  [phone] - they can help you locate a primary care doctor that  accepts your insurance, provides certain services, etc. - Physician Referral Service- [phone]  Chronic Pain Problems: Organization         Address  Phone   Notes  [facility]  [phone]  Patients need to be referred by their primary care doctor.   Medication Assistance: Organization         Address  Phone   Notes  [facility] [address] [place], [place] [id number] [phone]) [phone] --Must be a resident of [place] -- Must have NO insurance coverage whatsoever (no Medicaid/ Medicare, etc.) -- The pt. MUST have a primary care doctor that directs their care regularly and follows them in the community   MedAssist  [phone]   [employer]  [phone]    Agencies that provide inexpensive medical care: Organization         Address  Phone   Notes  [facility]  [phone]   [facility]    [phone]   [facility] [address] [place], [place] [address] [phone]   [facility] [address], [place] [phone]) [phone]   [facility]    [phone]) [phone]   [facility]    [phone]) [phone]   [facility]  [address], [place] Phone:  [phone], Fax:  [phone] Hours of Operation:  9 am - 6 pm, M-F.  Also accepts Medicaid/Medicare and self-pay.  [facility]  [address], [place] Phone: [phone], Fax: [phone]. Hours of Operation:  8:30 am -  5:30 pm, M-F.  Also accepts Medicaid and self-pay.  [facility] [place] [address], [place] Phone: [phone]   [place], [place], [place] [phone], Ext. [phone] Mondays & Thursdays: 7-9 AM.  First 15 patients are seen on a first come, first serve basis.    [place] Providers:  Organization         Address  Phone   Notes  [facility] [address], [place] [phone] Also accepts self-pay patients.  [facility] [id number] [place], [place]  [phone]   [place], [address], [place] [phone]   [facility] [address] [place] [phone]   Lucianne Lei [address], [place]   [phone] Only accepts [place] Access [place] patients after they have their name applied to their card.   Self-Pay (no insurance) in [facility]:  Organization         Address  Phone   Notes  Sickle Cell Patients, [facility] [place] [phone]   [facility] [place] [phone]   [facility] [place]  [place], [place], [place] [phone]   [facility]/Dr. Osei-Bonsu  [address], [place] or [address], [place] [phone] Phone number for both [place] and [place] locations is the same.  [facility] [address] [place] [phone]   [facility] [address] [place] or [address] [phone]   [facility] [address], [place] [phone], phone; [phone], fax Sees patients 1st and 3rd Saturday of every month.  Must not qualify for public or private insurance (i.e. Medicaid, Medicare, [place] Health Choice, Veterans' Benefits)  Household income should be no more than 200% of the poverty level The clinic cannot treat you if you are pregnant or think you are pregnant  Sexually transmitted diseases are not treated at the clinic.    Dental Care: Organization         Address  Phone  Notes  [facility] [place] [phone] Accepts children up to age 47 who are enrolled in [place] or [place]; pregnant women with a Medicaid card; and children who have applied for Medicaid or [place] Health Choice, but were  declined, whose parents can pay a reduced fee at time of service.  [facility]  [address], [place] [phone] Accepts children up to age 4 who are enrolled in [place] or [place]; pregnant women with a Medicaid card; and children who have applied for Medicaid or [place] Health Choice, but were declined, whose parents can pay a reduced fee at time of service.  [facility]  [place] [phone] Patients are seen by appointment only. Walk-ins are not accepted. [place] will see patients 45 years of age and older. Monday - Tuesday (8am-5pm) Most Wednesdays (8:30-5pm) $30 per visit, cash only  [facility]  [address], [place] [phone] Patients are seen by appointment only. Walk-ins are  not accepted. [place] will see patients 44 years of age and older. One Wednesday Evening (Monthly: Volunteer Based).  $30 per visit, cash only  [place]  [phone] for adults; Children under age 9, call Graduate Pediatric Dentistry at [phone]. Children aged 15-14, please call [phone] to request a pediatric application.  Dental services are provided in all areas of dental care including fillings, crowns and bridges, complete and partial dentures, implants, gum treatment, root canals, and extractions. Preventive care is also provided. Treatment is provided to both adults and children. Patients are selected via a lottery and there is often a waiting list.   [facility] [address], [place]  [phone] [email or web address]   [facility] [address] [place], [place] [phone], Ext. [phone] Second and Fourth Thursday of each month, opens at 6:30 AM; Clinic ends at 9 AM.  Patients are seen on a first-come first-served basis, and a limited number are seen during each clinic.    [facility]  [address] Hillard Danker [place], [place] [phone]   Eligibility Requirements You must have lived in [place], [place], or [place] counties for at least the last three months.   You cannot be eligible for state or federal sponsored [employer], including [employer], [place], or [employer].   You generally cannot be eligible for healthcare insurance through your employer.    How to apply: Eligibility screenings are held every Tuesday and Wednesday afternoon from 1:00 pm until 4:00 pm. You do not need an appointment for the interview!  [facility] [address] [place], [place]   [place]  [place] Department  [place]  [phone]    Behavioral Health Resources in the Community: Intensive Outpatient Programs Organization         Address  Phone  Notes  [facility] [place] [facility] [place]. [address], [place], [place] [phone]   [facility] [address] [place], [place]   [facility]: [facility] [address] [place], [place]   [place] [address],  [place], [place] or [phone]   Substance Abuse Resources Organization         Address  Phone  Notes  [facility] Drug Services  [phone]   [place]  [phone]   [facility] [place]   Chinita Pester  [phone]   Residential & Outpatient Substance Abuse Program  [phone]   Psychological Services Organization         Address  Phone  Notes  [facility] [place]  [place]  [phone]   [place] [address], [place] or [phone]    Mobile Crisis Teams Organization         Address  Phone  Notes  [facility], Mobile Crisis Care  Unit  [phone]   [facility]  [address] [place], [place]   [place] Levels [address], [place] [phone]    Self-Help/Support Groups Organization         Address  Phone             Notes  [place]. of [place] - variety of support groups  [place] Call for more information  Narcotics Anonymous (NA),  [facility] [address], °[place]  2 meetings at this location  ° °Residential Treatment Programs °Organization         Address  Phone  Notes  °[facility] [address],    °[place]  [phone]   °[facility] ° [address], [place], [place] [phone]   °[facility] [address], [place] [phone] Admissions: 8am-3pm M-F  °[facility] [address]    °[place], [place] [phone]   °[facility] [address], [place], [place] [phone]   °[facility] [address]  °[place], [place] [phone]   °[facility] - Intensive Outpatient [address], [place], [place] [phone]   °[facility] [address]  °[place], [place] 1-877-615-2722 or [phone]   °[facility] ([facility]) [address] [place], [place] [phone] Accepts Medicaid  °[facility] [address]  ° [place] 1-800-659-3381 Substance Abuse/Addiction Treatment  ° °[place] Behavioral Health Resources °Organization         Address  Phone  Notes  °[facility]  ([phone]) [phone]   °Julie Brannon, PhD [address] [place], [place]   ([phone]) [phone] or (336) [phone]   °[facility]   [address] °[place], [place] ([phone]) [phone]   °[facility] [address], [place], [place] ([phone]) [phone] Insurance/Medicaid/sponsorship through [facility]  °[facility] [address]                                     [place], [place] [phone] Therapy/tele-psych/case  °[facility] [address]  ° [place],  ([phone]) [phone]    °Dr. Arfeen  ([phone]) [phone]   °[facility] [place]  [employer] [place] [facility] 1) [address], [place] °2) [address], [place] °3)  [address], [place] (336) [phone] °(336) [phone] ° °(336) [phone]   °[place] Child Abuse Hotline (336) 342-1394 or (336) 342-3537 (After Hours)    ° ° ° °

## 2014-01-15 NOTE — Progress Notes (Signed)
Pt observed lying in his bed with eyes closed.  Pt reports he is feeling bad and achy all over.  Pt was given prns for w/d and nausea.  Pt given ginger ale at this time and informed of meds available with his protocol.  Pt was encouraged to make his needs known to staff.  Pt denies SI/HI/AV at this time.  Support and encouragement offered.  Meds given as ordered.  Safety maintained with q15 minute checks.

## 2014-01-15 NOTE — ED Provider Notes (Signed)
CSN: [id number]     Arrival date & time 01/15/14  0529 History   First MD Initiated Contact with Patient 01/15/14 [phone]     Chief Complaint  Patient presents with  . heroin withdrawal      (Consider location/radiation/quality/duration/timing/severity/associated sxs/prior Treatment) HPI Comments: 28 year old male with a history of chronic narcotic opiate abuse who presents with a complaint of opiate withdrawal. He states that his last use was approximately 36 hours ago. He uses IV drugs, in [place] in his left antecubital fossa and has noticed a small amount of redness there. He denies fevers but has had chills, abdominal pain, diarrhea, abdominal cramping, nausea and has not had anything to eat in over 24 hours. In addition to using intravenous heroin he also uses oral Dilaudid and OxyContin which he buys off the street. He denies ever going to detox and states he has no intention of doing that at this time though he does want to stop using drugs. The patient denies cocaine use, alcohol use but does use marijuana and tobacco  The history is provided by the patient and a relative.    Past Medical History  Diagnosis Date  . Arthritis   . Diabetes mellitus without complication Borderline  . Overactive bladder   . PTSD (post-traumatic stress disorder)   . Bipolar 1 disorder   . Acid reflux   . 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 Gastritis    History reviewed. No pertinent past surgical history. No family history on file. History  Substance Use Topics  . Smoking status: Current Every Day Smoker -- 0.50 packs/day    Types: Cigarettes  . Smokeless tobacco: Never Used  . Alcohol Use: No    Review of Systems  All other systems reviewed and are negative.      Allergies  Ibuprofen and Nsaids  Home Medications   Current Outpatient Rx  Name  Route  Sig  Dispense  Refill  . hydrOXYzine (ATARAX/VISTARIL) 25 MG tablet   Oral   Take 25 mg by mouth at bedtime as needed (for  sleep).          BP 113/88  Pulse 120  Temp(Src) 98.2 F (36.8 C)  Resp 28  Ht 5\' 7"  (1.702 m)  Wt 127 lb (57.607 kg)  BMI 19.89 kg/m2  SpO2 100% Physical Exam  Nursing note and vitals reviewed. Constitutional: He appears well-developed and well-nourished. No distress.  HENT:  Head: Normocephalic and atraumatic.  Mouth/Throat: Oropharynx is clear and moist. No oropharyngeal exudate.  Eyes: Conjunctivae and EOM are normal. Pupils are equal, round, and reactive to light. Right eye exhibits no discharge. Left eye exhibits no discharge. No scleral icterus.  Neck: Normal range of motion. Neck supple. No JVD present. No thyromegaly present.  Cardiovascular: Regular rhythm, normal heart sounds and intact distal pulses.  Exam reveals no gallop and no friction rub.   No murmur heard. Pulse of 105, normal pulses at the radial arteries  Pulmonary/Chest: Effort normal and breath sounds normal. No respiratory distress. He has no wheezes. He has no rales.  Abdominal: Soft. He exhibits no distension and no mass. There is no tenderness.  Bowel sounds increased, soft abdomen  Musculoskeletal: Normal range of motion. He exhibits no edema and no tenderness.  Lymphadenopathy:    He has no cervical adenopathy.  Neurological: He is alert. Coordination normal.  Skin: Skin is warm and dry. There is erythema.  Injection sites in the left antecubital fossa are  erythematous and slightly swollen, no areas of fluctuance  Psychiatric: He has a normal mood and affect. His behavior is normal.    ED Course  Procedures (including critical care time) Labs Review Labs Reviewed  CBC WITH DIFFERENTIAL - Abnormal; Notable for the following:    Hemoglobin 19.0 (*)    MCHC 36.6 (*)    Platelets 148 (*)    All other components within normal limits  COMPREHENSIVE METABOLIC PANEL - Abnormal; Notable for the following:    Sodium 136 (*)    Chloride 94 (*)    AST 38 (*)    All other components within normal  limits  ETHANOL  URINE RAPID DRUG SCREEN (HOSP PERFORMED)   Imaging Review No results found.   EKG Interpretation None      MDM   Final diagnoses:  None    The patient appears to be in withdrawal of opiate medications, he is not in respiratory distress as the initial vital signs would suggest, blood pressure, temperature and oxygen saturations are normal. The patient has had poor appetite over the last 24 hours and appears dehydrated. He has been offered clonidine and IV fluids and recommended outpatient followup for his opiate abuse. He seems to be in agreement with this plan. Labs otherwise unremarkable. He'll need antibiotics for likely early infection over the sites of IV drug use  Change of shift - care signed out to Dr. Criss Alvine  Meds given in ED:  Medications  sodium chloride 0.9 % bolus 1,000 mL (1,000 mLs Intravenous New Bag/Given 01/15/14 0709)  ondansetron (ZOFRAN) injection 4 mg (not administered)  cloNIDine (CATAPRES) tablet 0.1 mg (not administered)  sulfamethoxazole-trimethoprim (BACTRIM DS) 800-160 MG per tablet 1 tablet (not administered)    New Prescriptions   No medications on file      Vida Roller, MD 01/15/14 [phone]

## 2014-01-15 NOTE — ED Notes (Signed)
Pt has ambulated to pod c

## 2014-01-15 NOTE — ED Provider Notes (Addendum)
8:23 AM patient improved with fluids. HR 80s, BP normal. No tachypnea. Patient stating he can't pay for his meds, will consult SW. Will then discharge as he is well appearing and stable.  Audree CamelScott T Ishani Goldwasser, MD 01/15/14 [phone]  patient endorsed suicidal ideation when talking to social work. This is a new finding. He states that he's been passively try to kill himself by starving himself. He also endorses that he heart is date of birth and his future date of death into his kitchen counter. Psych was consult and they feel he needs to be admitted. They will admit him to behavioral health. They will continue antibiotics there.  Audree CamelScott T Florinda Taflinger, MD 01/15/14 1126

## 2014-01-15 NOTE — Progress Notes (Signed)
CM educated patient on MATCH Program.Patient educated the [facility] can be used by the patient once yearly and provides up to $500 dollars worth of Medication. ( Pain medications are not covered) patient aware each medication Incurs a $3 dollar CO-PAY. Patient reports he does not have any funds to buy his Medications.CM will   Continue to follow as provide assist.

## 2014-01-15 NOTE — Progress Notes (Signed)
Patient ID: Cole Morrison, male   DOB: 10/25/1985, 28 y.o.   MRN: [id number] [facility] [address] [facility] polys[facility]stance [facility] and SI.  Patient reported in ED that he had several weeks of SI with multiple recent attempts.  He recently took multiple 8 mg dilaudid and 10 oxycodone in an overdose attempt.  He also reports trying to hang himself on 3/27.  Patient reports decreased sleep, increased irritability and severe depression.  Patient has an ACT team ([facility]) which has been avoiding.  Patient has a prior diagnosis of bipolar disorder which he has not been taking medications for.  Patient has medical hx of arthritis, PTSD, Hepatitis B and C and gastritis.  Patient reports withdrawal symptoms of chills, joint aches and stomach cramps.  Patient has been placed on the clonidine protocol.  Patient denies any SI/HI/AVH at this time.  Patient's skin assessment revealed track marks bilaterally on arms.  He has a abscess on right arm and cellulitis on left arm.  Patient oriented to room and unit.

## 2014-01-15 NOTE — Progress Notes (Signed)
Case Manager met patient at bedside.CM role explained.CM Consult for Medication support.Met Patient and girlfriend.Patient reports Today's ED visit is secondary to his  Request for Detox from drugs.Patient reports he has not been eating or sleeping or taking care of him self.Patient reports he has not seen a PCP in 15 years.CM education Provided to patient on importance of establishing a PCP for his primary health care needs.Resource sheet provided for the [facility] center.Patient was  Educated the clinic had a walk in service Monday/ Thursday 9-10.30am and a patient pharmacy.During this CM interaction patient pulled his arms out from under the bed cover  And stated I did this to my wrists a few days ago.CM noted red superficial cuts.Patient denies suicide intentions today but reports he was feeling suicidal a few days ago.CM  Will update Dr Regenia Skeeter.

## 2014-01-15 NOTE — ED Notes (Addendum)
Called case management about pt request at this time. Pt states that he is unable to afford his medication

## 2014-01-16 DIAGNOSIS — F1120 Opioid dependence, uncomplicated: Secondary | ICD-10-CM | POA: Diagnosis present

## 2014-01-16 DIAGNOSIS — F1110 Opioid abuse, uncomplicated: Secondary | ICD-10-CM

## 2014-01-16 DIAGNOSIS — F1994 Other psychoactive substance use, unspecified with psychoactive substance-induced mood disorder: Secondary | ICD-10-CM

## 2014-01-16 DIAGNOSIS — F3130 Bipolar disorder, current episode depressed, mild or moderate severity, unspecified: Secondary | ICD-10-CM | POA: Diagnosis present

## 2014-01-16 DIAGNOSIS — F1020 Alcohol dependence, uncomplicated: Secondary | ICD-10-CM | POA: Diagnosis present

## 2014-01-16 MED ORDER — HYDROXYZINE HCL 50 MG PO TABS
50.0000 mg | ORAL_TABLET | Freq: Four times a day (QID) | ORAL | Status: DC | PRN
  Administered 2014-01-16: 50 mg via ORAL
  Filled 2014-01-16: qty 1

## 2014-01-16 MED ORDER — PANTOPRAZOLE SODIUM 40 MG IV SOLR
40.0000 mg | Freq: Every day | INTRAVENOUS | Status: DC
  Filled 2014-01-16: qty 40

## 2014-01-16 MED ORDER — ENSURE COMPLETE PO LIQD
237.0000 mL | Freq: Three times a day (TID) | ORAL | Status: DC
  Administered 2014-01-16 – 2014-01-17 (×3): 237 mL via ORAL

## 2014-01-16 MED ORDER — PANTOPRAZOLE SODIUM 40 MG PO TBEC
40.0000 mg | DELAYED_RELEASE_TABLET | Freq: Every day | ORAL | Status: DC
  Administered 2014-01-16 – 2014-01-17 (×2): 40 mg via ORAL
  Filled 2014-01-16 (×4): qty 1

## 2014-01-16 MED ORDER — TRAZODONE HCL 100 MG PO TABS
100.0000 mg | ORAL_TABLET | Freq: Every evening | ORAL | Status: DC | PRN
  Administered 2014-01-16: 100 mg via ORAL
  Filled 2014-01-16 (×4): qty 1
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
## 2014-01-16 NOTE — Progress Notes (Signed)
Recreation Therapy Notes  Animal-Assisted Activity/Therapy (AAA/T) Program Checklist/Progress Notes Patient Eligibility Criteria Checklist & Daily Group note for Rec Tx Intervention  Date: 03.30.2015 Time: 2:45pm Location: [address] Dayroom    AAA/T Program Assumption of Risk Form signed by Patient/ or Parent Legal Guardian yes  Patient is free of allergies or sever asthma yes  Patient reports no fear of animals yes  Patient reports no history of cruelty to animals yes   Patient understands his/her participation is voluntary yes  Behavioral Response: Did not attend.   Marykay Lexenise L Daielle Melcher, LRT/CTRS  Kellianne Ek L 01/16/2014 5:18 PM

## 2014-01-16 NOTE — [facility] Suicide Risk Assessment (Signed)
Suicide Risk Assessment  Admission Assessment     Nursing information obtained from:    Demographic factors:    Current Mental Status:    Loss Factors:    Historical Factors:    Risk Reduction Factors:    Total Time spent with patient: 1 hour  CLINICAL FACTORS:   Bipolar Disorder:   Depressive phase Alcohol/Substance Abuse/Dependencies  COGNITIVE FEATURES THAT CONTRIBUTE TO RISK:  Closed-mindedness Polarized thinking Thought constriction (tunnel vision)    SUICIDE RISK:   Moderate:  Frequent suicidal ideation with limited intensity, and duration, some specificity in terms of plans, no associated intent, good self-control, limited dysphoria/symptomatology, some risk factors present, and identifiable protective factors, including available and accessible social support.  PLAN OF CARE: Supportive approach/copig skills/relapse prevention                               Detox as needed/reassess and address the co morbidities  I certify that inpatient services furnished can reasonably be expected to improve the patient's condition.  Glenetta Kiger A 01/16/2014, 5:20 PM
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
## 2014-01-16 NOTE — [facility] Group Notes (Signed)
[facility] LCSW Group Therapy  01/16/2014 3:16 PM  Type of Therapy:  Group Therapy  Participation Level:  Did Not Attend-pt stated that he is on phone with ACT team regarding housing issues and will come to group after. (he did not come to group)  Smart, Berma Harts LCSWA  01/16/2014, 3:16 PM
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
## 2014-01-16 NOTE — Progress Notes (Signed)
Adult Psychoeducational Group Note  Date:  01/16/2014 Time:  9:00PM Group Topic/Focus:  Wrap-Up Group:   The focus of this group is to help patients review their daily goal of treatment and discuss progress on daily workbooks.  Participation Level:  Did Not Attend  Additional Comments:  Pt. Didn't attend group.   Bing PlumeScott, Dominic Rhome D 01/16/2014, 10:05 PM

## 2014-01-16 NOTE — Progress Notes (Signed)
NUTRITION ASSESSMENT  Pt identified as at risk on the Malnutrition Screen Tool  INTERVENTION: Patient is high risk for refeeding syndrome.  Discussed this with RN who will communicate this with medical staff the risk and need to monitor potassium, magnesium, and phosphorus and replete as needed. 1. Educated patient on the importance of nutrition and encouraged intake of food and beverages.  Discussed bland diet, importance of gradually increasing intake.  "Sobriety Nutrition Therapy" handout provided and discussed. 2. Discussed weight goals. 3. Supplements: Ensure Complete po TID, each supplement provides 350 kcal and 13 grams of protein   NUTRITION DIAGNOSIS: Unintentional weight loss related to sub-optimal intake as evidenced by pt report.   Goal: Pt to meet >/= 90% of their estimated nutrition needs.  Monitor:  PO intake  Assessment:  Patient admitted with SI, SA,  and opiate abuse (heroine, dilaudid, OxyContin). Hx includes arthritis, gastritis, PTSD,and Hepatitis.   Passive suicide by not eating per chart.  Spoke with patient who reports that he threw up breakfast and has only eaten once in the past 8 days.  States usual intake is twice a week due to drug and etoh abuse.  Reports UBW of 160 lbs 1 month ago (this was not confirmed by e-chart).  States that Hepatitis C causing problems with decreased appetite as well.  Tolerating liquids.  Patient meets criteria for severe malnutrition related to social and environmental causes AEB intake <50% for > 1 month and decreased body fat and muscle mass.    27 y.o. male  Height: Ht Readings from Last 1 68ncounters:  01/15/14 5\' 5"  (1.651 m)    Weight: Wt Readings from Last 1 Encounters:  01/15/14 123 lb (55.792 kg)    Weight Hx: Wt Readings from Last 10 Encounters:  01/15/14 123 lb (55.792 kg)  01/15/14 127 lb (57.607 kg)  11/03/13 126 lb (57.153 kg)  09/19/13 131 lb (59.421 kg)  04/05/13 124 lb (56.246 kg)    BMI:  Body mass  index is 20.47 kg/(m^2). Pt meets criteria for normal weight based on current BMI.  Appears very thin.  Estimated Nutritional Needs: Kcal: 25-30 kcal/kg Protein: > 1 gram protein/kg Fluid: 1 ml/kcal  Diet Order: General Pt is also offered choice of unit snacks mid-morning and mid-afternoon.  Pt is eating as desired.   Lab results and medications reviewed.   Oran ReinLaura Jobe, RD, LDN Clinical Inpatient Dietitian Pager:  [phone] Weekend and after hours pager:  [phone]

## 2014-01-16 NOTE — Progress Notes (Signed)
Patient ID: Cole CompWestley Morrison, male   DOB: [birth year], 28 y.o.   MRN: [id number] He has been in bed most of day was up to see MD and to get his medication . He has c/o having chronic stomach pain, see new order, but he refused his medication at 12 noon refused to go to lunch stated that he felt to bad. Asked where he felt bad at he did not give a clear answer except that he felt bad. He had a visitor at meal time and he was up talking to her stated that he felt great and that he wanted to leave. Stated that he just told the ED he was suicidal to get into here.  Self inventory 08:00 depression 5, hopeless 4, withdrawals of tremors, chilling, and diarrhea. Has not requested any prn for withdrawal symptoms. He denies SI thoughts did c.o dizziness, and lightlessness.

## 2014-01-16 NOTE — [facility] Suicide Risk Assessment (Signed)
[facility] INPATIENT:  Family/Significant Other Suicide Prevention Education  Suicide Prevention Education:  Contact Attempts: Lynnae Sandhoffracie Marshall (pt's girlfriend) [phone] has been identified by the patient as the family member/significant other with whom the patient will be residing, and identified as the person(s) who will aid the patient in the event of a mental health crisis.  With written consent from the patient, two attempts were made to provide suicide prevention education, prior to and/or following the patient's discharge.  We were unsuccessful in providing suicide prevention education.  A suicide education pamphlet was given to the patient to share with family/significant other.  Date and time of first attempt: 01/16/14 12:55PM  Date and time of second attempt: 01/16/14 3:33PM (unable to leave msg--no voicemail service)  Smart, Oak GroveHeather LCSWA  01/16/2014, 12:54 PM

## 2014-01-17 DIAGNOSIS — F1020 Alcohol dependence, uncomplicated: Secondary | ICD-10-CM

## 2014-01-17 DIAGNOSIS — F319 Bipolar disorder, unspecified: Secondary | ICD-10-CM

## 2014-01-17 DIAGNOSIS — F1120 Opioid dependence, uncomplicated: Principal | ICD-10-CM

## 2014-01-17 LAB — COMPREHENSIVE METABOLIC PANEL
ALK PHOS: 96 U/L (ref 39–117)
ALT: 14 U/L (ref 0–53)
AST: 19 U/L (ref 0–37)
Albumin: 3.8 g/dL (ref 3.5–5.2)
BUN: 13 mg/dL (ref 6–23)
CO2: 27 meq/L (ref 19–32)
CREATININE: 0.78 mg/dL (ref 0.50–1.35)
Calcium: 10 mg/dL (ref 8.4–10.5)
Chloride: 101 mEq/L (ref 96–112)
GFR calc Af Amer: >90 mL/min (ref >90)
Glucose, Bld: 107 mg/dL — ABNORMAL HIGH (ref 70–99)
POTASSIUM: 4.3 meq/L (ref 3.7–5.3)
Sodium: 139 mEq/L (ref 137–147)
Total Bilirubin: 0.3 mg/dL (ref 0.3–1.2)
Total Protein: 7.3 g/dL (ref 6.0–8.3)

## 2014-01-17 LAB — MAGNESIUM: Magnesium: 2.2 mg/dL (ref 1.5–2.5)

## 2014-01-17 MED ORDER — HYDROXYZINE HCL 25 MG PO TABS
25.0000 mg | ORAL_TABLET | Freq: Three times a day (TID) | ORAL | Status: DC | PRN
  Filled 2014-01-17: qty 30

## 2014-01-17 MED ORDER — TRAZODONE HCL 100 MG PO TABS: 100.0000 mg | ORAL_TABLET | Freq: Every evening | ORAL | Status: DC | PRN

## 2014-01-17 MED ORDER — HYDROXYZINE HCL 50 MG PO TABS: ORAL_TABLET | ORAL | Status: DC

## 2014-01-17 NOTE — [facility] Suicide Risk Assessment (Signed)
Suicide Risk Assessment  Discharge Assessment     Demographic Factors:  Male and Caucasian  Total Time spent with patient: 45 minutes  Psychiatric Specialty Exam:     Blood pressure 106/72, pulse 85, temperature 98 F (36.7 C), temperature source Oral, resp. rate 20, height 5\' 5"  (1.651 m), weight 55.792 kg (123 lb).Body mass index is 20.47 kg/(m^2).  General Appearance: Fairly Groomed  Patent attorneyye Contact::  Fair  Speech:  Clear and Coherent  Volume:  Normal  Mood:  Euthymic  Affect:  Appropriate  Thought Process:  Coherent and Goal Directed  Orientation:  Full (Time, Place, and Person)  Thought Content:  His relapse prevention plan, states he is planning to get a new place with his girlfriend  Suicidal Thoughts:  No  Homicidal Thoughts:  No  Memory:  Immediate;   Fair Recent;   Fair Remote;   Fair  Judgement:  Fair  Insight:  Present  Psychomotor Activity:  Normal  Concentration:  Fair  Recall:  FiservFair  Fund of Knowledge:NA  Language: Fair  Akathisia:  No  Handed:  Right  AIMS (if indicated):     Assets:  Desire for Improvement Housing Social Support  Sleep:  Number of Hours: 6.75    Musculoskeletal: Strength & Muscle Tone: within normal limits Gait & Station: normal Patient leans: N/A   Mental Status Per Nursing Assessment::   On Admission:     Current Mental Status by Physician: IN full contact with reality. There are no active S/S of withdrawal. He is going to follow up [facility]   Loss Factors: NA  Historical Factors: NA  Risk Reduction Factors:   Sense of responsibility to family, Living with another person, especially a relative, Positive social support and Positive therapeutic relationship  Continued Clinical Symptoms:  Alcohol/Substance Abuse/Dependencies  Cognitive Features That Contribute To Risk:  Closed-mindedness Polarized thinking Thought constriction (tunnel vision)    Suicide Risk:  Minimal: No identifiable suicidal ideation.  Patients  presenting with no risk factors but with morbid ruminations; may be classified as minimal risk based on the severity of the depressive symptoms  Discharge Diagnoses:   AXIS I:  Opioid Dependence, Alcohol Abuse, ADHD, Mood Disorder NOS AXIS II:  Deferred AXIS III:   Past Medical History  Diagnosis Date  . Arthritis   . Diabetes mellitus without complication Borderline  . Overactive bladder   . PTSD (post-traumatic stress disorder)   . Bipolar 1 disorder   . Acid reflux   . 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 Gastritis    AXIS IV:  other psychosocial or environmental problems AXIS V:  61-70 mild symptoms  Plan Of Care/Follow-up recommendations:  Activity:  as tolerated Diet:  regular Follow up [facility] Is patient on multiple antipsychotic therapies at discharge:  No   Has Patient had three or more failed trials of antipsychotic monotherapy by history:  No  Recommended Plan for Multiple Antipsychotic Therapies: NA    Milliani Herrada A 01/17/2014, 12:34 PM

## 2014-01-17 NOTE — Tx Team (Signed)
Interdisciplinary Treatment Plan Update (Adult)  Date: 01/17/2014   Time Reviewed: 10:49 AM  Progress in Treatment:  Attending groups: Intermittently  Participating in groups:  Minimally  Taking medication as prescribed: Yes  Tolerating medication: Yes  Family/Significant othe contact made: Attempts made with pt's gf. SPE completed with pt.   Patient understands diagnosis: Yes, AEB seeking treatment for heroin detox, polysubstance abuse, Si, medication stabilization, and mood stabilization.  Discussing patient identified problems/goals with staff: Yes  Medical problems stabilized or resolved: Yes  Denies suicidal/homicidal ideation: Yes during group/self report.  Patient has not harmed self or Others: Yes  New problem(s) identified:  Discharge Plan or Barriers: Pt plans to followup with his ACT team ([facility]). Pt has been in contact with Victorino DikeJennifer (head of ACT team and will contact her at d/c.) Pt not being truthful with [facility] and [facility] regarding d/c plan. (he is telling different staff members different plans for d/c and did not attend this morning's d/c planning group.)Per Dr. Dub MikesLugo, pt to d/c today per his request.  Additional comments: [name] who states he quit using heroin 3 days ago after a long binge. Has been using for 8 years. Started taking pills and a friend told him to use them IV. Then somebody introduced him into using heroin. States he was drinking pretty heavily a fith a day for a month or two. Was using heroin every day as he would get it by cleaning this guys house. Diagnosed Bipolar with psychosis. Admits to drastic mood swings from depressed to happy to angry. Usually not triggered. . States he experienced voices that told him to hurt himself Has not experienced voices in a while. 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 Describes several traumatic events in his life like friends committing suicide, being incarcerated. He was given  an ultimatum by his girlfriend and her 28 Y/O son who told him if he was not to quit they were not going to be around him  Reason for Continuation of Hospitalization: d/c today  Estimated length of stay: d/c today  For review of initial/current patient goals, please see plan of care.  Attendees:  Patient:    Family:    Physician: Geoffery LyonsIrving Lugo MD 01/17/2014 10:49 AM   Nursing: Brayton ElBritney RN  01/17/2014 10:49 AM   Clinical Social Worker Dougles Kimmey Smart, LCSWA  01/17/2014 10:49 AM   Other: Maury Dusonna Rn  01/17/2014 10:49 AM   Other: Maureen RalphsVivian RN 01/17/2014 10:49 AM   Other: Darden DatesJennifer C. Nurse CM  01/17/2014. 10:49 AM   Other: Chandra BatchAggie N. PA 01/17/2014 10:49 AM   Scribe for Treatment Team:  The Sherwin-WilliamsHeather Smart LCSWA 01/17/2014 10:49 AM

## 2014-01-17 NOTE — Progress Notes (Signed)
[facility] Discharge Plan :  Will you be returning to the same living situation after discharge: Yes,  home with gf At discharge, do you have transportation home?:Yes,  bus/girlfriend Do you have the ability to pay for your medications:Yes,  mental health  Release of information consent forms completed and submitted to Medical Records by CSW.   Patient to Follow up at: Follow-up Information   Follow up with [facility] On 01/17/2014. (Call HoughtonJennifer (ACT team) at discharge to arrange next visit. )    Contact information:   [address] [place], [place] [address] Phone: [phone] Fax: [phone]      Patient denies SI/HI:   Yes,  during group/self report.     Safety Planning and Suicide Prevention discussed:  Yes,  Contact attempts made with pt's girlfriend. SPE completed with pt and she was encouraged to share information with support network, ask questions, and talk about any concerns relating to SPE.  Smart, Delonda Coley LCSWA  01/17/2014, 10:54 AM

## 2014-01-17 NOTE — [facility] Group Notes (Signed)
[facility] Note   01/17/2014 9:29 AM  Participation Quality:  DID NOT ATTEND-pt in room sleeping/did not come to group.   Smart, American FinancialHeather LCSWA

## 2014-01-17 NOTE — Discharge Summary (Signed)
Physician Discharge Summary Note  Patient:  Cole Morrison is an 28 y.o., male MRN:  [id number] DOB:  05-10-1986 Patient phone:  [phone] (home)  Patient address:   [address] [place] [address],  Total Time spent with patient: Greater than 30 minutes  Date of Admission:  01/15/2014 Date of Discharge: 01/17/14  Reason for Admission: Opioid detox  Discharge Diagnoses: Active Problems:   Substance induced mood disorder   Opioid dependence   Alcohol abuse   Bipolar disorder   Psychiatric Specialty Exam: Physical Exam  Psychiatric: His speech is normal and behavior is normal. Judgment and thought content normal. His mood appears not anxious. His affect is not angry, not blunt, not labile and not inappropriate. Cognition and memory are normal. He does not exhibit a depressed mood.    Review of Systems  Constitutional: Negative.   HENT: Negative.   Eyes: Negative.   Respiratory: Negative.   Cardiovascular: Negative.   Gastrointestinal: Negative.   Genitourinary: Negative.   Musculoskeletal: Negative.   Skin: Negative.   Neurological: Negative.   Endo/Heme/Allergies: Negative.   Psychiatric/Behavioral: Positive for substance abuse (Opioid dependence, Alcoholism). Negative for depression, suicidal ideas, hallucinations and memory loss. The patient has insomnia (Stable). The patient is not nervous/anxious.     Blood pressure 106/72, pulse 85, temperature 98 F (36.7 C), temperature source Oral, resp. rate 20, height $RemoveBe'5\' 5"'fkSrwHoFL$  (1.651 m), weight 55.792 kg (123 lb).Body mass index is 20.47 kg/(m^2).   General Appearance: Fairly Groomed  Engineer, water:: Fair  Speech: Clear and Coherent  Volume: Normal  Mood: Euthymic  Affect: Appropriate  Thought Process: Coherent and Goal Directed  Orientation: Full (Time, Place, and Person)  Thought Content: His relapse prevention plan, states he is planning to get a new place with his girlfriend  Suicidal Thoughts: No  Homicidal  Thoughts: No Memory: Immediate; Fair  Recent; Fair  Remote; Fair  Judgement: Fair  Insight: Present  Psychomotor Activity: Normal  Concentration: Fair  Recall: [employer] of Knowledge:NA  Language: Fair  Akathisia: No  Handed: Right  AIMS (if indicated): Assets: Desire for Improvement  Housing  Social Support  Sleep: Number of Hours: 6.75  Past Psychiatric History: Diagnosis: Alcohol Related Disorder - Moderate (303.90) and Opioid Disorder - Severe (304.00), Substance induced mood disorder, Bipolar disorder  Hospitalizations: [facility]  Outpatient Care: [facility] Team  Substance Abuse Care: [facility] Team  Self-Mutilation: NA  Suicidal Attempts: NA  Violent Behaviors: Denies   Musculoskeletal: Strength & Muscle Tone: within normal limits Gait & Station: normal Patient leans: N/A  DSM5: Schizophrenia Disorders:  NA Obsessive-Compulsive Disorders:  NA Trauma-Stressor Disorders:  NA Substance/Addictive Disorders:  Alcohol Related Disorder - Moderate (303.90) and Opioid Disorder - Severe (304.00) Depressive Disorders:  Substance induced mood disorder, Bipolar affective disorder  Axis Diagnosis:   AXIS I:  Alcohol Related Disorder - Moderate (303.90) and Opioid Disorder - Severe (304.00), Substance induced mood disorder, Bipolar disorder AXIS II:  Deferred AXIS III:   Past Medical History  Diagnosis Date  . Arthritis   . Diabetes mellitus without complication Borderline  . Overactive bladder   . PTSD (post-traumatic stress disorder)   . Bipolar 1 disorder   . Acid reflux   . 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 Gastritis    AXIS IV:  other psychosocial or environmental problems and Polysubstance dependence AXIS V:  61  Level of Care:  OP  Hospital Course:  28 Y/O  male who states he quit using heroin 3 days ago after a long binge. Has been using for 8 years. Started taking pills and a friend told him to use them IV. Then somebody introduced him into using  heroin. States he was drinking pretty heavily a fith a day for a month or two. Was using heroin every day as he would get it by cleaning this guys house. Diagnosed Bipolar with psychosis. Admits to drastic mood swings from depressed to happy to angry. Usually not triggered. . States he experienced voices that told him to hurt himself Has not experienced voices in a while. 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 Describes several traumatic events in his life like friends committing suicide, being incarcerated. He was given an ultimatum by his girlfriend and her 28 Y/O son who told him if he was not to quit they were not going to be around him.  After admission assessment/evaluation, Cole Morrison was started on Clonidine detox protocols for his opiate detox. He was also started on Trazodone 100 mg for sleep and Hydroxyzine 25 mg three times daily for anxiety. Cole Morrison was also enrolled in group counseling sessions and AA/NA meetings to learn coping skills.  Cole Morrison came to the providers this am and asked to be discharged to follow-up care with his ACT team. He states that he is not having any acute withdrawal symptoms of opiates and or any other substances. He denies SIHI, AVH, delusions, paranoia and or withdrawal symptoms. His ACT Team psychiatrist stopped by to the unit and [facility] care management was discussed as well. He received 14 days worth supply samples of his [facility] discharge medications. He left [facility] with all personal belongings in no distress. Transportation per girlfriend.  Consults:  psychiatry  Significant Diagnostic Studies:  labs: CBC with diff, CMP, UDS, toxicology tests, U/A  Discharge Vitals:   Blood pressure 106/72, pulse 85, temperature 98 F (36.7 C), temperature source Oral, resp. rate 20, height $RemoveBe'5\' 5"'[id number]$  (1.651 m), weight 55.792 kg (123 lb). Body mass index is 20.47 kg/(m^2). Lab Results:   Results for orders placed during the hospital  encounter of 01/15/14 (from the past 72 hour(s))  COMPREHENSIVE METABOLIC PANEL     Status: Abnormal   Collection Time    01/17/14  6:10 AM      Result Value Ref Range   Sodium 139  137 - 147 mEq/L   Potassium 4.3  3.7 - 5.3 mEq/L   Chloride 101  96 - 112 mEq/L   CO2 27  19 - 32 mEq/L   Glucose, Bld 107 (*) 70 - 99 mg/dL   BUN 13  6 - 23 mg/dL   Creatinine, Ser 0.78  0.50 - 1.35 mg/dL   Calcium 10.0  8.4 - 10.5 mg/dL   Total Protein 7.3  6.0 - 8.3 g/dL   Albumin 3.8  3.5 - 5.2 g/dL   AST 19  0 - 37 U/L   ALT 14  0 - 53 U/L   Alkaline Phosphatase 96  39 - 117 U/L   Total Bilirubin 0.3  0.3 - 1.2 mg/dL   GFR calc non Af Amer >90  >90 mL/min   GFR calc Af Amer >90  >90 mL/min   Comment: (NOTE)     The eGFR has been calculated using the CKD EPI equation.     This calculation has not been validated in all clinical situations.     eGFR's  persistently <90 mL/min signify possible Chronic Kidney     Disease.     Performed at [facility]  [facility]     Status: None   Collection Time    01/17/14  6:10 AM      Result Value Ref Range   Magnesium 2.2  1.5 - 2.5 mg/dL   Comment: Performed at [facility]    Physical Findings: AIMS: Facial and Oral Movements Muscles of Facial Expression: None, normal Lips and Perioral Area: None, normal Jaw: None, normal Tongue: None, normal,Extremity Movements Upper (arms, wrists, hands, fingers): None, normal Lower (legs, knees, ankles, toes): None, normal, Trunk Movements Neck, shoulders, hips: None, normal, Overall Severity Severity of abnormal movements (highest score from questions above): None, normal Incapacitation due to abnormal movements: None, normal Patient's awareness of abnormal movements (rate only patient's report): No Awareness, Dental Status Current problems with teeth and/or dentures?: No Does patient usually wear dentures?: No  CIWA:  CIWA-Ar Total: 0 COWS:  COWS Total Score: 6  Psychiatric  Specialty Exam: See Psychiatric Specialty Exam and Suicide Risk Assessment completed by Attending Physician prior to discharge.  Discharge destination:  Home  Is patient on multiple antipsychotic therapies at discharge:  No   Has Patient had three or more failed trials of antipsychotic monotherapy by history:  No  Recommended Plan for Multiple Antipsychotic Therapies: NA     Medication List       Indication   hydrOXYzine 50 MG tablet  Commonly known as:  ATARAX/VISTARIL  Take 1 tablet (25 mg) three times as needed daily: For anxiety   Indication:  Anxiety associated with Organic Disease, Tension     traZODone 100 MG tablet  Commonly known as:  DESYREL  Take 1 tablet (100 mg total) by mouth at bedtime and may repeat dose one time if needed. For sleep   Indication:  Trouble Sleeping       Follow-up Information   Follow up with [facility] On 01/17/2014. (Call [facility] (ACT team) at discharge to arrange next visit. )    Contact information:   [address] [place], [place] [address] Phone: [phone] Fax: [phone]     Follow-up recommendations:  Activity:  As tolerated Diet: As recommended by your primary care doctor. Keep all scheduled follow-up appointments as recommended.  Comments:  Take all your medications as prescribed by your mental healthcare provider. Report any adverse effects and or reactions from your medicines to your outpatient provider promptly. Patient is instructed and cautioned to not engage in alcohol and or illegal drug use while on prescription medicines. In the event of worsening symptoms, patient is instructed to call the crisis hotline, 911 and or go to the nearest ED for appropriate evaluation and treatment of symptoms. Follow-up with your primary care provider for your other medical issues, concerns and or health care needs.   Total Discharge Time:  Greater than 30 minutes.  Signed: Encarnacion Slates, PMHNP-BC 01/17/2014, 11:04  AM Personally evaluated the patient and agree with the assessment and plan Geralyn Flash A. Sabra Heck, M.D.

## 2014-01-17 NOTE — Progress Notes (Signed)
Pt was discharged home today.  He denied any S/I H/I or A/V hallucinations.    He was given f/u appointment, rx, sample medications, and hotline info booklet.  He voiced understanding to all instructions provided.  He declined the need for smoking cessation materials.  

## 2014-01-18 NOTE — Progress Notes (Signed)
Patient Discharge Instructions:  After Visit Summary (AVS):   Faxed to:  01/18/14 Discharge Summary Note:   Faxed to:  01/18/14 Psychiatric Admission Assessment Note:   Faxed to:  01/18/14 Suicide Risk Assessment - Discharge Assessment:   Faxed to:  01/18/14 Faxed/Sent to the Next Level Care provider:  01/18/14 Faxed to [facility] @ [phone]  Jerelene ReddenSheena E Hondo, 01/18/2014, 3:53 PM

## 2014-03-16 ENCOUNTER — Emergency Department (HOSPITAL_COMMUNITY)
Admission: EM | Admit: 2014-03-16 | Discharge: 2014-03-16 | Disposition: A | Discharge status: Home / Self Care | Payer: Self-pay | Attending: Emergency Medicine | Admitting: Emergency Medicine

## 2014-03-16 ENCOUNTER — Encounter (HOSPITAL_COMMUNITY): Payer: Self-pay | Admitting: Emergency Medicine

## 2014-03-16 DIAGNOSIS — Z87448 Personal history of other diseases of urinary system: Secondary | ICD-10-CM | POA: Insufficient documentation

## 2014-03-16 DIAGNOSIS — Y9389 Activity, other specified: Secondary | ICD-10-CM | POA: Insufficient documentation

## 2014-03-16 DIAGNOSIS — R42 Dizziness and giddiness: Secondary | ICD-10-CM | POA: Insufficient documentation

## 2014-03-16 DIAGNOSIS — H109 Unspecified conjunctivitis: Secondary | ICD-10-CM | POA: Insufficient documentation

## 2014-03-16 DIAGNOSIS — Z8659 Personal history of other mental and behavioral disorders: Secondary | ICD-10-CM | POA: Insufficient documentation

## 2014-03-16 DIAGNOSIS — W278XXA Contact with other nonpowered hand tool, initial encounter: Secondary | ICD-10-CM | POA: Insufficient documentation

## 2014-03-16 DIAGNOSIS — F17200 Nicotine dependence, unspecified, uncomplicated: Secondary | ICD-10-CM | POA: Insufficient documentation

## 2014-03-16 DIAGNOSIS — E119 Type 2 diabetes mellitus without complications: Secondary | ICD-10-CM | POA: Insufficient documentation

## 2014-03-16 DIAGNOSIS — Z79899 Other long term (current) drug therapy: Secondary | ICD-10-CM | POA: Insufficient documentation

## 2014-03-16 DIAGNOSIS — Z23 Encounter for immunization: Secondary | ICD-10-CM | POA: Insufficient documentation

## 2014-03-16 DIAGNOSIS — Z8719 Personal history of other diseases of the digestive system: Secondary | ICD-10-CM | POA: Insufficient documentation

## 2014-03-16 DIAGNOSIS — Z8739 Personal history of other diseases of the musculoskeletal system and connective tissue: Secondary | ICD-10-CM | POA: Insufficient documentation

## 2014-03-16 DIAGNOSIS — Y929 Unspecified place or not applicable: Secondary | ICD-10-CM | POA: Insufficient documentation

## 2014-03-16 MED ORDER — TETRACAINE HCL 0.5 % OP SOLN
2.0000 [drp] | Freq: Once | OPHTHALMIC | Status: AC
  Administered 2014-03-16: 2 [drp] via OPHTHALMIC
  Filled 2014-03-16: qty 2

## 2014-03-16 MED ORDER — POLYMYXIN B-TRIMETHOPRIM 10000-0.1 UNIT/ML-% OP SOLN: 1.0000 [drp] | OPHTHALMIC | Status: DC

## 2014-03-16 MED ORDER — TETANUS-DIPHTH-ACELL PERTUSSIS 5-2.5-18.5 LF-MCG/0.5 IM SUSP
0.5000 mL | Freq: Once | INTRAMUSCULAR | Status: AC
  Administered 2014-03-16: 0.5 mL via INTRAMUSCULAR
  Filled 2014-03-16: qty 0.5

## 2014-03-16 MED ORDER — FLUORESCEIN SODIUM 1 MG OP STRP
1.0000 | ORAL_STRIP | Freq: Once | OPHTHALMIC | Status: AC
  Administered 2014-03-16: 1 via OPHTHALMIC
  Filled 2014-03-16: qty 1

## 2014-03-16 NOTE — ED Provider Notes (Signed)
CSN: [id number]     Arrival date & time 03/16/14  1751 History  This chart was scribed for non-physician practitioner Felicie Morn, NP working with Glynn Octave, MD by Valera Castle, ED scribe. This patient was seen in room TR11C/TR11C and the patient's care was started at 8:26 PM.   Chief Complaint  Patient presents with  . Eye Pain   (Consider location/radiation/quality/duration/timing/severity/associated sxs/prior Treatment) Patient is a 28 y.o. male presenting with eye pain. The history is provided by the patient. No language interpreter was used.  Eye Pain This is a new problem. The current episode started yesterday. The problem occurs constantly. The problem has not changed since onset.Associated symptoms comments: Positive for right eye redness, blurred vision, and pus like discharge. . Exacerbated by: movement of his eye. Nothing relieves the symptoms.   HPI Comments: Cole Morrison is a 28 y.o. male who presents to the Emergency Department complaining of constant, right eye pain with redness, onset last night after accidentally hitting his eye with a steel piece of equipment. He reports there has been some bleeding and pus like discharge from his right eye. He reports his eye feels scratchy and is blurry. He also reports associated dizziness after impact, but denies LOC. He denies any open wounds, and any other associated symptoms.   PCP - No PCP Per Patient  Past Medical History  Diagnosis Date  . Arthritis   . Diabetes mellitus without complication Borderline  . Overactive bladder   . PTSD (post-traumatic stress disorder)   . Bipolar 1 disorder   . Acid reflux   . 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 Gastritis    History reviewed. No pertinent past surgical history. History reviewed. No pertinent family history. History  Substance Use Topics  . Smoking status: Current Every Day Smoker -- 0.50 packs/day    Types: Cigarettes  . Smokeless tobacco: Never Used  . Alcohol  Use: No    Review of Systems  Eyes: Positive for pain (right), discharge (pus), redness and visual disturbance (blurred).  Skin: Negative for wound.  Neurological: Positive for dizziness. Negative for syncope.  All other systems reviewed and are negative.  Allergies  Ibuprofen and Nsaids  Home Medications   Prior to Admission medications   Medication Sig Start Date End Date Taking? Authorizing Provider  naphazoline-pheniramine (NAPHCON-A) 0.025-0.3 % ophthalmic solution Place 1 drop into the right eye daily as needed for irritation.   Yes Historical Provider, MD   BP 105/68  Pulse 104  Temp(Src) 98.2 F (36.8 C) (Oral)  Resp 20  Ht 5\' 7"  (1.702 m)  Wt 130 lb (58.968 kg)  BMI 20.36 kg/m2  SpO2 97% Physical Exam  Nursing note and vitals reviewed. Constitutional: He is oriented to person, place, and time. He appears well-developed and well-nourished. No distress.  HENT:  Head: Normocephalic and atraumatic.  Eyes: EOM are normal. Right conjunctiva is injected.  Neck: Neck supple. No tracheal deviation present.  Cardiovascular: Normal rate.   Pulmonary/Chest: Effort normal. No respiratory distress.  Musculoskeletal: Normal range of motion.  Neurological: He is alert and oriented to person, place, and time.  Skin: Skin is warm and dry.  Psychiatric: He has a normal mood and affect. His behavior is normal.    ED Course  Procedures (including critical care time)  DIAGNOSTIC STUDIES: Oxygen Saturation is 97% on room air, normal by my interpretation.    COORDINATION OF CARE: 8:30 PM-Discussed treatment plan which includes an eye exam with  pt at bedside and pt agreed to plan.   9:13 PM - Eye exam performed.   Labs Review Labs Reviewed - No data to display  Imaging Review No results found.   EKG Interpretation None     Medications  Tdap (BOOSTRIX) injection 0.5 mL (0.5 mLs Intramuscular Given 03/16/14 1816)   MDM   Final diagnoses:  None    Conjunctivitis  right eye s/p being struck by hammer two days ago.  No corneal abrasion or foreign body noted.   I personally performed the services described in this documentation, which was scribed in my presence. The recorded information has been reviewed and is accurate.      Jimmye Normanavid John Kaylea Mounsey, NP 03/17/14 [id number]

## 2014-03-16 NOTE — Discharge Instructions (Signed)
Bacterial Conjunctivitis  Bacterial conjunctivitis, commonly called pink eye, is an inflammation of the clear membrane that covers the white part of the eye (conjunctiva). The inflammation can also happen on the underside of the eyelids. The blood vessels in the conjunctiva become inflamed causing the eye to become red or pink. Bacterial conjunctivitis may spread easily from one eye to another and from person to person (contagious).   CAUSES   Bacterial conjunctivitis is caused by bacteria. The bacteria may come from your own skin, your upper respiratory tract, or from someone else with bacterial conjunctivitis.  SYMPTOMS   The normally white color of the eye or the underside of the eyelid is usually pink or red. The pink eye is usually associated with irritation, tearing, and some sensitivity to light. Bacterial conjunctivitis is often associated with a thick, yellowish discharge from the eye. The discharge may turn into a crust on the eyelids overnight, which causes your eyelids to stick together. If a discharge is present, there may also be some blurred vision in the affected eye.  DIAGNOSIS   Bacterial conjunctivitis is diagnosed by your caregiver through an eye exam and the symptoms that you report. Your caregiver looks for changes in the surface tissues of your eyes, which may point to the specific type of conjunctivitis. A sample of any discharge may be collected on a cotton-tip swab if you have a severe case of conjunctivitis, if your cornea is affected, or if you keep getting repeat infections that do not respond to treatment. The sample will be sent to a lab to see if the inflammation is caused by a bacterial infection and to see if the infection will respond to antibiotic medicines.  TREATMENT   · Bacterial conjunctivitis is treated with antibiotics. Antibiotic eyedrops are most often used. However, antibiotic ointments are also available. Antibiotics pills are sometimes used. Artificial tears or eye  washes may ease discomfort.  HOME CARE INSTRUCTIONS   · To ease discomfort, apply a cool, clean wash cloth to your eye for 10 20 minutes, 3 4 times a day.  · Gently wipe away any drainage from your eye with a warm, wet washcloth or a cotton ball.  · Wash your hands often with soap and water. Use paper towels to dry your hands.  · Do not share towels or wash cloths. This may spread the infection.  · Change or wash your pillow case every day.  · You should not use eye makeup until the infection is gone.  · Do not operate machinery or drive if your vision is blurred.  · Stop using contacts lenses. Ask your caregiver how to sterilize or replace your contacts before using them again. This depends on the type of contact lenses that you use.  · When applying medicine to the infected eye, do not touch the edge of your eyelid with the eyedrop bottle or ointment tube.  SEEK IMMEDIATE MEDICAL CARE IF:   · Your infection has not improved within 3 days after beginning treatment.  · You had yellow discharge from your eye and it returns.  · You have increased eye pain.  · Your eye redness is spreading.  · Your vision becomes blurred.  · You have a fever or persistent symptoms for more than 2 3 days.  · You have a fever and your symptoms suddenly get worse.  · You have facial pain, redness, or swelling.  MAKE SURE YOU:   · Understand these instructions.  · Will watch your   condition.  · Will get help right away if you are not doing well or get worse.  Document Released: 10/05/2005 Document Revised: 06/29/2012 Document Reviewed: 03/07/2012  ExitCare® Patient Information ©2014 [employer].

## 2014-03-16 NOTE — ED Notes (Signed)
Presents with right eye pain and blurred vision pain described as burning after smacking self in eye with a hammer 2 days ago. Right eye is red.

## 2014-03-16 NOTE — ED Notes (Signed)
Pharmacy notified of Tdap needing verification by pharmacist.

## 2014-03-17 NOTE — ED Provider Notes (Signed)
Medical screening examination/treatment/procedure(s) were performed by non-physician practitioner and as supervising physician I was immediately available for consultation/collaboration.   EKG Interpretation None        Kennetta Pavlovic, MD 03/17/14 0316 

## 2014-03-27 ENCOUNTER — Emergency Department (HOSPITAL_COMMUNITY)
Admission: EM | Admit: 2014-03-27 | Discharge: 2014-03-27 | Disposition: A | Discharge status: Home / Self Care | Payer: Self-pay | Attending: Emergency Medicine | Admitting: Emergency Medicine

## 2014-03-27 ENCOUNTER — Encounter (HOSPITAL_COMMUNITY): Payer: Self-pay | Admitting: Emergency Medicine

## 2014-03-27 ENCOUNTER — Emergency Department (HOSPITAL_COMMUNITY): Payer: Self-pay

## 2014-03-27 DIAGNOSIS — Z8619 Personal history of other infectious and parasitic diseases: Secondary | ICD-10-CM | POA: Insufficient documentation

## 2014-03-27 DIAGNOSIS — Z8719 Personal history of other diseases of the digestive system: Secondary | ICD-10-CM | POA: Insufficient documentation

## 2014-03-27 DIAGNOSIS — F1120 Opioid dependence, uncomplicated: Secondary | ICD-10-CM

## 2014-03-27 DIAGNOSIS — Z8659 Personal history of other mental and behavioral disorders: Secondary | ICD-10-CM | POA: Insufficient documentation

## 2014-03-27 DIAGNOSIS — F1910 Other psychoactive substance abuse, uncomplicated: Secondary | ICD-10-CM

## 2014-03-27 DIAGNOSIS — E119 Type 2 diabetes mellitus without complications: Secondary | ICD-10-CM | POA: Insufficient documentation

## 2014-03-27 DIAGNOSIS — F17200 Nicotine dependence, unspecified, uncomplicated: Secondary | ICD-10-CM | POA: Insufficient documentation

## 2014-03-27 LAB — URINALYSIS, ROUTINE W REFLEX MICROSCOPIC
BILIRUBIN URINE: NEGATIVE
Glucose, UA: NEGATIVE mg/dL
Hgb urine dipstick: NEGATIVE
Ketones, ur: NEGATIVE mg/dL
Leukocytes, UA: NEGATIVE
Nitrite: NEGATIVE
Protein, ur: NEGATIVE mg/dL
SPECIFIC GRAVITY, URINE: 1.005 (ref 1.005–1.030)
UROBILINOGEN UA: 1 mg/dL (ref 0.0–1.0)
pH: 6 (ref 5.0–8.0)

## 2014-03-27 LAB — BASIC METABOLIC PANEL
BUN: 12 mg/dL (ref 6–23)
CO2: 26 mEq/L (ref 19–32)
Calcium: 9.3 mg/dL (ref 8.4–10.5)
Chloride: 97 mEq/L (ref 96–112)
Creatinine, Ser: 0.88 mg/dL (ref 0.50–1.35)
GFR calc Af Amer: >90 mL/min (ref >90)
Glucose, Bld: 108 mg/dL — ABNORMAL HIGH (ref 70–99)
POTASSIUM: 3.4 meq/L — AB (ref 3.7–5.3)
Sodium: 137 mEq/L (ref 137–147)

## 2014-03-27 LAB — CBC
HEMATOCRIT: 40.7 % (ref 39.0–52.0)
HEMOGLOBIN: 14 g/dL (ref 13.0–17.0)
MCH: 31.3 pg (ref 26.0–34.0)
MCHC: 34.4 g/dL (ref 30.0–36.0)
MCV: 91.1 fL (ref 78.0–100.0)
Platelets: 134 10*3/uL — ABNORMAL LOW (ref 150–400)
RBC: 4.47 MIL/uL (ref 4.22–5.81)
RDW: 11.9 % (ref 11.5–15.5)
WBC: 14.4 10*3/uL — ABNORMAL HIGH (ref 4.0–10.5)

## 2014-03-27 LAB — CBG MONITORING, ED: GLUCOSE-CAPILLARY: 118 mg/dL — AB (ref 70–99)

## 2014-03-27 MED ORDER — SODIUM CHLORIDE 0.9 % IV BOLUS (SEPSIS)
1000.0000 mL | Freq: Once | INTRAVENOUS | Status: AC
  Administered 2014-03-27: 1000 mL via INTRAVENOUS

## 2014-03-27 MED ORDER — ACETAMINOPHEN 325 MG PO TABS
650.0000 mg | ORAL_TABLET | Freq: Once | ORAL | Status: AC
  Administered 2014-03-27: 650 mg via ORAL

## 2014-03-27 NOTE — ED Notes (Signed)
Poison control called and their recommendations were to consider thrombocytopenia and that the pt was safe to discharge when the MD was comfortable. Dr. Jodi Mourning made aware.

## 2014-03-27 NOTE — ED Notes (Signed)
Per EMS report: pt had 30mg  of oxycodone crushed up, mixed in water, and injected into himself. Pt a/o x4 but very drowsy. Pt c/o headache, dizziness, nausea. Pt denies SI.

## 2014-03-27 NOTE — ED Notes (Signed)
Bed: WA24 Expected date:  Expected time:  Means of arrival:  Comments: EMS 

## 2014-03-27 NOTE — Discharge Instructions (Signed)
If you were given medicines take as directed.  If you are on coumadin or contraceptives realize their levels and effectiveness is altered by many different medicines.  If you have any reaction (rash, tongues swelling, other) to the medicines stop taking and see a physician.   Please follow up as directed and return to the ER or see a physician for new or worsening symptoms.  Thank you. Filed Vitals:   03/27/14 0145 03/27/14 0200 03/27/14 0245 03/27/14 0321  BP:  100/45  89/56  Pulse: 84 76 83   Temp:      TempSrc:      Resp: 16 22 21    SpO2: 97% 95% 96%     Emergency Department Resource Guide 1) Find a Doctor and Pay Out of Pocket Although you won't have to find out who is covered by your insurance plan, it is a good idea to ask around and get recommendations. You will then need to call the office and see if the doctor you have chosen will accept you as a new patient and what types of options they offer for patients who are self-pay. Some doctors offer discounts or will set up payment plans for their patients who do not have insurance, but you will need to ask so you aren't surprised when you get to your appointment.  2) Contact Your Local Health Department Not all health departments have doctors that can see patients for sick visits, but many do, so it is worth a call to see if yours does. If you don't know where your local health department is, you can check in your phone book. The CDC also has a tool to help you locate your state's health department, and many state websites also have listings of all of their local health departments.  3) Find a Walk-in Clinic If your illness is not likely to be very severe or complicated, you may want to try a walk in clinic. These are popping up all over the country in pharmacies, drugstores, and shopping centers. They're usually staffed by nurse practitioners or physician assistants that have been trained to treat common illnesses and complaints. They're  usually fairly quick and inexpensive. However, if you have serious medical issues or chronic medical problems, these are probably not your best option.  No Primary Care Doctor: - Call Health Connect at  [phone] - they can help you locate a primary care doctor that  accepts your insurance, provides certain services, etc. - Physician Referral Service- [phone]  Chronic Pain Problems: Organization         Address  Phone   Notes  [facility]  [phone] Patients need to be referred by their primary care doctor.   Medication Assistance: Organization         Address  Phone   Notes  [facility] [address] [place], [place] [phone] --Must be a resident of [facility] [place] -- Must have NO insurance coverage whatsoever (no Medicaid/ Medicare, etc.) -- The pt. MUST have a primary care doctor that directs their care regularly and follows them in the community   MedAssist  [phone]   [employer]  [phone]    Agencies that provide inexpensive medical care: Organization         Address  Phone   Notes  [facility]  [phone]   [facility]    [phone]   [facility] Clinic [address] [place], [place] [address] [phone]   [facility] [address] [place] [phone]   Planned Parenthood    [phone]   [facility]    [phone]   Community Health and [facility]  [address], [place] Phone:  [phone], Fax:  [phone] Hours of Operation:  9 am - 6 pm, M-F.  Also accepts Medicaid/Medicare and self-pay.  [facility]  [address], [place] Phone: [phone], Fax: [phone]. Hours of Operation:  8:30 am - 5:30 pm, M-F.  Also accepts Medicaid and self-pay.  [facility] [place] [address], [place] Phone:  [phone]   [facility] [address] Natasha Bence [place], [place] [phone], Ext. [phone] Mondays & Thursdays: 7-9 AM.  First 15 patients are seen on a first come, first serve basis.    Medicaid-accepting [facility] Providers:  Organization         Address  Phone   Notes  [facility] [address],  [phone] Also accepts self-pay patients.  [facility] [address] Laurell Josephs [place], [place]  [phone]   [facility] [address], [place] [phone]   [facility] [address] [place] [phone]   Renaye Rakers [address], [place]   [phone] Only accepts [place] Access [place] patients after they have their name applied to their card.   Self-Pay (no insurance) in [facility]:  Organization         Address  Phone   Notes  Sickle Cell Patients, [facility] [address] [place], [place] [phone]   [facility] [address] [place], [place] [phone]   [facility]  [address], [place] [phone]   [facility]/Dr. Osei-Bonsu  [address], [place] or [address], [place] [phone] Phone number for both [place] and [place] locations is the same.  Urgent Medical and [facility] [address] [place] [phone]   [facility] [address], [place] or [address] [phone]   [facility] [address], [place] [phone], phone; [phone], fax Sees patients 1st and 3rd Saturday of every month.  Must not qualify for public or private insurance (i.e. Medicaid, Medicare, [place] Health Choice, Veterans' Benefits)  Household income should be no more than 200% of the poverty level The clinic cannot treat  you if you are pregnant or think you are pregnant  Sexually transmitted diseases are not treated at the clinic.    Dental Care: Organization         Address  Phone  Notes  [facility] Department of [facility] [address] [place], [place] [phone] Accepts children up to age 41 who are enrolled in [place] or [place] Health Choice; pregnant women with a Medicaid card; and children who have applied for Medicaid or [place] Health Choice, but were declined, whose parents can pay a reduced fee at time of service.  [facility] Department of [facility]  [address], [place] [phone] Accepts children up to age 41 who are enrolled  in Medicaid or [place] Health Choice; pregnant women with a Medicaid card; and children who have applied for Medicaid or [place] Health Choice, but were declined, whose parents can pay a reduced fee at time of service.  [facility]  [address] [place], [place] [phone] Patients are seen by appointment only. Walk-ins are not accepted. [facility] will see patients 11 years of age and older. Monday - Tuesday (8am-5pm) Most Wednesdays (8:30-5pm) $30 per visit, cash only  [facility] Adult Dental Access PRO[facility]  [address], [facility] [phone] Patients are seen by appointment only. Walk-ins are not accepted. [facility] will see patients 25 years of age and older. One Wednesday Evening (Monthly: Volunteer Based).  $30 per visit, cash only  [employer]  [phone] for adults; Children under age 76, call Graduate Pediatric Dentistry at [phone]. Children aged 63-14, please call [phone] to request a pediatric application.  Dental services are provided in all areas of dental care including fillings, crowns and bridges, complete and partial dentures, implants, gum treatment, root canals, and extractions. Preventive care is also provided. Treatment  is provided to both adults and children. Patients are selected via a lottery and there is often a waiting list.   [facility] [address] [place]  [phone] [email or web address]   [facility] [address] [place], [place] [phone], Ext. [phone] Second and Fourth Thursday of each month, opens at 6:30 AM; Clinic ends at 9 AM.  Patients are seen on a first-come first-served basis, and a limited number are seen during each clinic.   [facility]  [address] [place], [place] [phone]   Eligibility Requirements You must have lived in [place], [place], or [place] counties for at least the last three months.   You cannot be eligible for state or federal sponsored [place], including [employer], [place], or [employer].   You generally cannot be eligible for healthcare insurance through your employer.    How to apply: Eligibility screenings are held every Tuesday and Wednesday afternoon from 1:00 pm until 4:00 pm. You do not need an appointment for the interview!  [facility] [address], [place], [place] [phone]   [facility] Health Department  [phone]   [facility] Health Department  [phone]   [facility] Health Department  [phone]    Behavioral Health Resources in the Community: Intensive Outpatient Programs Organization         Address  Phone  Notes  [facility] [address], [place], [place] [phone]   [facility] Outpatient [address], [place], [place] [phone]   [facility] [address] [place], [place]  [phone]   [facility] [address],  [place], [place] [phone] or [phone]   Substance Abuse Resources Organization         Address  Phone  Notes  Alcohol [facility] Drug Services  [phone]   [facility]  [phone]   The  [place]  [phone]   Floydene Flock  [phone]   Residential & Outpatient Substance Abuse Program  [phone]   Psychological Services Organization         Address  Phone  Notes  [facility] Behavioral Health  [phone]   [facility] Services  [phone]   [facility] Mental Health [address], [place] [phone]  or [phone]    Mobile Crisis Teams Organization         Address  Phone  Notes  [facility], Mobile Crisis Care Unit  [phone]   [facility]  [address] [place], [place] [phone]   [facility] [address] [place] [phone]    Self-Help/Support Groups Organization         Address  Phone             Notes  [facility] [place] - variety of support groups  [phone]- I7437963 Call for more information  Narcotics Anonymous (NA), [facility] [address], [employer] [place]  2 meetings at this location   Statistician         Address  Phone  Notes  [facility] [address],    [place]  [phone]   [facility]  [address] [place] [address], [place], [place] [phone]   [facility] [address] [place], [place] [phone] Admissions: 8am-3pm M-F  [facility] [address],    [place], [place] [phone]   [facility] [address] [place], [place], [place] [phone]   [facility] [address],  [place], [place] [phone]   [facility] - Intensive Outpatient [address] Laurell Josephs [address], [place], [place] [phone]   [facility] (Addiction Recovery Care [facility] [address] [place]  [place], [place] [phone] or [phone]   [facility] ([facility]) [address], [place], [place] [phone] Accepts Medicaid  [facility] [address]  [place] [phone] Substance Abuse/Addiction Treatment     [facility] Organization         Address  Phone  Notes  [facility]  [phone]   Angie Fava, PhD [address] Ervin Knack [place], [place]   [phone] or [phone]   [facility] Behavioral   [address] [place], [place] [phone]   [facility] [address], [place], [place] [phone] Insurance/Medicaid/sponsorship through [facility] [address]                                    [place], [place] [phone] Therapy/tele-psych/case  [facility] [address], [place] [phone]    Dr. Lolly Mustache  [phone]   [facility]  United Way [facility] Dept. 1) [address], [place] 2) [address] [place] 3)  [address], [place] [phone]  [phone]   [facility] Child Abuse Hotline [phone])[phone] or [phone] (After Hours)

## 2014-03-27 NOTE — ED Notes (Signed)
Dr. Zavitz made aware of pt's BP 

## 2014-03-27 NOTE — ED Provider Notes (Addendum)
CSN: [id number]     Arrival date & time 03/27/14  0018 History   First MD Initiated Contact with Patient 03/27/14 0024     Chief Complaint  Patient presents with  . Ingestion     (Consider location/radiation/quality/duration/timing/severity/associated sxs/prior Treatment) HPI Comments: 28 year old male with substance abuse history, heroin abuse, alcohol and bipolar history presents after injecting crushed oxycodone 30 mg into his left a.c. around 9 PM tonight. Patient with mild drowsy initially and had mild headache, lightheadedness and nausea. Patient is doing it to get high and has no intention or plan of self injury. Patient feels improved since waiting in ER. Patient denies endocarditis history, new rashes, fevers or chills.  Patient is a 28 y.o. male presenting with Ingested Medication. The history is provided by the patient.  Ingestion Associated symptoms include headaches. Pertinent negatives include no chest pain, no abdominal pain and no shortness of breath.    Past Medical History  Diagnosis Date  . Arthritis   . Diabetes mellitus without complication Borderline  . Overactive bladder   . PTSD (post-traumatic stress disorder)   . Bipolar 1 disorder   . Acid reflux   . 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 Gastritis    History reviewed. No pertinent past surgical history. No family history on file. History  Substance Use Topics  . Smoking status: Current Every Day Smoker -- 0.50 packs/day    Types: Cigarettes  . Smokeless tobacco: Never Used  . Alcohol Use: No    Review of Systems  Constitutional: Positive for appetite change and fatigue. Negative for fever and chills.  HENT: Negative for congestion.   Eyes: Negative for visual disturbance.  Respiratory: Negative for shortness of breath.   Cardiovascular: Negative for chest pain.  Gastrointestinal: Negative for vomiting and abdominal pain.  Genitourinary: Negative for dysuria and flank pain.  Musculoskeletal:  Negative for back pain, neck pain and neck stiffness.  Skin: Negative for rash.  Neurological: Positive for light-headedness and headaches.      Allergies  Ibuprofen and Nsaids  Home Medications   Prior to Admission medications   Medication Sig Start Date End Date Taking? Authorizing Provider  naphazoline-pheniramine (NAPHCON-A) 0.025-0.3 % ophthalmic solution Place 1 drop into the right eye daily as needed for irritation.    Historical Provider, MD  trimethoprim-polymyxin b (POLYTRIM) ophthalmic solution Place 1 drop into the right eye every 4 (four) hours. 03/16/14   Jimmye Normanavid John Smith, NP   BP 100/45  Pulse 76  Temp(Src) 98.8 F (37.1 C) (Oral)  Resp 22  SpO2 95% Physical Exam  Nursing note and vitals reviewed. Constitutional: He is oriented to person, place, and time. He appears well-developed and well-nourished.  HENT:  Head: Normocephalic and atraumatic.  Eyes: Conjunctivae are normal. Right eye exhibits no discharge. Left eye exhibits no discharge.  Neck: Normal range of motion. Neck supple. No tracheal deviation present.  Cardiovascular: Normal rate and regular rhythm.   Pulmonary/Chest: Effort normal and breath sounds normal.  Abdominal: Soft. He exhibits no distension. There is no tenderness. There is no guarding.  Musculoskeletal: He exhibits no edema.  Neurological: He is alert and oriented to person, place, and time. No cranial nerve deficit.  Patient alert and oriented cranial nerves intact. Neck supple. No distress.  Skin: Skin is warm. No rash noted.  Inspected left a.c. with multiple puncture wound sites without signs of infection, soft compartments, no streaking.  Psychiatric: He has a normal mood and affect.  ED Course  Procedures (including critical care time) Labs Review Labs Reviewed  CBC - Abnormal; Notable for the following:    WBC 14.4 (*)    Platelets 134 (*)    All other components within normal limits  BASIC METABOLIC PANEL - Abnormal;  Notable for the following:    Potassium 3.4 (*)    Glucose, Bld 108 (*)    All other components within normal limits  CBG MONITORING, ED - Abnormal; Notable for the following:    Glucose-Capillary 118 (*)    All other components within normal limits    Imaging Review No results found.   EKG Interpretation None      MDM   Final diagnoses:  Opioid dependence  IV drug abuse  Thrombocytopenia  Discussed with poison control who recommended no further observation unless clinically indicated. Screening blood work for possible thrombocytopenia secondary to injection.  Pt well appearing on dc.  Results and differential diagnosis were discussed with the patient/parent/guardian. Close follow up outpatient was discussed, comfortable with the plan.   Medications  acetaminophen (TYLENOL) tablet 650 mg (650 mg Oral Given 03/27/14 0211)  sodium chloride 0.9 % bolus 1,000 mL (1,000 mLs Intravenous New Bag/Given 03/27/14 0212)    Filed Vitals:   03/27/14 1537 03/27/14 0508 03/27/14 0510 03/27/14 0620  BP: 86/60 91/51  99/65  Pulse: 87 62  79  Temp: 97.9 F (36.6 C) 97.9 F (36.6 C) 97.9 F (36.6 C)   TempSrc: Oral Rectal Rectal   Resp: 10   13  SpO2: 98% 97%  98%   Upon discharge patient's blood pressure trended and TEDs. Patient has mild fatigue and nausea symptoms however overall feels okay. Plan for repeat fluid bolus, walking patient around the room, chest x-ray and rectal temperature. Exam no cardiac murmurs, no obvious source of infection on exam. Plan for further observation ER and close followup outpatient. CXR reviewed, no acute findings Patient blood pressure improved during the morning in ER. Afebrile and well-appearing. Followup outpatient discussed     Enid Skeens, MD 03/27/14 9432   Enid Skeens, MD 03/27/14 7614  Enid Skeens, MD 03/27/14 [phone]

## 2014-04-29 ENCOUNTER — Encounter (HOSPITAL_COMMUNITY): Payer: Self-pay | Admitting: Emergency Medicine

## 2014-04-29 ENCOUNTER — Emergency Department (HOSPITAL_COMMUNITY)
Admission: EM | Admit: 2014-04-29 | Discharge: 2014-04-29 | Disposition: A | Discharge status: Other Acute Inpatient Hospital | Payer: Self-pay | Attending: Emergency Medicine | Admitting: Emergency Medicine

## 2014-04-29 ENCOUNTER — Encounter (HOSPITAL_COMMUNITY): Payer: Self-pay | Admitting: *Deleted

## 2014-04-29 ENCOUNTER — Inpatient Hospital Stay (HOSPITAL_COMMUNITY)
Admission: AD | Admit: 2014-04-29 | Discharge: 2014-05-04 | DRG: 897 | Disposition: A | Discharge status: Home / Self Care | Payer: No Typology Code available for payment source | Source: Intra-hospital | Attending: Psychiatry | Admitting: Psychiatry

## 2014-04-29 DIAGNOSIS — F1110 Opioid abuse, uncomplicated: Secondary | ICD-10-CM | POA: Insufficient documentation

## 2014-04-29 DIAGNOSIS — F1210 Cannabis abuse, uncomplicated: Secondary | ICD-10-CM | POA: Diagnosis present

## 2014-04-29 DIAGNOSIS — Z8719 Personal history of other diseases of the digestive system: Secondary | ICD-10-CM | POA: Insufficient documentation

## 2014-04-29 DIAGNOSIS — M129 Arthropathy, unspecified: Secondary | ICD-10-CM | POA: Diagnosis present

## 2014-04-29 DIAGNOSIS — G4700 Insomnia, unspecified: Secondary | ICD-10-CM | POA: Diagnosis present

## 2014-04-29 DIAGNOSIS — K219 Gastro-esophageal reflux disease without esophagitis: Secondary | ICD-10-CM | POA: Diagnosis present

## 2014-04-29 DIAGNOSIS — F411 Generalized anxiety disorder: Secondary | ICD-10-CM | POA: Diagnosis present

## 2014-04-29 DIAGNOSIS — F4310 Post-traumatic stress disorder, unspecified: Secondary | ICD-10-CM | POA: Diagnosis present

## 2014-04-29 DIAGNOSIS — F319 Bipolar disorder, unspecified: Secondary | ICD-10-CM | POA: Diagnosis present

## 2014-04-29 DIAGNOSIS — Z8739 Personal history of other diseases of the musculoskeletal system and connective tissue: Secondary | ICD-10-CM | POA: Insufficient documentation

## 2014-04-29 DIAGNOSIS — R110 Nausea: Secondary | ICD-10-CM | POA: Insufficient documentation

## 2014-04-29 DIAGNOSIS — Z8659 Personal history of other mental and behavioral disorders: Secondary | ICD-10-CM | POA: Insufficient documentation

## 2014-04-29 DIAGNOSIS — F321 Major depressive disorder, single episode, moderate: Secondary | ICD-10-CM | POA: Diagnosis present

## 2014-04-29 DIAGNOSIS — IMO0002 Reserved for concepts with insufficient information to code with codable children: Secondary | ICD-10-CM | POA: Insufficient documentation

## 2014-04-29 DIAGNOSIS — F17200 Nicotine dependence, unspecified, uncomplicated: Secondary | ICD-10-CM | POA: Insufficient documentation

## 2014-04-29 DIAGNOSIS — N318 Other neuromuscular dysfunction of bladder: Secondary | ICD-10-CM | POA: Diagnosis present

## 2014-04-29 DIAGNOSIS — R45851 Suicidal ideations: Secondary | ICD-10-CM

## 2014-04-29 DIAGNOSIS — E119 Type 2 diabetes mellitus without complications: Secondary | ICD-10-CM | POA: Insufficient documentation

## 2014-04-29 DIAGNOSIS — F1994 Other psychoactive substance use, unspecified with psychoactive substance-induced mood disorder: Secondary | ICD-10-CM | POA: Diagnosis present

## 2014-04-29 DIAGNOSIS — F1120 Opioid dependence, uncomplicated: Principal | ICD-10-CM | POA: Diagnosis present

## 2014-04-29 DIAGNOSIS — F410 Panic disorder [episodic paroxysmal anxiety] without agoraphobia: Secondary | ICD-10-CM | POA: Diagnosis present

## 2014-04-29 DIAGNOSIS — R109 Unspecified abdominal pain: Secondary | ICD-10-CM | POA: Insufficient documentation

## 2014-04-29 LAB — COMPREHENSIVE METABOLIC PANEL
ALK PHOS: 113 U/L (ref 39–117)
ALT: 37 U/L (ref 0–53)
AST: 25 U/L (ref 0–37)
Albumin: 3.9 g/dL (ref 3.5–5.2)
Anion gap: 17 — ABNORMAL HIGH (ref 5–15)
BILIRUBIN TOTAL: 0.4 mg/dL (ref 0.3–1.2)
BUN: 12 mg/dL (ref 6–23)
CHLORIDE: 96 meq/L (ref 96–112)
CO2: 22 meq/L (ref 19–32)
Calcium: 10.2 mg/dL (ref 8.4–10.5)
Creatinine, Ser: 0.68 mg/dL (ref 0.50–1.35)
GFR calc Af Amer: >90 mL/min (ref >90)
GFR calc non Af Amer: >90 mL/min (ref >90)
Glucose, Bld: 150 mg/dL — ABNORMAL HIGH (ref 70–99)
POTASSIUM: 4.4 meq/L (ref 3.7–5.3)
Sodium: 135 mEq/L — ABNORMAL LOW (ref 137–147)
Total Protein: 8 g/dL (ref 6.0–8.3)

## 2014-04-29 LAB — ETHANOL: Alcohol, Ethyl (B): <11 mg/dL (ref 0–11)

## 2014-04-29 LAB — SALICYLATE LEVEL: Salicylate Lvl: <2 mg/dL — ABNORMAL LOW (ref 2.8–20.0)

## 2014-04-29 LAB — CBC
HCT: 45.2 % (ref 39.0–52.0)
Hemoglobin: 15.5 g/dL (ref 13.0–17.0)
MCH: 31.3 pg (ref 26.0–34.0)
MCHC: 34.3 g/dL (ref 30.0–36.0)
MCV: 91.3 fL (ref 78.0–100.0)
PLATELETS: 142 10*3/uL — AB (ref 150–400)
RBC: 4.95 MIL/uL (ref 4.22–5.81)
RDW: 12.6 % (ref 11.5–15.5)
WBC: 7.5 10*3/uL (ref 4.0–10.5)

## 2014-04-29 LAB — RAPID URINE DRUG SCREEN, HOSP PERFORMED
Amphetamines: NOT DETECTED
BARBITURATES: NOT DETECTED
BENZODIAZEPINES: NOT DETECTED
Cocaine: NOT DETECTED
Opiates: POSITIVE — AB
TETRAHYDROCANNABINOL: NOT DETECTED

## 2014-04-29 LAB — ACETAMINOPHEN LEVEL

## 2014-04-29 MED ORDER — ONDANSETRON 4 MG PO TBDP: 4.0000 mg | ORAL_TABLET | Freq: Four times a day (QID) | ORAL | Status: DC | PRN

## 2014-04-29 MED ORDER — LORAZEPAM 1 MG PO TABS
1.0000 mg | ORAL_TABLET | ORAL | Status: DC | PRN
  Administered 2014-04-29: 1 mg via ORAL
  Filled 2014-04-29: qty 1

## 2014-04-29 MED ORDER — ALUM & MAG HYDROXIDE-SIMETH 200-200-20 MG/5ML PO SUSP
30.0000 mL | ORAL | Status: DC | PRN
  Administered 2014-05-03: 30 mL via ORAL

## 2014-04-29 MED ORDER — CLONIDINE HCL 0.1 MG PO TABS: 0.1000 mg | ORAL_TABLET | Freq: Every day | ORAL | Status: DC

## 2014-04-29 MED ORDER — HYDROXYZINE HCL 25 MG PO TABS
25.0000 mg | ORAL_TABLET | Freq: Four times a day (QID) | ORAL | Status: DC | PRN
  Administered 2014-04-29 – 2014-05-02 (×3): 25 mg via ORAL
  Filled 2014-04-29 (×5): qty 1

## 2014-04-29 MED ORDER — TRAZODONE HCL 50 MG PO TABS
50.0000 mg | ORAL_TABLET | Freq: Every evening | ORAL | Status: DC | PRN
  Administered 2014-04-29: 50 mg via ORAL
  Filled 2014-04-29 (×2): qty 1

## 2014-04-29 MED ORDER — CLONIDINE HCL 0.1 MG PO TABS: 0.1000 mg | ORAL_TABLET | ORAL | Status: DC

## 2014-04-29 MED ORDER — ACETAMINOPHEN 325 MG PO TABS
650.0000 mg | ORAL_TABLET | Freq: Four times a day (QID) | ORAL | Status: DC | PRN
  Administered 2014-05-03: 650 mg via ORAL
  Filled 2014-04-29: qty 2

## 2014-04-29 MED ORDER — HYDROXYZINE HCL 25 MG PO TABS
25.0000 mg | ORAL_TABLET | Freq: Four times a day (QID) | ORAL | Status: DC | PRN
  Administered 2014-04-29 (×2): 25 mg via ORAL
  Filled 2014-04-29 (×2): qty 1

## 2014-04-29 MED ORDER — LOPERAMIDE HCL 2 MG PO CAPS
2.0000 mg | ORAL_CAPSULE | ORAL | Status: DC | PRN
  Administered 2014-04-30: 4 mg via ORAL
  Filled 2014-04-29: qty 2

## 2014-04-29 MED ORDER — LOPERAMIDE HCL 2 MG PO CAPS: 2.0000 mg | ORAL_CAPSULE | ORAL | Status: DC | PRN

## 2014-04-29 MED ORDER — CLONIDINE HCL 0.1 MG PO TABS
0.1000 mg | ORAL_TABLET | Freq: Four times a day (QID) | ORAL | Status: DC
  Administered 2014-04-29: 0.1 mg via ORAL
  Filled 2014-04-29: qty 1

## 2014-04-29 MED ORDER — DICYCLOMINE HCL 20 MG PO TABS
20.0000 mg | ORAL_TABLET | Freq: Four times a day (QID) | ORAL | Status: DC | PRN
  Administered 2014-04-29: 20 mg via ORAL
  Filled 2014-04-29: qty 1

## 2014-04-29 MED ORDER — METHOCARBAMOL 500 MG PO TABS: 500.0000 mg | ORAL_TABLET | Freq: Three times a day (TID) | ORAL | Status: DC | PRN

## 2014-04-29 MED ORDER — ONDANSETRON HCL 4 MG PO TABS
4.0000 mg | ORAL_TABLET | Freq: Three times a day (TID) | ORAL | Status: DC | PRN
  Administered 2014-04-29: 4 mg via ORAL
  Filled 2014-04-29: qty 1

## 2014-04-29 MED ORDER — NAPROXEN 250 MG PO TABS: 500.0000 mg | ORAL_TABLET | Freq: Two times a day (BID) | ORAL | Status: DC | PRN

## 2014-04-29 MED ORDER — ONDANSETRON 4 MG PO TBDP
4.0000 mg | ORAL_TABLET | Freq: Four times a day (QID) | ORAL | Status: DC | PRN
  Administered 2014-04-30 – 2014-05-02 (×4): 4 mg via ORAL
  Filled 2014-04-29 (×4): qty 1

## 2014-04-29 MED ORDER — CLONIDINE HCL 0.1 MG PO TABS
0.1000 mg | ORAL_TABLET | Freq: Two times a day (BID) | ORAL | Status: DC
  Administered 2014-04-29: 0.1 mg via ORAL
  Filled 2014-04-29: qty 1

## 2014-04-29 MED ORDER — MAGNESIUM HYDROXIDE 400 MG/5ML PO SUSP: 30.0000 mL | Freq: Every day | ORAL | Status: DC | PRN

## 2014-04-29 MED ORDER — DICYCLOMINE HCL 20 MG PO TABS: 20.0000 mg | ORAL_TABLET | Freq: Four times a day (QID) | ORAL | Status: DC | PRN

## 2014-04-29 MED ORDER — CLONIDINE HCL 0.1 MG PO TABS
0.1000 mg | ORAL_TABLET | Freq: Four times a day (QID) | ORAL | Status: DC
  Administered 2014-04-29 – 2014-04-30 (×4): 0.1 mg via ORAL
  Filled 2014-04-29 (×11): qty 1

## 2014-04-29 MED ORDER — PNEUMOCOCCAL VAC POLYVALENT 25 MCG/0.5ML IJ INJ
0.5000 mL | INJECTION | INTRAMUSCULAR | Status: AC
  Administered 2014-05-01: 0.5 mL via INTRAMUSCULAR

## 2014-04-29 MED ORDER — NAPROXEN 500 MG PO TABS
500.0000 mg | ORAL_TABLET | Freq: Two times a day (BID) | ORAL | Status: DC | PRN
  Administered 2014-04-30 – 2014-05-04 (×4): 500 mg via ORAL
  Filled 2014-04-29 (×4): qty 1

## 2014-04-29 MED ORDER — METHOCARBAMOL 500 MG PO TABS
500.0000 mg | ORAL_TABLET | Freq: Three times a day (TID) | ORAL | Status: DC | PRN
  Administered 2014-04-29 – 2014-04-30 (×2): 500 mg via ORAL
  Filled 2014-04-29 (×2): qty 1

## 2014-04-29 MED ORDER — ACETAMINOPHEN 325 MG PO TABS: 650.0000 mg | ORAL_TABLET | ORAL | Status: DC | PRN

## 2014-04-29 NOTE — Progress Notes (Signed)
Admission note:  Pt is 28 year old Caucasion male admitted to the services of Dr. Dub MikesLugo for detox from heroin and opiates.  Pt states he has been using "for years", could not be more specific.  He was also unsure of any length of sobriety he may have had.   Pt states he is unemployed and lives with his girlfriend and his aunt.  Pt lists girlfriend as main support.  Pt states he uses several bags of heroin daily and substitutes pills when heroin is not available to him.  He had moderate withdrawal symptoms during admission process and went to bed immediately following. He lists his girlfriend, Melynda Kellerracy Marshall, as his emergency contact and she may be reached at [phone].

## 2014-04-29 NOTE — ED Notes (Signed)
Cole Morrison called to say they are finding placement at [facility] in patient for him.

## 2014-04-29 NOTE — ED Notes (Signed)
Sitter at bedside.
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
## 2014-04-29 NOTE — ED Notes (Signed)
TTS being completed. 

## 2014-04-29 NOTE — ED Notes (Signed)
Pt here for detox from heroin. Last use yesterday. Pt also reports suicidal Ideation, but does not have a plan.

## 2014-04-29 NOTE — ED Provider Notes (Signed)
CSN: [id number]     Arrival date & time 04/29/14  1242 History   First MD Initiated Contact with Patient 04/29/14 1419     Chief Complaint  Patient presents with  . Drug Problem  . Medical Clearance  . Suicidal     (Consider location/radiation/quality/duration/timing/severity/associated sxs/prior Treatment) HPI Comments: Patient here requesting detox from heroin. He states he uses heroin every day. When I ask him how much he uses he says" a lot". He denies any other drug use other than occasional prescription medication use. He denies any alcohol use. He says that he's depressed, mostly from all his heroin use. He says his room in his life. He says he is having suicidal ideations but denies any specific plan. He feels that he could kill himself with heroin. He does have a history of bipolar disorder and PTSD. He says currently he is having cramping all over and feeling very anxious. He's also having abdominal cramping.  Patient is a 28 y.o. male presenting with drug problem.  Drug Problem Associated symptoms include abdominal pain. Pertinent negatives include no chest pain, no headaches and no shortness of breath.    Past Medical History  Diagnosis Date  . Arthritis   . Diabetes mellitus without complication Borderline  . Overactive bladder   . PTSD (post-traumatic stress disorder)   . Bipolar 1 disorder   . Acid reflux   . 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 Gastritis    History reviewed. No pertinent past surgical history. No family history on file. History  Substance Use Topics  . Smoking status: Current Every Day Smoker -- 0.50 packs/day    Types: Cigarettes  . Smokeless tobacco: Never Used  . Alcohol Use: No    Review of Systems  Constitutional: Negative for fever, chills, diaphoresis and fatigue.  HENT: Negative for congestion, rhinorrhea and sneezing.   Eyes: Negative.   Respiratory: Negative for cough, chest tightness and shortness of breath.   Cardiovascular:  Negative for chest pain and leg swelling.  Gastrointestinal: Positive for nausea and abdominal pain. Negative for vomiting, diarrhea and blood in stool.  Genitourinary: Negative for frequency, hematuria, flank pain and difficulty urinating.  Musculoskeletal: Positive for myalgias. Negative for arthralgias and back pain.  Skin: Negative for rash.  Neurological: Negative for dizziness, speech difficulty, weakness, numbness and headaches.  Psychiatric/Behavioral: Positive for suicidal ideas and agitation.      Allergies  Review of patient's allergies indicates no known allergies.  Home Medications   Prior to Admission medications   Not on File   BP 113/84  Pulse 105  Temp(Src) 97.7 F (36.5 C) (Oral)  Resp 16  Ht 5\' 7"  (1.702 m)  SpO2 98% Physical Exam  Constitutional: He is oriented to person, place, and time. He appears well-developed and well-nourished.  HENT:  Head: Normocephalic and atraumatic.  Eyes: Pupils are equal, round, and reactive to light.  Neck: Normal range of motion. Neck supple.  Cardiovascular: Normal rate, regular rhythm and normal heart sounds.   Pulmonary/Chest: Effort normal and breath sounds normal. No respiratory distress. He has no wheezes. He has no rales. He exhibits no tenderness.  Abdominal: Soft. Bowel sounds are normal. There is no tenderness. There is no rebound and no guarding.  Musculoskeletal: Normal range of motion. He exhibits no edema.  He has track marks in his left forearm. No signs of abscess or cellulitis.  Lymphadenopathy:    He has no cervical adenopathy.  Neurological: He is  alert and oriented to person, place, and time.  Skin: Skin is warm and dry. No rash noted.  Psychiatric: He has a normal mood and affect.    ED Course  Procedures (including critical care time) Labs Review Results for orders placed during the hospital encounter of 04/29/14  ACETAMINOPHEN LEVEL      Result Value Ref Range   Acetaminophen (Tylenol), Serum  <15.0  10 - 30 ug/mL  CBC      Result Value Ref Range   WBC 7.5  4.0 - 10.5 K/uL   RBC 4.95  4.22 - 5.81 MIL/uL   Hemoglobin 15.5  13.0 - 17.0 g/dL   HCT 16.1  09.6 - 04.5 %   MCV 91.3  78.0 - 100.0 fL   MCH 31.3  26.0 - 34.0 pg   MCHC 34.3  30.0 - 36.0 g/dL   RDW 40.9  81.1 - 91.4 %   Platelets 142 (*) 150 - 400 K/uL  COMPREHENSIVE METABOLIC PANEL      Result Value Ref Range   Sodium 135 (*) 137 - 147 mEq/L   Potassium 4.4  3.7 - 5.3 mEq/L   Chloride 96  96 - 112 mEq/L   CO2 22  19 - 32 mEq/L   Glucose, Bld 150 (*) 70 - 99 mg/dL   BUN 12  6 - 23 mg/dL   Creatinine, Ser 7.82  0.50 - 1.35 mg/dL   Calcium 95.6  8.4 - 21.3 mg/dL   Total Protein 8.0  6.0 - 8.3 g/dL   Albumin 3.9  3.5 - 5.2 g/dL   AST 25  0 - 37 U/L   ALT 37  0 - 53 U/L   Alkaline Phosphatase 113  39 - 117 U/L   Total Bilirubin 0.4  0.3 - 1.2 mg/dL   GFR calc non Af Amer >90  >90 mL/min   GFR calc Af Amer >90  >90 mL/min   Anion gap 17 (*) 5 - 15  ETHANOL      Result Value Ref Range   Alcohol, Ethyl (B) <11  0 - 11 mg/dL  SALICYLATE LEVEL      Result Value Ref Range   Salicylate Lvl <2.0 (*) 2.8 - 20.0 mg/dL   No results found.   Imaging Review No results found.   EKG Interpretation None      MDM   Final diagnoses:  Heroin abuse  Suicidal ideation   We'll consult TTS.  start on clonidine.  Move to the psych ED.    Rolan Bucco, MD 04/29/14 [phone]

## 2014-04-30 ENCOUNTER — Encounter (HOSPITAL_COMMUNITY): Payer: Self-pay | Admitting: Psychiatry

## 2014-04-30 DIAGNOSIS — F39 Unspecified mood [affective] disorder: Secondary | ICD-10-CM

## 2014-04-30 MED ORDER — QUETIAPINE FUMARATE 100 MG PO TABS
100.0000 mg | ORAL_TABLET | Freq: Every day | ORAL | Status: DC
  Administered 2014-05-01 – 2014-05-03 (×3): 100 mg via ORAL
  Filled 2014-04-30: qty 1
  Filled 2014-04-30: qty 14
  Filled 2014-04-30 (×4): qty 1
  Filled 2014-04-30: qty 14

## 2014-04-30 MED ORDER — TRAZODONE HCL 100 MG PO TABS
100.0000 mg | ORAL_TABLET | Freq: Every evening | ORAL | Status: DC | PRN
  Administered 2014-05-01 – 2014-05-03 (×3): 100 mg via ORAL
  Filled 2014-04-30 (×5): qty 1
  Filled 2014-04-30: qty 14

## 2014-04-30 MED ORDER — LORAZEPAM 1 MG PO TABS
1.0000 mg | ORAL_TABLET | Freq: Four times a day (QID) | ORAL | Status: DC | PRN
  Administered 2014-04-30 – 2014-05-04 (×8): 1 mg via ORAL
  Filled 2014-04-30 (×8): qty 1

## 2014-04-30 MED ORDER — ENSURE COMPLETE PO LIQD
237.0000 mL | Freq: Three times a day (TID) | ORAL | Status: DC
  Administered 2014-04-30 – 2014-05-04 (×12): 237 mL via ORAL

## 2014-04-30 MED ORDER — QUETIAPINE FUMARATE 50 MG PO TABS
50.0000 mg | ORAL_TABLET | Freq: Two times a day (BID) | ORAL | Status: DC
  Administered 2014-04-30 – 2014-05-04 (×9): 50 mg via ORAL
  Filled 2014-04-30 (×2): qty 28
  Filled 2014-04-30 (×7): qty 1
  Filled 2014-04-30 (×2): qty 28
  Filled 2014-04-30 (×5): qty 1
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
## 2014-04-30 NOTE — B[facility] Group Notes (Signed)
[facility] LCSW Group Therapy  04/30/2014 2:53 PM  Type of Therapy:  Group Therapy  Participation Level:  Did Not Attend-pt in room sleeping.   Smart, Nattaly Yebra LCSWA  04/30/2014, 2:53 PM

## 2014-04-30 NOTE — [facility] Group Notes (Signed)
[facility] LCSW Aftercare Discharge Planning Group Note   04/30/2014 10:09 AM  Participation Quality:  DID NOT ATTEND-pt in room sleeping.   Smart, American FinancialHeather LCSWA
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
## 2014-04-30 NOTE — Progress Notes (Signed)
NUTRITION ASSESSMENT  Pt identified as at risk on the Malnutrition Screen Tool  INTERVENTION: 1. Educated patient on the importance of nutrition and encouraged intake of food and beverages. 2. Discussed weight goals. 3. Supplements: Ensure Complete po TID, each supplement provides 350 kcal and 13 grams of protein    NUTRITION DIAGNOSIS: Unintentional weight loss related to sub-optimal intake as evidenced by pt report.   Goal: Pt to meet >/= 90% of their estimated nutrition needs.  Monitor:  PO intake  Assessment:  Patient admitted with heroine and opiod abuse.  Hx includes BP, PTSD, borderline DM.  Patient known from last admit in March 2015.  At that time was passive suicide by not eating.  Patient reports poor intake currently secondary to detox.  UBW of 140-150 lbs per patient (not confirmed by e-chart).  Patient poor historian with time.  Reported he was last her in the fall.  Weight 130 lbs 03/16/14 decreased to 121 lbs 04/29/14 due to continued drug abuse and poor intake.    Patient continues to meet criteria for severe malnutrition related to social and environmental causes AEB intake <50% for > 1 month and decreased body fat and muscle mass.    28 y.o. male  Height: Ht Readings from Last 1 Encounters:  04/29/14 5\' 7"  (1.702 m)    Weight: Wt Readings from Last 1 Encounters:  04/29/14 121 lb (54.885 kg)    Weight Hx: Wt Readings from Last 10 Encounters:  04/29/14 121 lb (54.885 kg)  03/16/14 130 lb (58.968 kg)  01/15/14 123 lb (55.792 kg)  01/15/14 127 lb (57.607 kg)  11/03/13 126 lb (57.153 kg)  09/19/13 131 lb (59.421 kg)  04/05/13 124 lb (56.246 kg)    BMI:  Body mass index is 18.95 kg/(m^2). Pt meets criteria for normal weight based on current BMI.  Estimated Nutritional Needs: Kcal: 25-30 kcal/kg Protein: > 1 gram protein/kg Fluid: 1 ml/kcal  Diet Order: General Pt is also offered choice of unit snacks mid-morning and mid-afternoon.  Pt is eating  as desired.   Lab results and medications reviewed.   Marland Kitchen.Michella Detjen

## 2014-04-30 NOTE — Progress Notes (Addendum)
Patient ID: Cole Morrison, male   DOB: Dec 01, 1985, 28 y.o.   MRN: [id number]  D: Patient calm on approach. Affect flat this am. Reports having a lot of withdrawal symptoms including: tremors, nausea, generalized body aches, cravings, anxiety, and agitation. Patient currently on the clonidine protocol but says he doesn't feel like it is helping much yet. Waiting to speak with physician about other possible medications for him. Using a large amount of heroin and opiates prior to admission. Robaxin, Zofran, and Vistaril given thus far prn. Reports depression and feelings of hopelessness "6". Reports some passive SI on and off. A: Staff will monitor on q 15 minute checks, follow treatment plan, and give meds as ordered.  R: Cooperative on the unit today
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
## 2014-05-01 DIAGNOSIS — F411 Generalized anxiety disorder: Secondary | ICD-10-CM

## 2014-05-01 DIAGNOSIS — F1994 Other psychoactive substance use, unspecified with psychoactive substance-induced mood disorder: Secondary | ICD-10-CM

## 2014-05-01 MED ORDER — NICOTINE 21 MG/24HR TD PT24
21.0000 mg | MEDICATED_PATCH | Freq: Every day | TRANSDERMAL | Status: DC
  Administered 2014-05-01 – 2014-05-04 (×4): 21 mg via TRANSDERMAL
  Filled 2014-05-01 (×6): qty 1

## 2014-05-01 MED ORDER — BUPRENORPHINE HCL 2 MG SL SUBL
4.0000 mg | SUBLINGUAL_TABLET | Freq: Two times a day (BID) | SUBLINGUAL | Status: DC
  Administered 2014-05-01 – 2014-05-03 (×5): 4 mg via SUBLINGUAL
  Filled 2014-05-01 (×5): qty 2

## 2014-05-01 NOTE — Clinical Social Work Note (Signed)
CSW met with pt individually to discuss aftercare plan. Pt shared that he cannot go to [facility] because "the program is too long." Pt not able to return to [facility]-CSW verified pt ineligable for [facility] admission due to prior behavioral issues. Pt plans to return home, attend NA[facility] groups, [facility] for med management. CSW assessing for appropriate referrals.   [place], [place] 05/01/2014 3:10 PM

## 2014-05-01 NOTE — Progress Notes (Signed)
Patient ID: Cole Morrison, male   DOB: 1986/07/16, 28 y.o.   MRN: [id number] He has been in bed in bed this morning and to groups this afternoon. He has been talking about kicking a Animal nutritionistguy(dealer) out of his house, getting read of all his friends and concentrating on his family.   Self inventory: Depression 6 hopeless6, withdrawal of agitation, chilling. Tremors  And he denies SI thoughts.

## 2014-05-01 NOTE — Tx Team (Signed)
Interdisciplinary Treatment Plan Update (Adult)  Date: 05/01/2014   Time Reviewed: 10:57 AM  Progress in Treatment:  Attending groups: No.  Participating in groups:  No.  Taking medication as prescribed: Yes  Tolerating medication: Yes  Family/Significant othe contact made: Not yet. SPE required for this pt.   Patient understands diagnosis: Yes, AEB seeking treatment for heroin detox, mood stabilization, med management, and passive SI.  Discussing patient identified problems/goals with staff: Yes  Medical problems stabilized or resolved: Yes  Denies suicidal/homicidal ideation: Yes during self report.  Patient has not harmed self or Others: Yes  New problem(s) identified:  Discharge Plan or Barriers: Pt not attending d/c planning at this time. During initial assessment, showed interest in longterm tx and reported that [facility]. CSW assessing for appropriate referrals.  Additional comments: [name] who states that after he was here and was D/C in April he did fine for a while. He relapsed few months ago. Using heroin IV every day 2-3 bags a day. States things got really bad, he was wanting to kill himself. He was given an Chief Operating Officerultimatum by his girlfriend.  Reason for Continuation of Hospitalization: Subutex-withdrawals Mood stabilization Med management Estimated length of stay: 3-5 days  For review of initial/current patient goals, please see plan of care.  Attendees:  Patient:    Family:    Physician: Geoffery LyonsIrving Lugo MD 05/01/2014. 10:56 AM   Nursing: Deeann Creeonna S. RN 05/01/2014 10:56 AM   Clinical Social Worker Farron Watrous Smart, LCSWA  05/01/2014 10:56 AM   Other: Meryl DareJennifer P. RN  05/01/2014 10:56 AM   Other: Chandra BatchAggie N. PA 05/01/2014 10:56 AM   Other: Darden DatesJennifer C. Nurse CM 05/01/2014 10:56 AM   Other:    Scribe for Treatment Team:  Trula SladeHeather Smart LCSWA 05/01/2014 10:57 AM

## 2014-05-01 NOTE — [facility] Group Notes (Signed)
[facility] Group Notes:  (Nursing/MHT/Case Management/Adjunct)  Date:  05/01/2014  Time:  0845  Type of Therapy:  Psychoeducational Skills  Participation Level:  Did not attend  Participation Quality:    Affect:    Cognitive:    Insight:    Engagement in Group:    Modes of Intervention:   Morning wellness group; Sleep Hygiene   Summary of Progress/Problems:  Cole Morrison, Cole Morrison 05/01/2014, 12:45 PM

## 2014-05-01 NOTE — [facility] Group Notes (Signed)
[facility] LCSW Group Therapy  05/01/2014 2:57 PM  Type of Therapy:  Group Therapy  Participation Level:  Active  Participation Quality:  Attentive  Affect:  Appropriate  Cognitive:  Alert and Oriented  Insight:  Improving  Engagement in Therapy:  Improving  Modes of Intervention:  Confrontation, Discussion, Education, Exploration, Problem-solving, Rapport Building, Socialization and Support  Summary of Progress/Problems: MHA Speaker came to talk about his personal journey with substance abuse and addiction. The pt processed ways by which to relate to the speaker. MHA speaker provided handouts and educational information pertaining to groups and services offered by the [facility].   Smart, Cole Morrison LCSWA  05/01/2014, 2:57 PM

## 2014-05-01 NOTE — Progress Notes (Signed)
[facility]  05/01/2014 6:36 PM Cole Morrison  MRN:  [id number] Subjective:  States he is in full withdrawal. He was not able to get the clonidine as his BP was very low. He is nauseated, sweating, with aches and pains, did not sleep last night Diagnosis:   DSM5: Schizophrenia Disorders:  none Obsessive-Compulsive Disorders:  none Trauma-Stressor Disorders:  none Substance/Addictive Disorders:  Opioid Disorder - Severe (304.00) Depressive Disorders:  Major Depressive Disorder - Moderate (296.22) Total Time spent with patient: 30 minutes  Axis I: Anxiety Disorder NOS, Mood Disorder NOS and Substance Induced Mood Disorder  ADL's:  Intact  Sleep: Poor  Appetite:  Poor  Suicidal Ideation:  Plan:  denies Intent:  denies Means:  denies Homicidal Ideation:  Plan:  denies Intent:  denies Means:  denies AEB (as evidenced by):  Psychiatric Specialty Exam: Physical Exam  Review of Systems  Constitutional: Positive for malaise/fatigue and diaphoresis.  HENT: Negative.   Eyes: Negative.   Respiratory: Negative.   Cardiovascular: Negative.   Gastrointestinal: Negative.   Genitourinary: Negative.   Musculoskeletal: Positive for myalgias.  Skin: Negative.   Neurological: Positive for weakness.  Endo/Heme/Allergies: Negative.   Psychiatric/Behavioral: Positive for depression and substance abuse. The patient is nervous/anxious and has insomnia.     Blood pressure 127/86, pulse 99, temperature 97.6 F (36.4 C), temperature source Oral, resp. rate 20, height 5\' 7"  (1.702 m), weight 54.885 kg (121 lb).Body mass index is 18.95 kg/(m^2).  General Appearance: Disheveled  Eye SolicitorContact::  Fair  Speech:  Clear and Coherent  Volume:  Decreased  Mood:  Anxious, Depressed, Dysphoric and "in pain"  Affect:  anxious, in pain  Thought Process:  Coherent and Goal Directed  Orientation:  Full (Time, Place, and Person)  Thought Content:  symptoms worries concerns  Suicidal Thoughts:   No  Homicidal Thoughts:  No  Memory:  Immediate;   Fair Recent;   Fair Remote;   Fair  Judgement:  Fair  Insight:  Present  Psychomotor Activity:  Restlessness  Concentration:  Fair  Recall:  F[employer]Fair  Fund of Knowledge:NA  Language: Fair  Akathisia:  No  Handed:    AIMS (if indicated):     Assets:  Desire for Improvement Social Support  Sleep:  Number of Hours: 3.5   Musculoskeletal: Strength & Muscle Tone: within normal limits Gait & Station: normal Patient leans: N/A  Current Medications: Current Facility-Administered Medications  Medication Dose Route Frequency Provider Last Rate Last Dose  . acetaminophen (TYLENOL) tablet 650 mg  650 mg Oral Q6H PRN Kristeen MansFran E Hobson, NP      . alum & mag hydroxide-simeth (MAALOX/MYLANTA) 200-200-20 MG/5ML suspension 30 mL  30 mL Oral Q4H PRN Kristeen MansFran E Hobson, NP      . buprenorphine (SUBUTEX) SL tablet 4 mg  4 mg Sublingual BID Rachael FeeIrving A Dorla Guizar, MD   4 mg at 05/01/14 1705  . dicyclomine (BENTYL) tablet 20 mg  20 mg Oral Q6H PRN Kristeen MansFran E Hobson, NP   20 mg at 04/29/14 2132  . feeding supplement (ENSURE COMPLETE) (ENSURE COMPLETE) liquid 237 mL  237 mL Oral TID BM Jeoffrey MassedLaura Lee Jobe, RD   237 mL at 05/01/14 1303  . hydrOXYzine (ATARAX/VISTARIL) tablet 25 mg  25 mg Oral Q6H PRN Kristeen MansFran E Hobson, NP   25 mg at 04/30/14 0950  . loperamide (IMODIUM) capsule 2-4 mg  2-4 mg Oral PRN Kristeen MansFran E Hobson, NP   4 mg at 04/30/14 1208  . LORazepam (ATIVAN)  tablet 1 mg  1 mg Oral Q6H PRN Rachael Fee, MD   1 mg at 05/01/14 1047  . magnesium hydroxide (MILK OF MAGNESIA) suspension 30 mL  30 mL Oral Daily PRN Kristeen Mans, NP      . methocarbamol (ROBAXIN) tablet 500 mg  500 mg Oral Q8H PRN Kristeen Mans, NP   500 mg at 04/30/14 0831  . naproxen (NAPROSYN) tablet 500 mg  500 mg Oral BID PRN Kristeen Mans, NP   500 mg at 04/30/14 1116  . nicotine (NICODERM CQ - dosed in mg/24 hours) patch 21 mg  21 mg Transdermal Q0600 Rachael Fee, MD   21 mg at 05/01/14 1052  . ondansetron  (ZOFRAN-ODT) disintegrating tablet 4 mg  4 mg Oral Q6H PRN Kristeen Mans, NP   4 mg at 05/01/14 1047  . QUEtiapine (SEROQUEL) tablet 100 mg  100 mg Oral QHS Rachael Fee, MD      . QUEtiapine (SEROQUEL) tablet 50 mg  50 mg Oral BID Rachael Fee, MD   50 mg at 05/01/14 1705  . traZODone (DESYREL) tablet 100 mg  100 mg Oral QHS PRN Rachael Fee, MD        Lab Results: No results found for this or any previous visit (from the past 48 hour(s)).  Physical Findings: AIMS: Facial and Oral Movements Muscles of Facial Expression: None, normal Lips and Perioral Area: None, normal Jaw: None, normal Tongue: None, normal,Extremity Movements Upper (arms, wrists, hands, fingers): None, normal Lower (legs, knees, ankles, toes): None, normal, Trunk Movements Neck, shoulders, hips: None, normal, Overall Severity Severity of abnormal movements (highest score from questions above): None, normal Incapacitation due to abnormal movements: None, normal Patient's awareness of abnormal movements (rate only patient's report): No Awareness, Dental Status Current problems with teeth and/or dentures?: No Does patient usually wear dentures?: No  CIWA:    COWS:  COWS Total Score: 4  Treatment Plan Summary: Daily contact with patient to assess and evaluate symptoms and progress in treatment Medication management  Plan: Supportive approach/coping skills/relapse prevention           Subutex 4 mg BID and reassess  Medical Decision Making Problem Points:  Established problem, worsening (2) and Review of psycho-social stressors (1) Data Points:  Review of medication regiment & side effects (2) Review of new medications or change in dosage (2)  I certify that inpatient services furnished can reasonably be expected to improve the patient's condition.   Kamiyah Kindel A 05/01/2014, 6:36 PM

## 2014-05-01 NOTE — Progress Notes (Signed)
D: Pt is flat in affect and depressed in mood. Pt complains of nausea, hot flashes, cold chills,insomnia, profuse sweating, and generalized body aches. Pt is Pt presents hypotensive this evening. Pt given a water pitcher and a cup of Gatorade. Pt consumed a can of Gatorade at the med window. On-call extender consulted in reference to withholding any meds or prns beyond the clonidine. On-call extender ordered writer to not administer any meds at this time. Pt has been informed of plan of care. Pt is requesting Subutex for his detox. Writer spoke with pt on how another alternative medication maybe ordered, but only upon review and ordering [facility].  D: Pt is in affect and in mood. Pt rates her depression at a level out of ten and her anxiety as a . Pt attended group this evening. Pt observed interacting appropriately within the milieu.  A: Continued support and availability as needed was extended to this pt. Staff continue to monitor pt with q6015min checks. Vitals rechecked.  R: Pt receptive to treatment. Pt remains safe at this time. Bp improved on last check at 0145. Pt's most recent Bp was 102/56 HR 67 sitting and 99/63 HR 78 standing.

## 2014-05-01 NOTE — Progress Notes (Signed)
Recreation Therapy Notes  Animal-Assisted Activity/Therapy (AAA/T) Program Checklist/Progress Notes Patient Eligibility Criteria Checklist & Daily Group note for Rec Tx Intervention  Date: 07.14.2015 Time: 2:45pm Location: [address] Dayroom    AAA/T Program Assumption of Risk Form signed by Patient/ or Parent Legal Guardian yes  Patient is free of allergies or sever asthma yes  Patient reports no fear of animals yes  Patient reports no history of cruelty to animals yes   Patient understands his/her participation is voluntary yes  Behavioral Response: Did not attend.   [name] L [name], LRT/CTRS  [name] L 05/01/2014 5:02 PM 

## 2014-05-01 NOTE — Progress Notes (Signed)
Patient attended AA meeting. Tonight we had a speaker meeting. They actively listened. Cole Morrison, Enzley Kitchens A 10:26 PM

## 2014-05-01 NOTE — Progress Notes (Signed)
Patient ID: Cole Morrison, male   DOB: 09-15-1986, 28 y.o.   MRN: [id number]  D: Pt was pleasant but anxious during the assessment. Pt's main focused appeared to be on his meds, AEB him asking for several meds which included naproxsyn, robaxin, ativan, zofran, and vistaril. Writer spoke to pt about meds in an effort to educate about the uses. Pt acknowledged understanding and informed writer that he's under "a lot of stress." Stated, "things happening at home that he can't do anything about".   A: Encouraged pt to try and focus on his car. Gave pt 2 prn's and offered support encouragement. 15 min checks continued for safety.   R: Pt remains safe.

## 2014-05-01 NOTE — Progress Notes (Signed)
Pt was hypotensive this evening and required his clonidine and other scheduled meds to be held. Pt has been complaint with increasing his fluid intake. On-call extender contact who ordered writer to hold meds at this time. Pt complains of nausea, hot flashes, cold chills,insomnia, and generalized body aches.

## 2014-05-02 NOTE — [facility] Group Notes (Signed)
[facility]   05/02/2014 10:06 AM  Participation Quality:  DID NOT ATTEND-pt sleeping in room   Smart, Keoki Mchargue LCSWA

## 2014-05-02 NOTE — Progress Notes (Signed)
[facility]  05/02/2014 3:44 PM Cole Morrison  MRN:  [id number] Subjective:  Cole Morrison endorses that the Subutex is helping greatly to take care of a lot of the withdrawal symptoms but he still feel somewhat agitated "shaky." He states he is wanting to get his life back together. His employer told him that if he is going to stay quit and not being sick and losing work he is willing to get him back to work. He could go back on Monday. His girlfriend is willing to give him another chance. He can go back with her. He spoke with his son and promised his son things are going to be different. 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  HENT: Negative.   Eyes: Negative.   Respiratory: Negative.   Cardiovascular: Negative.   Gastrointestinal: Negative.   Genitourinary: Negative.   Musculoskeletal: Negative.   Skin: Negative.   Neurological: Positive for weakness.  Endo/Heme/Allergies: Negative.   Psychiatric/Behavioral: Positive for depression and substance abuse. The patient is nervous/anxious.     Blood pressure 115/71, pulse 103, temperature 98.4 F (36.9 C), temperature source Oral, resp. rate  19, height 5\' 7"  (1.702 m), weight 54.885 kg (121 lb).Body mass index is 18.95 kg/(m^2).  General Appearance: Fairly Groomed  Patent attorneyye Contact::  Fair  Speech:  Clear and Coherent  Volume:  fluctuates  Mood:  Anxious and worried  Affect:  anxious, worried  Thought Process:  Coherent and Goal Directed  Orientation:  Full (Time, Place, and Person)  Thought Content:  symptoms worries concerns  Suicidal Thoughts:  No  Homicidal Thoughts:  No  Memory:  Immediate;   Fair Recent;   Fair Remote;   Fair  Judgement:  Fair  Insight:  Present  Psychomotor Activity:  Restlessness  Concentration:  Fair  Recall:  F[employer]Fair  Fund of Knowledge:NA  Language: Fair  Akathisia:  No  Handed:    AIMS (if indicated):     Assets:  Desire for Improvement  Sleep:  Number of Hours: 6.75   Musculoskeletal: Strength & Muscle Tone: within normal limits Gait & Station: normal Patient leans: N/A  Current Medications: Current Facility-Administered Medications  Medication Dose Route Frequency Provider Last Rate Last Dose  . acetaminophen (TYLENOL) tablet 650 mg  650 mg Oral Q6H PRN Kristeen MansFran E Hobson, NP      . alum & mag hydroxide-simeth (MAALOX/MYLANTA) 200-200-20 MG/5ML suspension 30 mL  30 mL Oral Q4H PRN Kristeen MansFran E Hobson, NP      . buprenorphine (SUBUTEX) SL tablet 4 mg  4 mg Sublingual BID Rachael FeeIrving A Alhaji Mcneal, MD   4 mg at 05/02/14 0859  . dicyclomine (BENTYL) tablet 20 mg  20 mg Oral Q6H PRN Kristeen MansFran E Hobson, NP   20 mg at 04/29/14 2132  .  feeding supplement (ENSURE COMPLETE) (ENSURE COMPLETE) liquid 237 mL  237 mL Oral TID BM Jeoffrey Massed, RD   237 mL at 05/02/14 1404  . hydrOXYzine (ATARAX/VISTARIL) tablet 25 mg  25 mg Oral Q6H PRN Kristeen Mans, NP   25 mg at 05/02/14 1403  . loperamide (IMODIUM) capsule 2-4 mg  2-4 mg Oral PRN Kristeen Mans, NP   4 mg at 04/30/14 1208  . LORazepam (ATIVAN) tablet 1 mg  1 mg Oral Q6H PRN Rachael Fee, MD   1 mg at 05/02/14 0901  . magnesium hydroxide (MILK OF MAGNESIA) suspension 30 mL   30 mL Oral Daily PRN Kristeen Mans, NP      . methocarbamol (ROBAXIN) tablet 500 mg  500 mg Oral Q8H PRN Kristeen Mans, NP   500 mg at 04/30/14 0831  . naproxen (NAPROSYN) tablet 500 mg  500 mg Oral BID PRN Kristeen Mans, NP   500 mg at 05/01/14 2010  . nicotine (NICODERM CQ - dosed in mg/24 hours) patch 21 mg  21 mg Transdermal Q0600 Rachael Fee, MD   21 mg at 05/02/14 6962  . ondansetron (ZOFRAN-ODT) disintegrating tablet 4 mg  4 mg Oral Q6H PRN Kristeen Mans, NP   4 mg at 05/01/14 1047  . QUEtiapine (SEROQUEL) tablet 100 mg  100 mg Oral QHS Rachael Fee, MD   100 mg at 05/01/14 2207  . QUEtiapine (SEROQUEL) tablet 50 mg  50 mg Oral BID Rachael Fee, MD   50 mg at 05/02/14 0859  . traZODone (DESYREL) tablet 100 mg  100 mg Oral QHS PRN Rachael Fee, MD   100 mg at 05/01/14 2209    Lab Results: No results found for this or any previous visit (from the past 48 hour(s)).  Physical Findings: AIMS: Facial and Oral Movements Muscles of Facial Expression: None, normal Lips and Perioral Area: None, normal Jaw: None, normal Tongue: None, normal,Extremity Movements Upper (arms, wrists, hands, fingers): None, normal Lower (legs, knees, ankles, toes): None, normal, Trunk Movements Neck, shoulders, hips: None, normal, Overall Severity Severity of abnormal movements (highest score from questions above): None, normal Incapacitation due to abnormal movements: None, normal Patient's awareness of abnormal movements (rate only patient's report): No Awareness, Dental Status Current problems with teeth and/or dentures?: No Does patient usually wear dentures?: No  CIWA:    COWS:  COWS Total Score: 0  Treatment Plan Summary: Daily contact with patient to assess and evaluate symptoms and progress in treatment Medication management  Plan: Supportive approach/coping skills/relapse prevention           Pursue Subutex detox           Reassess and address the co morbidities  Medical Decision  Making Problem Points:  Review of psycho-social stressors (1) Data Points:  Review of medication regiment & side effects (2) Review of new medications or change in dosage (2)  I certify that inpatient services furnished can reasonably be expected to improve the patient's condition.   Leesha Veno A 05/02/2014, 3:44 PM

## 2014-05-02 NOTE — Progress Notes (Signed)
Patient ID: Cole CompWestley Morrison, male   DOB: 1985/12/01, 28 y.o.   MRN: [id number]  D: Pt informed the writer that he'd "just gotten some bad news". Stated, he spoke with his attorney whom informed him that his "paperwork" didn't go thru. However, pt stated he's been on disability for 4 yrs and "nothing has been accomplished". Then pt stated, "people won't get out of my house. He shooting up around my 28 yr old son". Writer encouraged pt to think about sobriety, and that the best time to handle the situation at home may be once he's discharged.   A:  Support and encouragement was offered. 15 min checks continued for safety.  R: Pt remains safe.

## 2014-05-02 NOTE — [facility] Group Notes (Signed)
[facility] LCSW Group Therapy  05/02/2014 3:10 PM  Type of Therapy:  Group Therapy  Participation Level:  Active  Participation Quality:  Attentive  Affect:  Appropriate  Cognitive:  Alert and Oriented  Insight:  Improving  Engagement in Therapy:  Improving  Modes of Intervention:  Confrontation, Discussion, Education, Exploration, Problem-solving, Rapport Building, Socialization and Support  Summary of Progress/Problems: Emotion Regulation: This group focused on both positive and negative emotion identification and allowed group members to process ways to identify feelings, regulate negative emotions, and find healthy ways to manage internal/external emotions. Group members were asked to reflect on a time when their reaction to an emotion led to a negative outcome and explored how alternative responses using emotion regulation would have benefited them. Group members were also asked to discuss a time when emotion regulation was utilized when a negative emotion was experienced. Cole Morrison was attentive and engaged throughout today's therapy group. He shared that he struggles with depression and shame. Cole Morrison shows progress in the group setting and improving insight AEB his ability to identify how these negative emotions feels both physically and mentally. He shared that his girlfriend is his only positive support and a lot of his shame/guilt surrounds their relationship. Cole Morrison stated that he has made new rules regarding "no substance abuse rule in the house" and set goals for himself to help with emotion regulation-"attend NA meeting daily, get a sponsor, and communicate with my girlfriend when I am struggling with negative emotions."   Smart, Cole Morrison LCSWA  05/02/2014, 3:10 PM

## 2014-05-02 NOTE — Progress Notes (Signed)
Pt attended NA group this evening.  

## 2014-05-02 NOTE — Progress Notes (Signed)
Patient ID: Cole Morrison, male   DOB: 1985-11-27, 28 y.o.   MRN: [id number] He has been up and about  Interacting with peers and staff. Has has a very flat affect. He has requested and received prn for anxiety twice today. I have not noted his having any vieable tremors today. Self inventory: depression 6, hopelessness 6, denied anxiety, w/d's of agitation runny nose, chilling, cramping, and irritability, Has not requested any  Prn except for anxiety. He denies SI thoughts. Goal is"to be drug free and help my family".

## 2014-05-03 DIAGNOSIS — F411 Generalized anxiety disorder: Secondary | ICD-10-CM

## 2014-05-03 MED ORDER — BUPRENORPHINE HCL 2 MG SL SUBL
2.0000 mg | SUBLINGUAL_TABLET | Freq: Two times a day (BID) | SUBLINGUAL | Status: DC
  Administered 2014-05-03 – 2014-05-04 (×2): 2 mg via SUBLINGUAL
  Filled 2014-05-03 (×2): qty 1

## 2014-05-03 NOTE — Progress Notes (Signed)
[facility]  05/03/2014 3:57 PM Rodman CompWestley Driscoll  MRN:  [id number] Subjective:  Cole Morrison states he is ready to come off the Subutex. He still has some minor symptoms but admits that it is not like it was when he came here. He is concerned about his chronic relapses and what he needs to do differently. Would like to eventually be considered for Suboxone maintenance. States he is not going to get too many more chances from his girlfriend or his job. That is why he wants to be sure this time around he does the right thing.  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   HENT: Negative.   Eyes: Negative.   Respiratory: Negative.   Cardiovascular: Negative.   Gastrointestinal: Negative.   Genitourinary: Negative.   Musculoskeletal: Negative.   Skin: Negative.   Neurological: Negative.   Endo/Heme/Allergies: Negative.   Psychiatric/Behavioral: Positive for substance abuse. The patient is nervous/anxious.     Blood pressure 110/61, pulse 102, temperature 97.7 F (36.5 C), temperature source Oral, resp. rate 16, height 5\' 7"  (1.702 m), weight 54.885 kg (121 lb).Body mass index is 18.95 kg/(m^2).  General Appearance: Fairly Groomed  Patent attorneyye Contact::  Fair  Speech:  Clear and Coherent  Volume:  fluctuates  Mood:  Anxious and worried   Affect:  anxious  Thought Process:  Coherent and Goal Directed  Orientation:  Full (Time, Place, and Person)  Thought Content:  worries, concerns  Suicidal Thoughts:  No  Homicidal Thoughts:  No  Memory:  Immediate;   Fair Recent;   Fair Remote;   Fair  Judgement:  Fair  Insight:  Present  Psychomotor Activity:  Restlessness  Concentration:  Fair  Recall:  FiservFair  Fund of Knowledge:NA  Language: Fair  Akathisia:  No  Handed:    AIMS (if indicated):     Assets:  Desire for Improvement Housing Social Support Vocational/Educational  Sleep:  Number of Hours: 6   Musculoskeletal: Strength & Muscle Tone: within normal limits Gait & Station: normal Patient leans: N/A  Current Medications: Current Facility-Administered Medications  Medication Dose Route Frequency Provider Last Rate Last Dose  . acetaminophen (TYLENOL) tablet 650 mg  650 mg Oral Q6H PRN Kristeen MansFran E Hobson, NP      . alum & mag hydroxide-simeth (MAALOX/MYLANTA) 200-200-20 MG/5ML suspension 30 mL  30 mL Oral Q4H PRN Kristeen MansFran E Hobson, NP      . buprenorphine (SUBUTEX) SL tablet 2 mg  2 mg Sublingual BID Rachael FeeIrving A Remona Boom, MD      . dicyclomine (BENTYL) tablet 20 mg  20 mg Oral Q6H PRN Kristeen MansFran E Hobson, NP   20 mg at 04/29/14 2132  . feeding supplement (ENSURE COMPLETE) (ENSURE COMPLETE) liquid 237 mL  237 mL Oral TID BM Jeoffrey MassedLaura Lee Jobe, RD   237 mL at 05/03/14 1523  . hydrOXYzine (ATARAX/VISTARIL) tablet 25 mg  25 mg Oral Q6H PRN  Kristeen Mans, NP   25 mg at 05/02/14 1403  . loperamide (IMODIUM) capsule 2-4 mg  2-4 mg Oral PRN Kristeen Mans, NP   4 mg at 04/30/14 1208  . LORazepam (ATIVAN) tablet 1 mg  1 mg Oral Q6H PRN Rachael Fee, MD   1 mg at 05/02/14 1949  . magnesium hydroxide (MILK OF MAGNESIA) suspension 30 mL  30 mL Oral Daily PRN Kristeen Mans, NP      . methocarbamol (ROBAXIN) tablet 500 mg  500 mg Oral Q8H PRN Kristeen Mans, NP   500 mg at 04/30/14 0831  . naproxen (NAPROSYN) tablet 500 mg  500 mg Oral BID PRN Kristeen Mans, NP   500 mg at 05/03/14 0831  . nicotine (NICODERM CQ - dosed in mg/24 hours) patch 21 mg  21 mg Transdermal Q0600 Rachael Fee, MD   21 mg at 05/03/14 0827  . ondansetron (ZOFRAN-ODT) disintegrating tablet 4 mg  4 mg Oral Q6H PRN Kristeen Mans, NP   4 mg at 05/02/14 1710  . QUEtiapine (SEROQUEL) tablet 100 mg  100 mg Oral QHS Rachael Fee, MD   100 mg at 05/02/14 2136  . QUEtiapine (SEROQUEL) tablet 50 mg  50 mg Oral BID Rachael Fee, MD   50 mg at 05/03/14 0825  . traZODone (DESYREL) tablet 100 mg  100 mg Oral QHS PRN Rachael Fee, MD   100 mg at 05/02/14 2136    Lab Results: No results found for this or any previous visit (from the past 48 hour(s)).  Physical Findings: AIMS: Facial and Oral Movements Muscles of Facial Expression: None, normal Lips and Perioral Area: None, normal Jaw: None, normal Tongue: None, normal,Extremity Movements Upper (arms, wrists, hands, fingers): None, normal Lower (legs, knees, ankles, toes): None, normal, Trunk Movements Neck, shoulders, hips: None, normal, Overall Severity Severity of abnormal movements (highest score from questions above): None, normal Incapacitation due to abnormal movements: None, normal Patient's awareness of abnormal movements (rate only patient's report): No Awareness, Dental Status Current problems with teeth and/or dentures?: No Does patient usually wear dentures?: No  CIWA:    COWS:  COWS Total Score: 4  Treatment Plan Summary: Daily contact with patient to assess and evaluate symptoms and progress in treatment Medication management  Plan: Supportive approach/coping skills/relapse prevention           Wean off the Subutex           Optimine response to the psychotropics             Medical Decision Making Problem Points:  Review of psycho-social stressors (1) Data Points:  Review of medication regiment & side effects (2) Review of new medications or change in dosage (2)  I certify that inpatient services  furnished can reasonably be expected to improve the patient's condition.   Maeli Spacek A 05/03/2014, 3:57 PM

## 2014-05-03 NOTE — BHH Group Notes (Signed)
0900 nursing orientation group   The focus of this group is to educate the patient on the purpose and policies of crisis stabilization and provide a format to answer questions about their admission.  The group details unit policies and expectations of patients while admitted.  Pt did not attend he remained in his bed awake and refused to come

## 2014-05-03 NOTE — B[facility] Group Notes (Signed)
BHH LCSW Group Therapy  05/03/2014 2:38 PM  Type of Therapy:  Group Therapy  Participation Level:  Did Not Attend-pt in room sleeping.   Smart, Terresa Marlett LCSWA  05/03/2014, 2:38 PM

## 2014-05-03 NOTE — Progress Notes (Signed)
Patient ID: Cole Morrison, male   DOB: 03/08/1986, 28 y.o.   MRN: [id number] Self inventory from this AM: Depression and hopelessness at 5, anxiety at 6. Withdrawals of chilling, cramping , agitation,and he denies SI thoughts. Physical problem headache, His goal is to be drug free and not to do drugs is how he is meeting his goal.

## 2014-05-03 NOTE — Progress Notes (Signed)
Patient ID: Cole Morrison, male   DOB: 1986/03/29, 28 y.o.   MRN: [id number]  D: When asked about his day pt informed the writer that the people living in his house are now gone. Stated, now he can "sit down with his son and do homework." Stated he can positive. However, pt then stated that his "future is not bright" and when referring to ensure stated, "he may OD on vitamins since he loves it so much". Writer challenged pt to change his terminology, and informed it might affect his mood. Pt asked if the would be sent home with his meds since he's on a subutex taper.  A:  Support and encouragement was offered. 15 min checks continued for safety.  R: Pt remains safe.

## 2014-05-04 DIAGNOSIS — F1010 Alcohol abuse, uncomplicated: Secondary | ICD-10-CM

## 2014-05-04 DIAGNOSIS — F319 Bipolar disorder, unspecified: Secondary | ICD-10-CM

## 2014-05-04 DIAGNOSIS — F1120 Opioid dependence, uncomplicated: Principal | ICD-10-CM

## 2014-05-04 MED ORDER — HYDROXYZINE HCL 25 MG PO TABS
25.0000 mg | ORAL_TABLET | Freq: Three times a day (TID) | ORAL | Status: DC | PRN
  Filled 2014-05-04: qty 42

## 2014-05-04 MED ORDER — TRAZODONE HCL 100 MG PO TABS: 100.0000 mg | ORAL_TABLET | Freq: Every evening | ORAL | Status: DC | PRN

## 2014-05-04 MED ORDER — GABAPENTIN 300 MG PO CAPS: 300.0000 mg | ORAL_CAPSULE | Freq: Three times a day (TID) | ORAL | Status: DC

## 2014-05-04 MED ORDER — HYDROXYZINE HCL 25 MG PO TABS: ORAL_TABLET | ORAL | Status: DC

## 2014-05-04 MED ORDER — GABAPENTIN 300 MG PO CAPS
300.0000 mg | ORAL_CAPSULE | Freq: Three times a day (TID) | ORAL | Status: DC
  Administered 2014-05-04: 300 mg via ORAL
  Filled 2014-05-04 (×6): qty 1

## 2014-05-04 MED ORDER — QUETIAPINE FUMARATE 50 MG PO TABS: ORAL_TABLET | ORAL | Status: DC

## 2014-05-04 NOTE — Progress Notes (Signed)
[facility] :  Will you be returning to the same living situation after discharge: Yes,  home with girlfriend At discharge, do you have transportation home?:Yes,  girlfriend coming at 1:30/2:00PM to pick up pt Do you have the ability to pay for your medications:Yes,  Mental health  Release of information consent forms completed and submitted to medical records by CSW.  Patient to Follow up at: Follow-up Information   Follow up with [facility]. (Walk in between 8am-9am Monday through Friday for hospital follow-up/medication management/assessment for therapy services. )    Contact information:   [address]  [place], [place] [address] Phone: [phone] Fax: [phone]    Pt refused to sign PSI release-he is still connected to [facility] per Traci/girlfriend but he stated that [facility]   Patient denies SI/HI:   Yes,  during group/self report.     Safety Planning and Suicide Prevention discussed:  Yes,  SPE completed with pt's girlfriend, Traci. SPI pamphlet provided to pt and he was encouraged to share information with support network, ask questions, and talk about any concerns relating to SPE.  Smart, Kathline Banbury LCSWA  05/04/2014, 10:16 AM

## 2014-05-04 NOTE — Plan of Care (Signed)
Problem: Ineffective individual coping Goal: LTG: Patient will report a decrease in negative feelings Outcome: Completed/Met Date Met:  05/04/14 Pt denies si and hi Goal: LTG-Other (Specify)- Outcome: Not Progressing Pt minimizing substance abuse Goal: STG: Patient will remain free from self harm Outcome: Completed/Met Date Met:  05/04/14 Pt denies si and hi  Problem: Alteration in mood Goal: STG-Patient is able to discuss feelings and issues (Patient is able to discuss feelings and issues leading to depression)  Outcome: Adequate for Discharge Pt is superficial and minimizing substance abuse Goal: STG-Patient reports thoughts of self-harm to staff Outcome: Completed/Met Date Met:  05/04/14 Pt denies si and hi  Problem: Consults Goal: Substance Abuse Patient Education See Patient Education Module for education specifics.  Outcome: Not Progressing Pt did not attend all meetings on the unit

## 2014-05-04 NOTE — Tx Team (Signed)
Interdisciplinary Treatment Plan Update (Adult)  Date: 05/04/2014   Time Reviewed: 10:20 AM  Progress in Treatment:  Attending groups: Intermittently  Participating in groups:  Yes, when he attends  Taking medication as prescribed: Yes  Tolerating medication: Yes  Family/Significant othe contact made: SPE completed with pt's girlfriend, Traci.  Patient understands diagnosis: Yes, AEB seeking treatment for heroin detox, mood stabilization, med management, and passive SI.  Discussing patient identified problems/goals with staff: Yes  Medical problems stabilized or resolved: Yes  Denies suicidal/homicidal ideation: Yes during self report.  Patient has not harmed self or Others: Yes  New problem(s) identified:  Discharge Plan or Barriers: Pt plans to return home and [facility]. Pt provided with NA list and Mental Health Association info.  Additional comments: n/a  Reason for Continuation of Hospitalization: d/c today  Estimated length of stay: x For review of initial/current patient goals, please see plan of care.  Attendees:  Patient:    Family:    Physician: Geoffery LyonsIrving Lugo MD 05/04/2014. 10:20 AM   Nursing: Jan RN  05/04/2014 10:20 AM   Clinical Social Worker Leory Allinson Smart, LCSWA  05/04/2014 10:20 AM   Other: Lowanda FosterBrittany RN 05/04/2014 10:20 AM   Other: Marylu LundJanet, RN 05/04/2014 10:20 AM   Other: Darden DatesJennifer C. Nurse CM 05/04/2014 10:20 AM   Other:    Scribe for Treatment Team:  The Sherwin-WilliamsHeather Smart LCSWA 05/04/2014 10:20 AM

## 2014-05-04 NOTE — B[facility] Suicide Risk Assessment (Signed)
B[facility] INPATIENT:  Family/Significant Other Suicide Prevention Education  Suicide Prevention Education:  Education Completed; Cole Morrison (pt's girlfriend) [phone] has been identified by the patient as the family member/significant other with whom the patient will be residing, and identified as the person(s) who will aid the patient in the event of a mental health crisis (suicidal ideations/suicide attempt).  With written consent from the patient, the family member/significant other has been provided the following suicide prevention education, prior to the and/or following the discharge of the patient.  The suicide prevention education provided includes the following:  Suicide risk factors  Suicide prevention and interventions  National Suicide Hotline telephone number  [facility] assessment telephone number  [facility] Emergency Assistance 911  [facility] and/or Residential Mobile Crisis Unit telephone number  Request made of family/significant other to:  Remove weapons (e.g., guns, rifles, knives), all items previously/currently identified as safety concern.    Remove drugs/medications (over-the-counter, prescriptions, illicit drugs), all items previously/currently identified as a safety concern.  The family member/significant other verbalizes understanding of the suicide prevention education information provided.  The family member/significant other agrees to remove the items of safety concern listed above.  Smart, Willona Phariss LCSWA  05/04/2014, 10:10 AM

## 2014-05-04 NOTE — Progress Notes (Signed)
Pt stated he needed an ativan for his nerves and was given 1mg  at 11am. Pt wanted to know how to get off drugs since he has been on drugs since the age of 28. His GF will pick him up. He denies SI and HI and contracts for safety.

## 2014-05-04 NOTE — [facility] Group Notes (Signed)
[facility]   05/04/2014 9:40 AM  Participation Quality:  DID NOT ATTEND-in room resting.   Smart, American FinancialHeather LCSWA

## 2014-05-04 NOTE — Discharge Summary (Signed)
Physician Discharge Summary Note  Patient:  Cole CompWestley Morrison is an 28 y.o., male MRN:  [id number] DOB:  Jan 23, 1986 Patient phone:  [phone] (home)  Patient address:   [address] [place] [address],  Total Time spent with patient: Greater than 30 mintes  Date of Admission:  04/29/2014 Date of Discharge: 05/03/14  Reason for Admission: Opioid detox  Discharge Diagnoses: Active Problems:   Severe opioid use disorder  Psychiatric Specialty Exam: Physical Exam  Psychiatric: His speech is normal and behavior is normal. Judgment and thought content normal. His mood appears not anxious. His affect is not angry, not blunt, not labile and not inappropriate. Cognition and memory are normal. He does not exhibit a depressed mood.    Review of Systems  Constitutional: Negative.   HENT: Negative.   Eyes: Negative.   Respiratory: Negative.   Cardiovascular: Negative.   Gastrointestinal: Negative.   Genitourinary: Negative.   Musculoskeletal: Negative.   Skin: Negative.   Neurological: Negative.   Endo/Heme/Allergies: Negative.   Psychiatric/Behavioral: Positive for substance abuse (Opioid dependence). Negative for depression, suicidal ideas, hallucinations and memory loss. The patient has insomnia (Stable). The patient is not nervous/anxious.     Blood pressure 119/65, pulse 75, temperature 98 F (36.7 C), temperature source Oral, resp. rate 18, height 5\' 7"  (1.702 m), weight 54.885 kg (121 lb), SpO2 98.00%.Body mass index is 18.95 kg/(m^2).   Past Psychiatric History: Diagnosis: Opioid dependence, Substance induced mood disorder  [facility]  Outpatient Care: [facility]  Substance Abuse Care: [facility]  Self-Mutilation: NA  Suicidal Attempts: NA  Violent Behaviors: NA   Musculoskeletal: Strength & Muscle Tone: within normal limits Gait & Station: normal Patient leans: N/A  DSM5: Schizophrenia Disorders:  NA Obsessive-Compulsive Disorders:   NA Trauma-Stressor Disorders:  NA Substance/Addictive Disorders:  Opioid Disorder - Severe (304.00) Depressive Disorders:  Substance induced mood disorder  Axis Diagnosis:   AXIS I:  Opioid dependence, Substance induced mood disorder AXIS II:  Deferred AXIS III:   Past Medical History  Diagnosis Date  . Arthritis   . Diabetes mellitus without complication Borderline  . Overactive bladder   . PTSD (post-traumatic stress disorder)   . Bipolar 1 disorder   . Acid reflux   . 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 Gastritis    AXIS IV:  other psychosocial or environmental problems and Substance dependence AXIS V:  62  Level of Care:  OP  Hospital Course:   [name] who states that after he was here and was D/C in April he did fine for a while. He relapsed few months ago. Using heroin IV every day 2-3 bags a day. States things got really bad, he was wanting to kill himself. He was given an Chief Operating Officerultimatum by his girlfriend.   Cole Morrison was admitted to the hospital with his UDS reports positive for opioid. He needed detox treatment to re-stabilize his systems of opioid intoxication. This was achieved using brief clonidine detox protocols and Subutex regimen. He also enrolled in the group counseling sessions and AA/NA meetings being offered and held on this unit. He learned coping skills.   Besides the detox treatment, Cole Morrison was medicated and discharged on Neurontin 300 mg three times daily for substance withdrawal syndrome, hydroxyzine 25 mg three times daily as needed for anxiety, Seroquel 50 mg bid and 100 mg Q bedtime for agitation/mood control and Trazodone 50 mg Q bedtime for sleep. He tolerated his treatment regimen without any  significant adverse effects and or reactions.   Cole Spore has completed detox treatment and his mood is able. He is currently being discharged to continue treatment at the [facility] here in [place], [place]. He is provided with all the necessary information needed to  make this appointment without problems. Upon discharge, Cole Spore adamantly denies any SIHI, AVH, delusional thoughts, paranoia and or withdrawal symptoms. He received from the [facility] pharmacy, a 14 days worth supply samples of his discharge medications. He left [facility] with all belongings in no apparent distress. Transportation girlfriend.  Consults:  psychiatry  Significant Diagnostic Studies:  labs: CBC with diff, CMP, UDS, toxicology tests, U/A  Discharge Vitals:   Blood pressure 119/65, pulse 75, temperature 98 F (36.7 C), temperature source Oral, resp. rate 18, height 5\' 7"  (1.702 m), weight 54.885 kg (121 lb), SpO2 98.00%. Body mass index is 18.95 kg/(m^2). Lab Results:   No results found for this or any previous visit (from the past 72 hour(s)).  Physical Findings: AIMS: Facial and Oral Movements Muscles of Facial Expression: None, normal Lips and Perioral Area: None, normal Jaw: None, normal Tongue: None, normal,Extremity Movements Upper (arms, wrists, hands, fingers): None, normal Lower (legs, knees, ankles, toes): None, normal, Trunk Movements Neck, shoulders, hips: None, normal, Overall Severity Severity of abnormal movements (highest score from questions above): None, normal Incapacitation due to abnormal movements: None, normal Patient's awareness of abnormal movements (rate only patient's report): No Awareness, Dental Status Current problems with teeth and/or dentures?: No Does patient usually wear dentures?: No  CIWA:    COWS:  COWS Total Score: 1  Psychiatric Specialty Exam: See Psychiatric Specialty Exam and Suicide Risk Assessment completed by Attending Physician prior to discharge.  Discharge destination:  Home  Is patient on multiple antipsychotic therapies at discharge:  No   Has Patient had three or more failed trials of antipsychotic monotherapy by history:  No  Recommended Plan for Multiple Antipsychotic Therapies: NA    Medication List       Indication    gabapentin 300 MG capsule  Commonly known as:  NEURONTIN  Take 1 capsule (300 mg total) by mouth 3 (three) times daily. For substance withdrawal syndrome   Indication:  For substance withdrawal syndrome     hydrOXYzine 25 MG tablet  Commonly known as:  ATARAX/VISTARIL  Take 1 tablet (25 mg) three times daily as needed: For anxiety   Indication:  Tension, Anxiety     QUEtiapine 50 MG tablet  Commonly known as:  SEROQUEL  Take 1 tablet (50 mg) twice daily & 2 tablets (100 mg) at bedtime: For agitation/mood control   Indication:  Mood control/agitation     traZODone 100 MG tablet  Commonly known as:  DESYREL  Take 1 tablet (100 mg total) by mouth at bedtime as needed for sleep.   Indication:  Trouble Sleeping       Follow-up Information   Follow up with [facility]. (Walk in between 8am-9am Monday through Friday for hospital follow-up/medication management/assessment for therapy services. )    Contact information:   [address] [place] [address] Phone: [phone] Fax: [phone]     Follow-up recommendations:  Activity:  As tolerated Diet: As recommended by your primary care doctor. Keep all scheduled follow-up appointments as recommended.  Comments:  Take all your medications as prescribed by your mental healthcare provider. Report any adverse effects and or reactions from your medicines to your outpatient provider promptly. Patient is instructed and cautioned to not engage  in alcohol and or illegal drug use while on prescription medicines. In the event of worsening symptoms, patient is instructed to call the crisis hotline, 911 and or go to the nearest ED for appropriate evaluation and treatment of symptoms. Follow-up with your primary care provider for your other medical issues, concerns and or health care needs.   Total Discharge Time:  Greater than 30 minutes.  Signed: Sanjuana Kava, PMHNP 05/04/2014, 12:10 PM I personally assessed the patient and formulated  the plan Madie Reno A. Dub Mikes, M.D.

## 2014-05-04 NOTE — Progress Notes (Signed)
Pt d/c from the hospital with his wife. All items returned. D/C instructions given. Pt requested additional copy of discharge information with code for obtaining medical records and reported that he needed it to apply for disability. When asked why he needed disability, he stated that he had multiple problems old head injury, diagnosed with a personality disorder etc. Pt then left the paper sitting on counter where Clinical research associatewriter gave it to him. Prescriptions given and samples given. Pt stated "I will see you next month" as he was going out the door. Pt denies si and hi.

## 2014-05-04 NOTE — [facility] Suicide Risk Assessment (Signed)
Suicide Risk Assessment  Discharge Assessment     Demographic Factors:  Male and Caucasian  Total Time spent with patient: 45 minutes  Psychiatric Specialty Exam:     Blood pressure 119/65, pulse 75, temperature 98 F (36.7 C), temperature source Oral, resp. rate 18, height 5\' 7"  (1.702 m), weight 54.885 kg (121 lb), SpO2 98.00%.Body mass index is 18.95 kg/(m^2).  General Appearance: Fairly Groomed  Patent attorneyye Contact::  Fair  Speech:  Clear and Coherent  Volume:  Normal  Mood:  Anxious  Affect:  Appropriate  Thought Process:  Coherent and Goal Directed  Orientation:  Full (Time, Place, and Person)  Thought Content:  plans as he moves on, relapse prevention plan  Suicidal Thoughts:  No  Homicidal Thoughts:  No  Memory:  Immediate;   Fair Recent;   Fair Remote;   Fair  Judgement:  Fair  Insight:  Present  Psychomotor Activity:  Restlessness  Concentration:  Fair  Recall:  Fis[employer]Fair  Fund of Knowledge:NA  Language: Fair  Akathisia:  No  Handed:    AIMS (if indicated):     Assets:  Desire for Improvement Housing Vocational/Educational  Sleep:  Number of Hours: 5    Musculoskeletal: Strength & Muscle Tone: within normal limits Gait & Station: normal Patient leans: N/A   Mental Status Per Nursing Assessment::   On Admission:  Suicidal ideation indicated by patient  Current Mental Status by Physician: In full contact with reality. There are no active S/S of withdrawal. He states he wants to follow up closely with a provider and address his mood changes. States he is committed to abstinence.    Loss Factors: NA  Historical Factors: NA  Risk Reduction Factors:   Employed, Living with another person, especially a relative and Positive social support  Continued Clinical Symptoms:  Bipolar Disorder:   Mixed State Alcohol/Substance Abuse/Dependencies  Cognitive Features That Contribute To Risk:  Closed-mindedness Polarized thinking Thought constriction (tunnel vision)     Suicide Risk:  Minimal: No identifiable suicidal ideation.  Patients presenting with no risk factors but with morbid ruminations; may be classified as minimal risk based on the severity of the depressive symptoms  Discharge Diagnoses:   AXIS I:  Opioid Dependence, Alcohol Abuse, Bipolar Disorder  AXIS II:  No diagnosis AXIS III:   Past Medical History  Diagnosis Date  . Arthritis   . Diabetes mellitus without complication Borderline  . Overactive bladder   . PTSD (post-traumatic stress disorder)   . Bipolar 1 disorder   . Acid reflux   . 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 Gastritis    AXIS IV:  other psychosocial or environmental problems AXIS V:  61-70 mild symptoms  Plan Of Care/Follow-up recommendations:  Activity:  as tolerated Diet:  regular Follow up outpatient basis Is patient on multiple antipsychotic therapies at discharge:  No   Has Patient had three or more failed trials of antipsychotic monotherapy by history:  No  Recommended Plan for Multiple Antipsychotic Therapies: NA    Cole Morrison A 05/04/2014, 2:03 PM

## 2014-05-09 NOTE — Progress Notes (Signed)
Patient Discharge Instructions:  After Visit Summary (AVS):   Faxed to:  05/09/14 Discharge Summary Note:   Faxed to:  05/09/14 Psychiatric Admission Assessment Note:   Faxed to:  05/09/14 Suicide Risk Assessment - Discharge Assessment:   Faxed to:  05/09/14 Faxed/Sent to the Next Level Care provider:  05/09/14 [facility] to [facility] @ [phone]  Jerelene ReddenSheena E West Buechel, 05/09/2014, 3:00 PM

## 2014-09-02 ENCOUNTER — Emergency Department (HOSPITAL_COMMUNITY)
Admission: EM | Admit: 2014-09-02 | Discharge: 2014-09-02 | Disposition: A | Discharge status: Home / Self Care | Payer: Self-pay | Attending: Emergency Medicine | Admitting: Emergency Medicine

## 2014-09-02 ENCOUNTER — Encounter (HOSPITAL_COMMUNITY): Payer: Self-pay | Admitting: Emergency Medicine

## 2014-09-02 DIAGNOSIS — E119 Type 2 diabetes mellitus without complications: Secondary | ICD-10-CM | POA: Insufficient documentation

## 2014-09-02 DIAGNOSIS — F319 Bipolar disorder, unspecified: Secondary | ICD-10-CM | POA: Insufficient documentation

## 2014-09-02 DIAGNOSIS — Z79899 Other long term (current) drug therapy: Secondary | ICD-10-CM | POA: Insufficient documentation

## 2014-09-02 DIAGNOSIS — J069 Acute upper respiratory infection, unspecified: Secondary | ICD-10-CM | POA: Insufficient documentation

## 2014-09-02 DIAGNOSIS — Z8739 Personal history of other diseases of the musculoskeletal system and connective tissue: Secondary | ICD-10-CM | POA: Insufficient documentation

## 2014-09-02 DIAGNOSIS — F4310 Post-traumatic stress disorder, unspecified: Secondary | ICD-10-CM | POA: Insufficient documentation

## 2014-09-02 DIAGNOSIS — Z720 Tobacco use: Secondary | ICD-10-CM | POA: Insufficient documentation

## 2014-09-02 DIAGNOSIS — Z87448 Personal history of other diseases of urinary system: Secondary | ICD-10-CM | POA: Insufficient documentation

## 2014-09-02 DIAGNOSIS — Z8719 Personal history of other diseases of the digestive system: Secondary | ICD-10-CM | POA: Insufficient documentation

## 2014-09-02 DIAGNOSIS — Z8619 Personal history of other infectious and parasitic diseases: Secondary | ICD-10-CM | POA: Insufficient documentation

## 2014-09-02 DIAGNOSIS — B9789 Other viral agents as the cause of diseases classified elsewhere: Secondary | ICD-10-CM

## 2014-09-02 MED ORDER — ALBUTEROL SULFATE HFA 108 (90 BASE) MCG/ACT IN AERS: 1.0000 | INHALATION_SPRAY | Freq: Four times a day (QID) | RESPIRATORY_TRACT | Status: DC | PRN

## 2014-09-02 MED ORDER — GUAIFENESIN 100 MG/5ML PO LIQD: 100.0000 mg | ORAL | Status: DC | PRN

## 2014-09-02 MED ORDER — IBUPROFEN 800 MG PO TABS: 800.0000 mg | ORAL_TABLET | Freq: Three times a day (TID) | ORAL | Status: DC

## 2014-09-02 MED ORDER — KETOROLAC TROMETHAMINE 30 MG/ML IJ SOLN
30.0000 mg | Freq: Once | INTRAMUSCULAR | Status: AC
  Administered 2014-09-02: 30 mg via INTRAMUSCULAR
  Filled 2014-09-02: qty 1

## 2014-09-02 NOTE — ED Provider Notes (Signed)
CSN: [id number]     Arrival date & time 09/02/14  1628 History  This chart was scribed for Terri Piedraourtney Forcucci, PA-C, working with Audree CamelScott T Goldston, MD by Chestine SporeSoijett Blue, ED Scribe. The patient was seen in room WTR5/WTR5 at 5:22 PM.    Chief Complaint  Patient presents with  . Cough    x 1 day  . Generalized Body Aches     The history is provided by the patient. No language interpreter was used.   HPI Comments: Cole Morrison is a 28 y.o. male who presents to the Emergency Department complaining of dry hacking "burning sensation" cough onset 1 day. He states that he thinks that he maybe catching pneumonia or bronchitis. He states that he is having associated symptoms of HA, body aches, rhinorrhea, congestion, sore throat, chest burning, wheezing. He states that he normally will wheeze when he gets sick. He states that he has tried Motrin, cough drops, and menthol drops with no relief for his symptoms. He denies fever, CP, SOB, and any other symptoms. He states that he has been around sick contacts. He denies being allergic to any medications. He states that he is a smoker and he smokes less than 0.5 PPD. He states that he has not smoked since being sick.   Past Medical History  Diagnosis Date  . Arthritis   . Diabetes mellitus without complication Borderline  . Overactive bladder   . PTSD (post-traumatic stress disorder)   . Bipolar 1 disorder   . Acid reflux   . 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 Gastritis    Past Surgical History  Procedure Laterality Date  . Wisdom tooth extraction     Family History  Problem Relation Age of Onset  . Hypertension Mother   . Hypertension Father    History  Substance Use Topics  . Smoking status: Current Every Day Smoker -- 0.50 packs/day    Types: Cigarettes  . Smokeless tobacco: Never Used  . Alcohol Use: No    Review of Systems  Constitutional: Negative for fever and chills.  HENT: Positive for congestion, rhinorrhea and sore throat.    Respiratory: Positive for cough and wheezing. Negative for shortness of breath.   Cardiovascular: Negative for chest pain.  Neurological: Positive for headaches.  All other systems reviewed and are negative.   Allergies  Review of patient's allergies indicates no known allergies.  Home Medications   Prior to Admission medications   Medication Sig Start Date End Date Taking? Authorizing Provider  QUEtiapine (SEROQUEL) 100 MG tablet Take 200 mg by mouth at bedtime.   Yes Historical Provider, MD  albuterol (PROVENTIL HFA;VENTOLIN HFA) 108 (90 BASE) MCG/ACT inhaler Inhale 1-2 puffs into the lungs every 6 (six) hours as needed for wheezing or shortness of breath. 09/02/14   Anaisa Radi A Forcucci, PA-C  gabapentin (NEURONTIN) 300 MG capsule Take 1 capsule (300 mg total) by mouth 3 (three) times daily. For substance withdrawal syndrome 05/04/14   Sanjuana KavaAgnes I Nwoko, NP  guaiFENesin (ROBITUSSIN) 100 MG/5ML liquid Take 5-10 mLs (100-200 mg total) by mouth every 4 (four) hours as needed for cough. 09/02/14   Caliope Ruppert A Forcucci, PA-C  hydrOXYzine (ATARAX/VISTARIL) 25 MG tablet Take 1 tablet (25 mg) three times daily as needed: For anxiety 05/04/14   Sanjuana KavaAgnes I Nwoko, NP  ibuprofen (ADVIL,MOTRIN) 800 MG tablet Take 1 tablet (800 mg total) by mouth 3 (three) times daily. 09/02/14   Adamae Ricklefs A Forcucci, PA-C  QUEtiapine (SEROQUEL) 50  MG tablet Take 1 tablet (50 mg) twice daily & 2 tablets (100 mg) at bedtime: For agitation/mood control 05/04/14   Sanjuana KavaAgnes I Nwoko, NP  traZODone (DESYREL) 100 MG tablet Take 1 tablet (100 mg total) by mouth at bedtime as needed for sleep. 05/04/14   Sanjuana KavaAgnes I Nwoko, NP   BP 132/81 mmHg  Pulse 99  Temp(Src) 98.2 F (36.8 C) (Oral)  Resp 20  Wt 140 lb (63.504 kg)  SpO2 100%  Physical Exam  Constitutional: He is oriented to person, place, and time. He appears well-developed and well-nourished. No distress.  HENT:  Head: Normocephalic and atraumatic.  Right Ear: Tympanic membrane  normal.  Left Ear: Tympanic membrane normal.  Nose: Mucosal edema present.  Mouth/Throat: Uvula is midline. Posterior oropharyngeal erythema present.  Eyes: EOM are normal. Pupils are equal, round, and reactive to light.  Neck: Neck supple. No tracheal deviation present. No Brudzinski's sign and no Kernig's sign noted.  Cardiovascular: Normal rate, regular rhythm and normal heart sounds.  Exam reveals no gallop and no friction rub.   No murmur heard. Pulmonary/Chest: Effort normal and breath sounds normal. No respiratory distress. He has no wheezes. He has no rales.  No rhonchi  Musculoskeletal: Normal range of motion.  Lymphadenopathy:    He has no cervical adenopathy.  Neurological: He is alert and oriented to person, place, and time.  Skin: Skin is warm and dry.  Psychiatric: He has a normal mood and affect. His behavior is normal.  Nursing note and vitals reviewed.   ED Course  Procedures (including critical care time) DIAGNOSTIC STUDIES: Oxygen Saturation is 100% on room air, normal by my interpretation.    COORDINATION OF CARE: 5:30 PM-Discussed treatment plan which includes albuterol inhaler, cough medication, Tylenol and Ibuprofen PRN, nasal saline, injection of toradol with pt at bedside and pt agreed to plan.   Labs Review Labs Reviewed - No data to display  Imaging Review No results found.   EKG Interpretation None      MDM   Final diagnoses:  Viral URI with cough  Tobacco abuse   Patient is a 28 y.o. Male who presents to the ED with cough, congestion, and wheezing.  Patient has normal VS.  Physical exam reveals clear lungs to auscultation.  There are no meningeal signs at this time.  Given 1 day of symptoms doubt PNA.  Will treat symptomatically with albuterol, cough medication, tylenol, ibuprofen, and nasal saline.  Patient given IM injection of toradol here.  Patient to be discharged home at this time.  [facility]  Patient to return for worsening chest pain, shortness of breath, or any other concerning factors.  Patient states understanding and agreement.    I personally performed the services described in this documentation, which was scribed in my presence. The recorded information has been reviewed and is accurate.    Eben Burowourtney A Forcucci, PA-C 09/02/14 1741  Audree CamelScott T Goldston, MD 09/05/14 1537

## 2014-09-02 NOTE — ED Notes (Signed)
Pt reports cough, sinus drainage, headache and chest wall pain with cough. Symptoms started yesterday. Tx with Motrin Slight inspiratory wheeze noted

## 2014-09-02 NOTE — Discharge Instructions (Signed)
Upper Respiratory Infection, Adult °An upper respiratory infection (URI) is also sometimes known as the common cold. The upper respiratory tract includes the nose, sinuses, throat, trachea, and bronchi. Bronchi are the airways leading to the lungs. Most people improve within 1 week, but symptoms can last up to 2 weeks. A residual cough may last even longer.  °CAUSES °Many different viruses can infect the tissues lining the upper respiratory tract. The tissues become irritated and inflamed and often become very moist. Mucus production is also common. A cold is contagious. You can easily spread the virus to others by oral contact. This includes kissing, sharing a glass, coughing, or sneezing. Touching your mouth or nose and then touching a surface, which is then touched by another person, can also spread the virus. °SYMPTOMS  °Symptoms typically develop 1 to 3 days after you come in contact with a cold virus. Symptoms vary from person to person. They may include: °· Runny nose. °· Sneezing. °· Nasal congestion. °· Sinus irritation. °· Sore throat. °· Loss of voice (laryngitis). °· Cough. °· Fatigue. °· Muscle aches. °· Loss of appetite. °· Headache. °· Low-grade fever. °DIAGNOSIS  °You might diagnose your own cold based on familiar symptoms, since most people get a cold 2 to 3 times a year. Your caregiver can confirm this based on your exam. Most importantly, your caregiver can check that your symptoms are not due to another disease such as strep throat, sinusitis, pneumonia, asthma, or epiglottitis. Blood tests, throat tests, and X-rays are not necessary to diagnose a common cold, but they may sometimes be helpful in excluding other more serious diseases. Your caregiver will decide if any further tests are required. °RISKS AND COMPLICATIONS  °You may be at risk for a more severe case of the common cold if you smoke cigarettes, have chronic heart disease (such as heart failure) or lung disease (such as asthma), or if  you have a weakened immune system. The very young and very old are also at risk for more serious infections. Bacterial sinusitis, middle ear infections, and bacterial pneumonia can complicate the common cold. The common cold can worsen asthma and chronic obstructive pulmonary disease (COPD). Sometimes, these complications can require emergency medical care and may be life-threatening. °PREVENTION  °The best way to protect against getting a cold is to practice good hygiene. Avoid oral or hand contact with people with cold symptoms. Wash your hands often if contact occurs. There is no clear evidence that vitamin C, vitamin E, echinacea, or exercise reduces the chance of developing a cold. However, it is always recommended to get plenty of rest and practice good nutrition. °TREATMENT  °Treatment is directed at relieving symptoms. There is no cure. Antibiotics are not effective, because the infection is caused by a virus, not by bacteria. Treatment may include: °· Increased fluid intake. Sports drinks offer valuable electrolytes, sugars, and fluids. °· Breathing heated mist or steam (vaporizer or shower). °· Eating chicken soup or other clear broths, and maintaining good nutrition. °· Getting plenty of rest. °· Using gargles or lozenges for comfort. °· Controlling fevers with ibuprofen or acetaminophen as directed by your caregiver. °· Increasing usage of your inhaler if you have asthma. °Zinc gel and zinc lozenges, taken in the first 24 hours of the common cold, can shorten the duration and lessen the severity of symptoms. Pain medicines may help with fever, muscle aches, and throat pain. A variety of non-prescription medicines are available to treat congestion and runny nose. Your caregiver   can make recommendations and may suggest nasal or lung inhalers for other symptoms.  °HOME CARE INSTRUCTIONS  °· Only take over-the-counter or prescription medicines for pain, discomfort, or fever as directed by your  caregiver. °· Use a warm mist humidifier or inhale steam from a shower to increase air moisture. This may keep secretions moist and make it easier to breathe. °· Drink enough water and fluids to keep your urine clear or pale yellow. °· Rest as needed. °· Return to work when your temperature has returned to normal or as your caregiver advises. You may need to stay home longer to avoid infecting others. You can also use a face mask and careful hand washing to prevent spread of the virus. °SEEK MEDICAL CARE IF:  °· After the first few days, you feel you are getting worse rather than better. °· You need your caregiver's advice about medicines to control symptoms. °· You develop chills, worsening shortness of breath, or brown or red sputum. These may be signs of pneumonia. °· You develop yellow or brown nasal discharge or pain in the face, especially when you bend forward. These may be signs of sinusitis. °· You develop a fever, swollen neck glands, pain with swallowing, or white areas in the back of your throat. These may be signs of strep throat. °SEEK IMMEDIATE MEDICAL CARE IF:  °· You have a fever. °· You develop severe or persistent headache, ear pain, sinus pain, or chest pain. °· You develop wheezing, a prolonged cough, cough up blood, or have a change in your usual mucus (if you have chronic lung disease). °· You develop sore muscles or a stiff neck. °Document Released: 03/31/2001 Document Revised: 12/28/2011 Document Reviewed: 01/10/2014 °ExitCare® Patient Information ©2015 [employer]. This information is not intended to replace advice given to you by your health care provider. Make sure you discuss any questions you have with your health care provider. ° °Emergency Department Resource Guide °1) Find a Doctor and Pay Out of Pocket °Although you won't have to find out who is covered by your insurance plan, it is a good idea to ask around and get recommendations. You will then need to call the office and see if  the doctor you have chosen will accept you as a new patient and what types of options they offer for patients who are self-pay. Some doctors offer discounts or will set up payment plans for their patients who do not have insurance, but you will need to ask so you aren't surprised when you get to your appointment. ° °2) Contact Your Local Health Department °Not all health departments have doctors that can see patients for sick visits, but many do, so it is worth a call to see if yours does. If you don't know where your local health department is, you can check in your phone book. The CDC also has a tool to help you locate your state's health department, and many state websites also have listings of all of their local health departments. ° °3) Find a Walk-in Clinic °If your illness is not likely to be very severe or complicated, you may want to try a walk in clinic. These are popping up all over the country in pharmacies, drugstores, and shopping centers. They're usually staffed by nurse practitioners or physician assistants that have been trained to treat common illnesses and complaints. They're usually fairly quick and inexpensive. However, if you have serious medical issues or chronic medical problems, these are probably not your best option. ° °  No Primary Care Doctor: °- Call [facility] at  832-8000 - they can help you locate a primary care doctor that  accepts your insurance, provides certain services, etc. °- Physician Referral Service- 1-800-533-3463 ° °Chronic Pain Problems: °Organization         Address  Phone   Notes  °[facility]  (336) 297-2271 Patients need to be referred by their primary care doctor.  ° °Medication Assistance: °Organization         Address  Phone   Notes  °[place] [facility] [address] °[place], [place] [address] (336) 641-8030 --Must be a resident of [place] °-- Must have NO insurance coverage whatsoever (no  Medicaid/ Medicare, etc.) °-- The pt. MUST have a primary care doctor that directs their care regularly and follows them in the community °  °[facility]  (866) 331-1348   °[employer]  (888) 892-1162   ° °Agencies that provide inexpensive medical care: °Organization         Address  Phone   Notes  °[facility]  (336) 832-8035   °[facility]    (336) 832-7272   °[facility] [address] °[place], [place] [address] (336) 832-4777   °[facility] [address], °[place] (336) 271-4999   °[facility]    (336) 373-0678   °[facility]    (336) 272-1050   °[facility] ° [address], [place] Phone:  (336) 832-4444, Fax:  (336) 832-4440 Hours of Operation:  9 am - 6 pm, M-F.  Also accepts Medicaid/Medicare and self-pay.  °[facility] ° [address], [place] Phone: (336) 832-3150, Fax: (336) 832-3151. Hours of Operation:  8:30 am - 5:30 pm, M-F.  Also accepts Medicaid and self-pay.  °[facility] [place] [address], [place] Phone: (336) 878-6027   °[facility] [address], [place], [place] (336)723-1848, Ext. 123 Mondays & Thursdays: 7-9 AM.  First 15 patients are seen on a first come, first serve basis. °  ° °Medicaid-accepting [place] Providers: ° °Organization         Address  Phone   Notes  °[facility] [address], [place] (336) 641-2100 Also accepts self-pay patients.  °[facility] [address], [place] ° (336) 856-9996   °[facility] [address], [place] (336) 288-8857   °[facility] [address] [place] [address], [place] (336) 299-7000   °Veita Bland [address], [place]  ° (336) 373-1557 Only accepts  Access Medicaid patients after they have their name applied to their card.   ° °Self-Pay (no insurance) in [place]: ° °Organization         Address  Phone   Notes  °Sickle Cell Patients, [facility] [address], [place] (336) 832-1970   °[facility] [address], [place] (336) 832-4400   °[facility] ° [address], [place] (336) 992-4800   °[facility]/Dr. Osei-Bonsu ° [address] [place] [address], [place] or [address], [place] (336) 841-8500 Phone number for both [place] and [place] locations is the same.  °[facility] [address], [place] (336) 299-0000   °  [facility] [address] [place] [address], [place] or [address] (336) 852-7530 °(336) 878-2260   °[facility] [address], [place] (336) 350-1642, phone; (336) 294-5005, fax Sees patients 1st and 3rd Saturday of every month.  Must not qualify for public or private insurance (i.e. Medicaid, Medicare, [place] Health Choice, Veterans' Benefits) • Household income should be no more than 200% of the poverty level •The clinic cannot treat you if you are pregnant or think you are pregnant • Sexually transmitted diseases are not treated at the clinic.  ° ° °Dental Care: °Organization         Address  Phone  Notes  °[facility] [address], [place] (336) 641-6152 Accepts children up to age 21 who are enrolled in Medicaid or [place] Health Choice; pregnant women with a Medicaid card; and children who have applied for Medicaid or [place] Health Choice, but were declined, whose parents can pay a reduced fee at time of service.  °[facility] [place]  [address], [place] (336) 641-7733 Accepts children up to age 21 who are enrolled in Medicaid or [place] Health Choice; pregnant women with a Medicaid card; and children who have applied for Medicaid or [place] Health Choice,  but were declined, whose parents can pay a reduced fee at time of service.  °[facility] ° [address], [place] (336) 641-4533 Patients are seen by appointment only. Walk-ins are not accepted. [facility] will see patients 18 years of age and older. °Monday - Tuesday (8am-5pm) °Most Wednesdays (8:30-5pm) °$30 per visit, cash only  °[facility] ° [address], [place] (336) 641-4533 Patients are seen by appointment only. Walk-ins are not accepted. [facility] will see patients 18 years of age and older. °One Wednesday Evening (Monthly: Volunteer Based).  $30 per visit, cash only  °[facility]  (919) 537-3737 for adults; Children under age 4, call Graduate Pediatric Dentistry at (919) [phone]. Children aged 4-14, please call (919) [phone] to request a pediatric application. ° Dental services are provided in all areas of dental care including fillings, crowns and bridges, complete and partial dentures, implants, gum treatment, root canals, and extractions. Preventive care is also provided. Treatment is provided to both adults and children. °Patients are selected via a lottery and there is often a waiting list. °  °[facility] [address], ° ° (336) 763-8833 [email or web address] °  °[facility] [address], [place], [place] (336)723-1848, Ext. 123 Second and Fourth Thursday of each month, opens at 6:30 AM; Clinic ends at 9 AM.  Patients are seen on a first-come first-served basis, and a limited number are seen during each clinic.  ° °[facility] ° [address], [place], [place] (336) 723-7904   Eligibility Requirements °You must have lived in [place], [place], or [place] counties for at least the last three months. °  You cannot be eligible for state or federal sponsored healthcare insurance, including Veterans Administration, Medicaid, or Medicare. °  You generally  cannot be eligible for healthcare insurance through your employer.  °  How to apply: °Eligibility screenings are held every Tuesday and Wednesday afternoon from 1:00 pm until 4:00 pm. You do not need an appointment for the interview!  °[facility] [address], [place],  336-631-2330   °  [facility] [facility]  336-342-8273   °[facility]  336-703-3100   °[facility]  336-570-6415   ° °Behavioral Health Resources in the Community: °Intensive Outpatient Programs °Organization         Address  Phone  Notes  °[facility] [address], [place], [place] 336-878-6098   °[facility] [address], [place], [place] 336-832-9800   °[facility] [address], [place], [place] ° 336-882-2125   °[facility] [address],  °[place], [place] 1-800-853-5163 or 336-641-4981   °Substance Abuse Resources °Organization         Address  Phone  Notes  °[facility]  336-882-2125   °[facility]  336-784-9470   °[facility]  336-285-9073   °[facility]  336-845-3988   °Residential & Outpatient Substance Abuse Program  1-800-659-3381   °Psychological Services °Organization         Address  Phone  Notes  °[facility]  336- 832-9600   °[facility]  336- 378-7881   °[facility] [address], [place] 1-800-853-5163 or 336-641-4981   ° °Mobile Crisis Teams °Organization         Address  Phone  Notes  °[facility], Mobile Crisis Care Unit  1-877-626-1772   °Assertive °[facility] ° [address] [place], [place] 336-834-9664   °Sharon DeEsch [address] °[place] 336-554-5454   ° °Self-Help/Support Groups °Organization         Address  Phone             Notes  °[facility] - variety of support groups  336- 373-1402 Call for more  information  °Narcotics Anonymous (NA), [facility] [address], °[place]  2 meetings at this location  ° °Residential Treatment Programs °Organization         Address  Phone  Notes  °[facility] [address],    °[place]  1-866-801-8205   °[facility] ° [address], [place], [place] 704-293-8524   °[facility] [address], [place] 336-845-3988 Admissions: 8am-3pm M-F  °[facility] [address]    °[place], [place] 336-841-1104   °[facility] [address], [place], [place] 336-379-7146   °[facility] [address]  °[place], [place] 336-285-9073   °[facility] - Intensive Outpatient [address], [place], [place] 336-852-3033   °[facility] [address]  °[place], [place] 1-877-615-2722 or 336-784-9470   °[facility] [address] [place], [place] 336-227-7417 Accepts Medicaid  °[facility] [address]  ° [place] 1-800-659-3381 Substance Abuse/Addiction Treatment  ° °[place] Behavioral Health Resources °Organization         Address  Phone  Notes  °[facility]  (888) 581-9988   °Julie Brannon, PhD [address] [place], [place]   (336) 349-5553 or (336) 951-0000   °[facility]   [address] °[place],  (336) 349-4454   °[facility] [address], [place],  (336) 342-8316 Insurance/Medicaid/sponsorship through [facility]  °[facility] [address]                                      [place], [place] (336) 342-8316 Therapy/tele-psych/case  °[facility] [address]  ° [place], [place] (336) 349-2233    °Dr. Arfeen  (336) 349-4544   °[facility] [place]  [employer] [place] [facility] 1) [address], [place] °2) [address], [place] °3)  [address], [place] (336)  349-3220 °(336) 342-7768 ° °(336) 342-8140   °[place] [facility] (336) 342-1394 or (336) 342-3537 (After Hours)    ° ° °

## 2014-09-16 ENCOUNTER — Encounter (HOSPITAL_COMMUNITY): Payer: Self-pay | Admitting: Emergency Medicine

## 2014-09-16 ENCOUNTER — Emergency Department (HOSPITAL_COMMUNITY)
Admission: EM | Admit: 2014-09-16 | Discharge: 2014-09-16 | Discharge status: Against Medical Advice | Payer: Self-pay | Attending: Emergency Medicine | Admitting: Emergency Medicine

## 2014-09-16 DIAGNOSIS — Z791 Long term (current) use of non-steroidal anti-inflammatories (NSAID): Secondary | ICD-10-CM | POA: Insufficient documentation

## 2014-09-16 DIAGNOSIS — F319 Bipolar disorder, unspecified: Secondary | ICD-10-CM | POA: Insufficient documentation

## 2014-09-16 DIAGNOSIS — M1990 Unspecified osteoarthritis, unspecified site: Secondary | ICD-10-CM | POA: Insufficient documentation

## 2014-09-16 DIAGNOSIS — F4310 Post-traumatic stress disorder, unspecified: Secondary | ICD-10-CM | POA: Insufficient documentation

## 2014-09-16 DIAGNOSIS — E119 Type 2 diabetes mellitus without complications: Secondary | ICD-10-CM | POA: Insufficient documentation

## 2014-09-16 DIAGNOSIS — R519 Headache, unspecified: Secondary | ICD-10-CM

## 2014-09-16 DIAGNOSIS — R7989 Other specified abnormal findings of blood chemistry: Secondary | ICD-10-CM

## 2014-09-16 DIAGNOSIS — R51 Headache: Secondary | ICD-10-CM | POA: Insufficient documentation

## 2014-09-16 DIAGNOSIS — Z79899 Other long term (current) drug therapy: Secondary | ICD-10-CM | POA: Insufficient documentation

## 2014-09-16 DIAGNOSIS — R945 Abnormal results of liver function studies: Secondary | ICD-10-CM | POA: Insufficient documentation

## 2014-09-16 DIAGNOSIS — Z720 Tobacco use: Secondary | ICD-10-CM | POA: Insufficient documentation

## 2014-09-16 DIAGNOSIS — R1030 Lower abdominal pain, unspecified: Secondary | ICD-10-CM | POA: Insufficient documentation

## 2014-09-16 DIAGNOSIS — R52 Pain, unspecified: Secondary | ICD-10-CM

## 2014-09-16 DIAGNOSIS — Z8619 Personal history of other infectious and parasitic diseases: Secondary | ICD-10-CM | POA: Insufficient documentation

## 2014-09-16 DIAGNOSIS — Z8719 Personal history of other diseases of the digestive system: Secondary | ICD-10-CM | POA: Insufficient documentation

## 2014-09-16 LAB — COMPREHENSIVE METABOLIC PANEL
ALT: 295 U/L — AB (ref 0–53)
AST: 262 U/L — ABNORMAL HIGH (ref 0–37)
Albumin: 3.9 g/dL (ref 3.5–5.2)
Alkaline Phosphatase: 94 U/L (ref 39–117)
Anion gap: 9 (ref 5–15)
BUN: 9 mg/dL (ref 6–23)
CALCIUM: 9.6 mg/dL (ref 8.4–10.5)
CHLORIDE: 100 meq/L (ref 96–112)
CO2: 30 mEq/L (ref 19–32)
CREATININE: 0.94 mg/dL (ref 0.50–1.35)
GFR calc non Af Amer: >90 mL/min (ref >90)
GLUCOSE: 116 mg/dL — AB (ref 70–99)
Potassium: 4.2 mEq/L (ref 3.7–5.3)
Sodium: 139 mEq/L (ref 137–147)
Total Bilirubin: 0.6 mg/dL (ref 0.3–1.2)
Total Protein: 7.3 g/dL (ref 6.0–8.3)

## 2014-09-16 LAB — URINALYSIS, ROUTINE W REFLEX MICROSCOPIC
Bilirubin Urine: NEGATIVE
Glucose, UA: 500 mg/dL — AB
Ketones, ur: NEGATIVE mg/dL
Leukocytes, UA: NEGATIVE
Nitrite: NEGATIVE
PROTEIN: NEGATIVE mg/dL
Specific Gravity, Urine: 1.022 (ref 1.005–1.030)
Urobilinogen, UA: 1 mg/dL (ref 0.0–1.0)
pH: 8.5 — ABNORMAL HIGH (ref 5.0–8.0)

## 2014-09-16 LAB — CBC WITH DIFFERENTIAL/PLATELET
Basophils Absolute: 0 10*3/uL (ref 0.0–0.1)
Basophils Relative: 0 % (ref 0–1)
EOS PCT: 1 % (ref 0–5)
Eosinophils Absolute: 0.1 10*3/uL (ref 0.0–0.7)
HCT: 46.2 % (ref 39.0–52.0)
HEMOGLOBIN: 15.8 g/dL (ref 13.0–17.0)
LYMPHS ABS: 2.3 10*3/uL (ref 0.7–4.0)
Lymphocytes Relative: 34 % (ref 12–46)
MCH: 32.4 pg (ref 26.0–34.0)
MCHC: 34.2 g/dL (ref 30.0–36.0)
MCV: 94.7 fL (ref 78.0–100.0)
Monocytes Absolute: 0.4 10*3/uL (ref 0.1–1.0)
Monocytes Relative: 6 % (ref 3–12)
NEUTROS ABS: 3.9 10*3/uL (ref 1.7–7.7)
Neutrophils Relative %: 59 % (ref 43–77)
Platelets: 134 10*3/uL — ABNORMAL LOW (ref 150–400)
RBC: 4.88 MIL/uL (ref 4.22–5.81)
RDW: 12 % (ref 11.5–15.5)
WBC: 6.6 10*3/uL (ref 4.0–10.5)

## 2014-09-16 LAB — URINE MICROSCOPIC-ADD ON

## 2014-09-16 LAB — LIPASE, BLOOD: LIPASE: 40 U/L (ref 11–59)

## 2014-09-16 MED ORDER — DIPHENHYDRAMINE HCL 50 MG/ML IJ SOLN
50.0000 mg | Freq: Once | INTRAMUSCULAR | Status: DC
  Filled 2014-09-16: qty 1

## 2014-09-16 MED ORDER — PROMETHAZINE HCL 25 MG/ML IJ SOLN
25.0000 mg | Freq: Once | INTRAMUSCULAR | Status: DC
  Filled 2014-09-16: qty 1

## 2014-09-16 MED ORDER — KETOROLAC TROMETHAMINE 60 MG/2ML IM SOLN
60.0000 mg | Freq: Once | INTRAMUSCULAR | Status: DC
  Filled 2014-09-16: qty 2

## 2014-09-16 MED ORDER — DIPHENHYDRAMINE HCL 50 MG/ML IJ SOLN: 50.0000 mg | Freq: Once | INTRAMUSCULAR | Status: DC

## 2014-09-16 NOTE — ED Notes (Signed)
Pt c/o 6/10 bilateral lower abdominal pain, headache, and nausea x 1 week. Pt states he has been unable to urinate at all today. Pt states he experienced difficulty urinating last night and it was "yellow/red." NAD noted. VSS.

## 2014-09-16 NOTE — ED Notes (Signed)
Upon entering room to administer meds to patient, gown on floor, patient not in room.

## 2014-09-16 NOTE — ED Notes (Signed)
Dr. Rosalia Hammersay notified that patient eloped.

## 2014-09-16 NOTE — ED Provider Notes (Signed)
CSN: [id number]     Arrival date & time 09/16/14  1658 History   First MD Initiated Contact with Patient 09/16/14 2052     Chief Complaint  Patient presents with  . Abdominal Pain  . Nausea     (Consider location/radiation/quality/duration/timing/severity/associated sxs/prior Treatment) HPI 28 year old male comes in today complaining of some lower abdominal pain for a week. He describes it as sharp and stabbing in nature. He has had associated nausea and vomiting with food intake. He does have a history of kidney stones. He states he has taken ibuprofen and Aleve without relief. He has not vomited today. He states he has not eaten well today because he has been very busy. He denies fever, chills, or diarrhea. His bowel movements have been normal. He also complains of a headache which he states that he has had intermittently for "a long time". He denies any recent head injury other he has had head injuries in the past. He denies any lateralized weakness, vision changes, or difficulty walking or speaking. Past Medical History  Diagnosis Date  . Arthritis   . Diabetes mellitus without complication Borderline  . Overactive bladder   . PTSD (post-traumatic stress disorder)   . Bipolar 1 disorder   . Acid reflux   . 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 Gastritis    Past Surgical History  Procedure Laterality Date  . Wisdom tooth extraction     Family History  Problem Relation Age of Onset  . Hypertension Mother   . Hypertension Father    History  Substance Use Topics  . Smoking status: Current Every Day Smoker -- 0.50 packs/day    Types: Cigarettes  . Smokeless tobacco: Never Used  . Alcohol Use: No    Review of Systems  Unable to perform ROS All other systems reviewed and are negative.     Allergies  Review of patient's allergies indicates no known allergies.  Home Medications   Prior to Admission medications   Medication Sig Start Date End Date Taking? Authorizing  Provider  acetaminophen (TYLENOL) 500 MG tablet Take 500 mg by mouth every 6 (six) hours as needed.   Yes Historical Provider, MD  albuterol (PROVENTIL HFA;VENTOLIN HFA) 108 (90 BASE) MCG/ACT inhaler Inhale 1-2 puffs into the lungs every 6 (six) hours as needed for wheezing or shortness of breath. 09/02/14  Yes Courtney A Forcucci, PA-C  ibuprofen (ADVIL,MOTRIN) 800 MG tablet Take 1 tablet (800 mg total) by mouth 3 (three) times daily. 09/02/14  Yes Courtney A Forcucci, PA-C  Naproxen Sodium (ALEVE) 220 MG CAPS Take 220 mg by mouth every 8 (eight) hours as needed.   Yes Historical Provider, MD  QUEtiapine (SEROQUEL) 100 MG tablet Take 200 mg by mouth at bedtime.   Yes Historical Provider, MD   BP 115/78 mmHg  Pulse 83  Temp(Src) 98 F (36.7 C) (Oral)  Resp 12  Ht 5\' 8"  (1.727 m)  Wt 145 lb (65.772 kg)  BMI 22.05 kg/m2  SpO2 97% Physical Exam  Constitutional: He appears well-developed and well-nourished.  HENT:  Head: Normocephalic and atraumatic.  Eyes: Conjunctivae and EOM are normal. Pupils are equal, round, and reactive to light.  Neck: Normal range of motion.  Cardiovascular: Normal rate, regular rhythm, normal heart sounds and intact distal pulses.   Pulmonary/Chest: Effort normal and breath sounds normal.  Abdominal: Soft. There is tenderness.    Nursing note and vitals reviewed.   ED Course  Procedures (including critical care  time) Labs Review Labs Reviewed  CBC WITH DIFFERENTIAL - Abnormal; Notable for the following:    Platelets 134 (*)    All other components within normal limits  COMPREHENSIVE METABOLIC PANEL - Abnormal; Notable for the following:    Glucose, Bld 116 (*)    AST 262 (*)    ALT 295 (*)    All other components within normal limits  URINALYSIS, ROUTINE W REFLEX MICROSCOPIC - Abnormal; Notable for the following:    APPearance CLOUDY (*)    pH 8.5 (*)    Glucose, UA 500 (*)    Hgb urine dipstick MODERATE (*)    All other components within normal  limits  URINE MICROSCOPIC-ADD ON    Imaging Review No results found.   EKG Interpretation None      MDM   Final diagnoses:  Pain  Lower abdominal pain  Elevated LFTs  Nonintractable headache, unspecified chronicity pattern, unspecified headache type    Patient with abdominal pain for several days with some lower abdominal ttp.  ua with 21-50 rbc.  LFT elevated- lipase pending.  I discussed this with patient and ordered medicine for headache.  Patient left ama without notifying me.     Hilario Quarryanielle S Makinsley Schiavi, MD 09/17/14 [phone]

## 2014-10-22 ENCOUNTER — Ambulatory Visit: Payer: Self-pay

## 2014-10-22 ENCOUNTER — Ambulatory Visit: Payer: Self-pay | Attending: Family Medicine

## 2014-12-05 ENCOUNTER — Emergency Department (HOSPITAL_COMMUNITY)
Admission: EM | Admit: 2014-12-05 | Discharge: 2014-12-05 | Disposition: A | Discharge status: Home / Self Care | Payer: Self-pay | Attending: Emergency Medicine | Admitting: Emergency Medicine

## 2014-12-05 ENCOUNTER — Encounter (HOSPITAL_COMMUNITY): Payer: Self-pay | Admitting: Emergency Medicine

## 2014-12-05 DIAGNOSIS — M1990 Unspecified osteoarthritis, unspecified site: Secondary | ICD-10-CM | POA: Insufficient documentation

## 2014-12-05 DIAGNOSIS — Z720 Tobacco use: Secondary | ICD-10-CM | POA: Insufficient documentation

## 2014-12-05 DIAGNOSIS — Z791 Long term (current) use of non-steroidal anti-inflammatories (NSAID): Secondary | ICD-10-CM | POA: Insufficient documentation

## 2014-12-05 DIAGNOSIS — Z8719 Personal history of other diseases of the digestive system: Secondary | ICD-10-CM | POA: Insufficient documentation

## 2014-12-05 DIAGNOSIS — Z8619 Personal history of other infectious and parasitic diseases: Secondary | ICD-10-CM | POA: Insufficient documentation

## 2014-12-05 DIAGNOSIS — Z8659 Personal history of other mental and behavioral disorders: Secondary | ICD-10-CM | POA: Insufficient documentation

## 2014-12-05 DIAGNOSIS — E119 Type 2 diabetes mellitus without complications: Secondary | ICD-10-CM | POA: Insufficient documentation

## 2014-12-05 DIAGNOSIS — J020 Streptococcal pharyngitis: Secondary | ICD-10-CM | POA: Insufficient documentation

## 2014-12-05 DIAGNOSIS — Z87448 Personal history of other diseases of urinary system: Secondary | ICD-10-CM | POA: Insufficient documentation

## 2014-12-05 LAB — RAPID STREP SCREEN (MED CTR MEBANE ONLY): Streptococcus, Group A Screen (Direct): POSITIVE — AB

## 2014-12-05 MED ORDER — HYDROCODONE-ACETAMINOPHEN 7.5-325 MG/15ML PO SOLN: 10.0000 mL | Freq: Four times a day (QID) | ORAL | Status: DC | PRN

## 2014-12-05 MED ORDER — PENICILLIN V POTASSIUM 500 MG PO TABS: 500.0000 mg | ORAL_TABLET | Freq: Three times a day (TID) | ORAL | Status: AC

## 2014-12-05 NOTE — ED Provider Notes (Signed)
CSN: [id number]     Arrival date & time 12/05/14  1458 History  This chart was scribed for non-physician practitioner, Junius Finner, PA-C, working with Lyanne Co, MD by Milly Jakob, ED Scribe. The patient was seen in room WTR7/WTR7. Patient's care was started at 3:57 PM.   Chief Complaint  Patient presents with  . Oral Swelling   The history is provided by the patient. No language interpreter was used.   HPI Comments: Cole Morrison is a 29 y.o. male who presents to the Emergency Department complaining of sore throat with associated swelling which began 2 days ago. He reports that his pain is exacerbated by swallowing and he reports difficulty sleeping due to pain. Pain is 7/10 at worst. He reports associated subjective fever and minimal SOB. He denies taking anything for this, and states that he is allergic to Motrin. He denies allergy to ABX and having a PCP.   Past Medical History  Diagnosis Date  . Arthritis   . Diabetes mellitus without complication Borderline  . Overactive bladder   . PTSD (post-traumatic stress disorder)   . Bipolar 1 disorder   . Acid reflux   . 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 Gastritis    Past Surgical History  Procedure Laterality Date  . Wisdom tooth extraction     Family History  Problem Relation Age of Onset  . Hypertension Mother   . Hypertension Father    History  Substance Use Topics  . Smoking status: Current Every Day Smoker -- 0.50 packs/day    Types: Cigarettes  . Smokeless tobacco: Never Used  . Alcohol Use: No    Review of Systems  Constitutional: Positive for fever.  HENT: Positive for sore throat. Negative for congestion.   All other systems reviewed and are negative.   Allergies  Ibuprofen  Home Medications   Prior to Admission medications   Medication Sig Start Date End Date Taking? Authorizing Provider  Aspirin-Acetaminophen-Caffeine [phone] MG PACK Take 1 Package by mouth every 6 (six) hours as  needed (pain).   Yes Historical Provider, MD  albuterol (PROVENTIL HFA;VENTOLIN HFA) 108 (90 BASE) MCG/ACT inhaler Inhale 1-2 puffs into the lungs every 6 (six) hours as needed for wheezing or shortness of breath. Patient not taking: Reported on 12/05/2014 09/02/14   Courtney A Forcucci, PA-C  HYDROcodone-acetaminophen (HYCET) 7.5-325 mg/15 ml solution Take 10 mLs by mouth every 6 (six) hours as needed for moderate pain or severe pain. 12/05/14   Junius Finner, PA-C  ibuprofen (ADVIL,MOTRIN) 800 MG tablet Take 1 tablet (800 mg total) by mouth 3 (three) times daily. Patient not taking: Reported on 12/05/2014 09/02/14   Courtney A Forcucci, PA-C  penicillin v potassium (VEETID) 500 MG tablet Take 1 tablet (500 mg total) by mouth 3 (three) times daily. For 10 days. 12/05/14 12/12/14  Junius Finner, PA-C   Triage Vitals: BP 115/82 mmHg  Pulse 100  Temp(Src) 99.3 F (37.4 C) (Oral)  Resp 18  SpO2 96% Physical Exam  Constitutional: He is oriented to person, place, and time. He appears well-developed and well-nourished.  HENT:  Head: Normocephalic and atraumatic.  Mouth/Throat: Uvula is midline and mucous membranes are normal. No trismus in the jaw. Posterior oropharyngeal edema and posterior oropharyngeal erythema present. No oropharyngeal exudate or tonsillar abscesses.  Tonsillar erythema with mild edema. No exudate.   Eyes: EOM are normal.  Neck: Normal range of motion.  Cardiovascular: Normal rate.   Pulmonary/Chest: Effort normal.  Musculoskeletal: Normal range of motion.  Lymphadenopathy:    He has cervical adenopathy (anterior).  Neurological: He is alert and oriented to person, place, and time.  Skin: Skin is warm and dry.  Psychiatric: He has a normal mood and affect. His behavior is normal.  Nursing note and vitals reviewed.   ED Course  Procedures (including critical care time) DIAGNOSTIC STUDIES: Oxygen Saturation is 96% on room air, normal by my interpretation.    COORDINATION  OF CARE: 4:02 PM-Discussed treatment plan which includes a rapid strep test with pt at bedside and pt agreed to plan.   Labs Review Labs Reviewed  RAPID STREP SCREEN - Abnormal; Notable for the following:    Streptococcus, Group A Screen (Direct) POSITIVE (*)    All other components within normal limits    Imaging Review No results found.   EKG Interpretation None      MDM   Final diagnoses:  Strep pharyngitis    Rapid strep: positive. Will tx with PO PCN. Home care instructions provided. Advised to f/u with PCP for recheck of symptoms as needed. Return precautions provided. Pt verbalized understanding and agreement with tx plan.   I personally performed the services described in this documentation, which was scribed in my presence. The recorded information has been reviewed and is accurate.     Junius Finnerrin O'Malley, PA-C 12/05/14 1932  Lyanne CoKevin M Campos, MD 12/06/14 [id number]

## 2014-12-05 NOTE — ED Notes (Signed)
Pt states his throat began swelling a few days ago, has been having trouble sleeping and feeling increasingly SOB. Pt able to talk without restrictions, no obvious swelling visible to outer throat.

## 2014-12-05 NOTE — ED Notes (Signed)
Pt escorted to discharge window. Verbalized understanding discharge instructions. In no acute distress.  Pt given a bus pass.  

## 2016-07-22 ENCOUNTER — Inpatient Hospital Stay
Admission: RE | Admit: 2016-07-22 | Discharge: 2016-07-24 | DRG: 885 | Disposition: A | Discharge status: Home / Self Care | Payer: No Typology Code available for payment source | Source: Intra-hospital | Attending: Psychiatry | Admitting: Psychiatry

## 2016-07-22 ENCOUNTER — Encounter: Payer: Self-pay | Admitting: Psychiatry

## 2016-07-22 DIAGNOSIS — R45851 Suicidal ideations: Secondary | ICD-10-CM | POA: Diagnosis present

## 2016-07-22 DIAGNOSIS — F319 Bipolar disorder, unspecified: Secondary | ICD-10-CM | POA: Diagnosis present

## 2016-07-22 DIAGNOSIS — F1120 Opioid dependence, uncomplicated: Secondary | ICD-10-CM | POA: Diagnosis present

## 2016-07-22 DIAGNOSIS — F1994 Other psychoactive substance use, unspecified with psychoactive substance-induced mood disorder: Secondary | ICD-10-CM | POA: Diagnosis present

## 2016-07-22 DIAGNOSIS — Y905 Blood alcohol level of 100-119 mg/100 ml: Secondary | ICD-10-CM | POA: Diagnosis present

## 2016-07-22 DIAGNOSIS — F17210 Nicotine dependence, cigarettes, uncomplicated: Secondary | ICD-10-CM | POA: Diagnosis present

## 2016-07-22 DIAGNOSIS — F329 Major depressive disorder, single episode, unspecified: Secondary | ICD-10-CM | POA: Diagnosis present

## 2016-07-22 DIAGNOSIS — G4700 Insomnia, unspecified: Secondary | ICD-10-CM | POA: Diagnosis present

## 2016-07-22 DIAGNOSIS — F1020 Alcohol dependence, uncomplicated: Secondary | ICD-10-CM | POA: Diagnosis present

## 2016-07-22 DIAGNOSIS — Z915 Personal history of self-harm: Secondary | ICD-10-CM

## 2016-07-22 DIAGNOSIS — R45850 Homicidal ideations: Secondary | ICD-10-CM | POA: Diagnosis present

## 2016-07-22 DIAGNOSIS — Z590 Homelessness: Secondary | ICD-10-CM

## 2016-07-22 DIAGNOSIS — F17200 Nicotine dependence, unspecified, uncomplicated: Secondary | ICD-10-CM | POA: Diagnosis present

## 2016-07-22 DIAGNOSIS — F3130 Bipolar disorder, current episode depressed, mild or moderate severity, unspecified: Secondary | ICD-10-CM | POA: Diagnosis present

## 2016-07-22 MED ORDER — CHLORDIAZEPOXIDE HCL 25 MG PO CAPS
25.0000 mg | ORAL_CAPSULE | Freq: Four times a day (QID) | ORAL | Status: DC
  Administered 2016-07-22: 25 mg via ORAL
  Filled 2016-07-22 (×2): qty 1

## 2016-07-22 MED ORDER — ALUM & MAG HYDROXIDE-SIMETH 200-200-20 MG/5ML PO SUSP: 30.0000 mL | ORAL | Status: DC | PRN

## 2016-07-22 MED ORDER — MAGNESIUM HYDROXIDE 400 MG/5ML PO SUSP: 30.0000 mL | Freq: Every day | ORAL | Status: DC | PRN

## 2016-07-22 MED ORDER — ACETAMINOPHEN 325 MG PO TABS: 650.0000 mg | ORAL_TABLET | Freq: Four times a day (QID) | ORAL | Status: DC | PRN

## 2016-07-22 MED ORDER — TRAZODONE HCL 100 MG PO TABS
100.0000 mg | ORAL_TABLET | Freq: Every evening | ORAL | Status: DC | PRN
  Filled 2016-07-22: qty 1

## 2016-07-22 MED ORDER — NICOTINE 21 MG/24HR TD PT24
21.0000 mg | MEDICATED_PATCH | Freq: Every day | TRANSDERMAL | Status: DC
  Administered 2016-07-23: 21 mg via TRANSDERMAL
  Filled 2016-07-22: qty 1
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
## 2016-07-22 NOTE — Plan of Care (Signed)
Problem: Safety: Goal: Ability to remain free from injury will improve Outcome: Progressing Denies suicidal ideation at present  Time

## 2016-07-22 NOTE — BH Assessment (Signed)
[facility].  Accepting physician is Dr. Jennet MaduroPucilowska.  Attending Physician will be Dr. Jennet MaduroPucilowska.  Patient has been assigned to room 324, by [facility] Charge Nurse DavenportJennifer.  Call report to [phone].  Representative/Transfer Coordinator is Dyanne IhaCalvin  [facility] Staff Lillia Abed([place], [facility]) made aware of acceptance.
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
## 2016-07-23 DIAGNOSIS — F314 Bipolar disorder, current episode depressed, severe, without psychotic features: Secondary | ICD-10-CM

## 2016-07-23 LAB — COMPREHENSIVE METABOLIC PANEL
ALK PHOS: 68 U/L (ref 38–126)
ALT: 22 U/L (ref 17–63)
AST: 47 U/L — AB (ref 15–41)
Albumin: 4.2 g/dL (ref 3.5–5.0)
Anion gap: 7 (ref 5–15)
BUN: 9 mg/dL (ref 6–20)
CALCIUM: 9.3 mg/dL (ref 8.9–10.3)
CHLORIDE: 101 mmol/L (ref 101–111)
CO2: 31 mmol/L (ref 22–32)
CREATININE: 0.94 mg/dL (ref 0.61–1.24)
GFR calc Af Amer: >60 mL/min (ref >60)
Glucose, Bld: 99 mg/dL (ref 65–99)
Potassium: 3.6 mmol/L (ref 3.5–5.1)
Sodium: 139 mmol/L (ref 135–145)
Total Bilirubin: 1.2 mg/dL (ref 0.3–1.2)
Total Protein: 7.2 g/dL (ref 6.5–8.1)

## 2016-07-23 LAB — CBC
HCT: 45.6 % (ref 40.0–52.0)
Hemoglobin: 15.8 g/dL (ref 13.0–18.0)
MCH: 32.8 pg (ref 26.0–34.0)
MCHC: 34.7 g/dL (ref 32.0–36.0)
MCV: 94.3 fL (ref 80.0–100.0)
PLATELETS: 165 10*3/uL (ref 150–440)
RBC: 4.83 MIL/uL (ref 4.40–5.90)
RDW: 12.6 % (ref 11.5–14.5)
WBC: 8.4 10*3/uL (ref 3.8–10.6)
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
## 2016-07-23 NOTE — Plan of Care (Signed)
Problem: Pain Managment: Goal: General experience of comfort will improve Outcome: Not Progressing Patient continued to complain of pain. Refused all medications ordered.

## 2016-07-23 NOTE — [facility] Suicide Risk Assessment (Signed)
[facility] INPATIENT:  Family/Significant Other Suicide Prevention Education  Suicide Prevention Education:  Patient Refusal for Family/Significant Other Suicide Prevention Education: The patient Cole Morrison has refused to provide written consent for family/significant other to be provided Family/Significant Other Suicide Prevention Education during admission and/or prior to discharge.  Physician notified.  Lynden OxfordKadijah R Kyel Purk, MSW, LCSW-A 07/23/2016, 3:49 PM

## 2016-07-23 NOTE — BHH Group Notes (Signed)
[facility] LCSW Group Therapy   07/23/2016  9:30am  Type of Therapy: Group Therapy   Participation Level: Did Not Attend. Patient invited to participate but declined.    Alegra Rost F. Khalib Fendley, MSW, LCSWA, LCAS     

## 2016-07-23 NOTE — [facility] Group Notes (Signed)
[facility] Group Notes:  (Nursing/MHT/Case Management/Adjunct)  Date:  07/23/2016  Time:  2:51 AM  Type of Therapy:  Group Therapy  Participation Level:  Did Not Attend   Cole Morrison 07/23/2016, 2:51 AM

## 2016-07-23 NOTE — [facility] Group Notes (Signed)
Goals Group  Date/Time: 07/23/2016 9am  Type of Therapy and Topic: Group Therapy: Goals Group: SMART Goals   Pt was called, but did not attend   Cole Morrison F. Caylah Plouff, LCSWA, LCAS  

## 2016-07-23 NOTE — [facility] Group Notes (Signed)
[facility] Group Notes:  (Nursing/MHT/Case Management/Adjunct)  Date:  07/23/2016  Time:  4:07 PM  Type of Therapy:  Psychoeducational Skills  Participation Level:  Did Not Attend  Noelle PennerKristen J Janaisha Tolsma 07/23/2016, 4:07 PM

## 2016-07-23 NOTE — Plan of Care (Signed)
Problem: Safety: Goal: Ability to remain free from injury will improve Outcome: Progressing no PRN given, 15 minute checks maintained for safety, clinical and moral support provided, patient encouraged to continue to express feelings and demonstrate safe care. Patient remains free from harm, will continue to monitor.      
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
## 2016-07-23 NOTE — [facility] Group Notes (Signed)
[facility] Group Notes:  (Nursing/MHT/Case Management/Adjunct)  Date:  07/23/2016  Time:  11:30 PM  Type of Therapy:  Psychoeducational Skills  Participation Level:  Did Not Attend  Summary of Progress/Problems:  Cole MilroyLaquanda Y Cori Morrison 07/23/2016, 11:30 PM

## 2016-07-23 NOTE — Tx Team (Signed)
Interdisciplinary Treatment and Diagnostic Plan Update  07/23/2016 Time of Session: 10:30AM Cole Morrison MRN: [id number]  Principal Diagnosis: Bipolar disorder current episode depressed [name]  Secondary Diagnoses: Principal Problem:   Bipolar disorder current episode depressed (HCC) Active Problems:   Substance induced mood disorder (HCC)   Alcohol use disorder, moderate, dependence (HCC)   Tobacco use disorder   Current Medications:  Current Facility-Administered Medications  Medication Dose Route Frequency Provider Last Rate Last Dose  . acetaminophen (TYLENOL) tablet 650 mg  650 mg Oral Q6H PRN Jolanta B Pucilowska, MD      . alum & mag hydroxide-simeth (MAALOX/MYLANTA) 200-200-20 MG/5ML suspension 30 mL  30 mL Oral Q4H PRN Jolanta B Pucilowska, MD      . magnesium hydroxide (MILK OF MAGNESIA) suspension 30 mL  30 mL Oral Daily PRN Jolanta B Pucilowska, MD      . nicotine (NICODERM CQ - dosed in mg/24 hours) patch 21 mg  21 mg Transdermal Q0600 Shari ProwsJolanta B Pucilowska, MD   21 mg at 07/23/16 0619  . traZODone (DESYREL) tablet 100 mg  100 mg Oral QHS PRN Shari ProwsJolanta B Pucilowska, MD       PTA Medications: Prescriptions Prior to Admission  Medication Sig Dispense Refill Last Dose  . albuterol (PROVENTIL HFA;VENTOLIN HFA) 108 (90 BASE) MCG/ACT inhaler Inhale 1-2 puffs into the lungs every 6 (six) hours as needed for wheezing or shortness of breath. (Patient not taking: Reported on 12/05/2014) 1 Inhaler 0 Completed Course at Unknown time  . Aspirin-Acetaminophen-Caffeine 500-325-65 MG PACK Take 1 Package by mouth every 6 (six) hours as needed (pain).   12/05/2014 at Unknown time  . HYDROcodone-acetaminophen (HYCET) 7.5-325 mg/15 ml solution Take 10 mLs by mouth every 6 (six) hours as needed for moderate pain or severe pain. 80 mL 0   . ibuprofen (ADVIL,MOTRIN) 800 MG tablet Take 1 tablet (800 mg total) by mouth 3 (three) times daily. (Patient not taking: Reported on 12/05/2014) 21 tablet 0 Not  Taking at Unknown time    Patient Stressors: Financial difficulties Substance abuse  Patient Strengths: Ability for insight Active sense of humor Capable of independent living Communication skills Supportive family/friends  Treatment Modalities: Medication Management, Group therapy, Case management,  1 to 1 session with clinician, Psychoeducation, Recreational therapy.   Physician Treatment Plan for Primary Diagnosis: Bipolar disorder current episode depressed (HCC) Long Term Goal(s): Improvement in symptoms so as ready for discharge Improvement in symptoms so as ready for discharge   Short Term Goals: Ability to identify changes in lifestyle to reduce recurrence of condition will improve Ability to verbalize feelings will improve Ability to disclose and discuss suicidal ideas Ability to demonstrate self-control will improve Ability to identify and develop effective coping behaviors will improve Ability to maintain clinical measurements within normal limits will improve Compliance with prescribed medications will improve Ability to identify changes in lifestyle to reduce recurrence of condition will improve Ability to demonstrate self-control will improve Ability to identify triggers associated with substance abuse/mental health issues will improve  Medication Management: Evaluate patient's response, side effects, and tolerance of medication regimen.  Therapeutic Interventions: 1 to 1 sessions, Unit Group sessions and Medication administration.  Evaluation of Outcomes: Progressing  Physician Treatment Plan for Secondary Diagnosis: Principal Problem:   Bipolar disorder current episode depressed (HCC) Active Problems:   Substance induced mood disorder (HCC)   Alcohol use disorder, moderate, dependence (HCC)   Tobacco use disorder  Long Term Goal(s): Improvement in symptoms so as ready for discharge Improvement  in symptoms so as ready for discharge   Short Term Goals:  Ability to identify changes in lifestyle to reduce recurrence of condition will improve Ability to verbalize feelings will improve Ability to disclose and discuss suicidal ideas Ability to demonstrate self-control will improve Ability to identify and develop effective coping behaviors will improve Ability to maintain clinical measurements within normal limits will improve Compliance with prescribed medications will improve Ability to identify changes in lifestyle to reduce recurrence of condition will improve Ability to demonstrate self-control will improve Ability to identify triggers associated with substance abuse/mental health issues will improve     Medication Management: Evaluate patient's response, side effects, and tolerance of medication regimen.  Therapeutic Interventions: 1 to 1 sessions, Unit Group sessions and Medication administration.  Evaluation of Outcomes: Progressing   RN Treatment Plan for Primary Diagnosis: Bipolar disorder current episode depressed (HCC) Long Term Goal(s): Knowledge of disease and therapeutic regimen to maintain health will improve  Short Term Goals: Ability to remain free from injury will improve, Ability to verbalize frustration and anger appropriately will improve, Ability to identify and develop effective coping behaviors will improve and Compliance with prescribed medications will improve  Medication Management: RN will administer medications as ordered by provider, will assess and evaluate patient's response and provide education to patient for prescribed medication. RN will report any adverse and/or side effects to prescribing provider.  Therapeutic Interventions: 1 on 1 counseling sessions, Psychoeducation, Medication administration, Evaluate responses to treatment, Monitor vital signs and CBGs as ordered, Perform/monitor CIWA, COWS, AIMS and Fall Risk screenings as ordered, Perform wound care treatments as ordered.  Evaluation of Outcomes:  Progressing   LCSW Treatment Plan for Primary Diagnosis: Bipolar disorder current episode depressed (HCC) Long Term Goal(s): Safe transition to appropriate next level of care at discharge, Engage patient in therapeutic group addressing interpersonal concerns.  Short Term Goals: Engage patient in aftercare planning with referrals and resources, Increase social support, Identify triggers associated with mental health/substance abuse issues and Increase skills for wellness and recovery  Therapeutic Interventions: Assess for all discharge needs, 1 to 1 time with Social worker, Explore available resources and support systems, Assess for adequacy in community support network, Educate family and significant other(s) on suicide prevention, Complete Psychosocial Assessment, Interpersonal group therapy.  Evaluation of Outcomes: Progressing   Progress in Treatment: Attending groups: No. Participating in groups: No. Taking medication as prescribed: Yes. Toleration medication: Yes. Family/Significant other contact made: No, will contact:  Pt refused family contact Patient understands diagnosis: Yes. Discussing patient identified problems/goals with staff: Yes. Medical problems stabilized or resolved: Yes. Denies suicidal/homicidal ideation: Yes. Issues/concerns per patient self-inventory: No. Other: NONE  New problem(s) identified: No, Describe:  NONE  New Short Term/Long Term Goal(s):  Discharge Plan or Barriers:   Reason for Continuation of Hospitalization: Depression Homicidal ideation Suicidal ideation  Estimated Length of Stay: 3 days   Attendees: Patient: Cole Morrison 07/23/2016 4:09 PM  Physician: Kristine Linea, MD 07/23/2016 4:09 PM  Nursing: Hulan Amato, RN 07/23/2016 4:09 PM  RN Care Manager: 07/23/2016 4:09 PM  Social Worker: Hampton Abbot, MSW, LCSW-A 07/23/2016 4:09 PM   Scribe for Treatment Team: Lynden Oxford, LCSWA 07/23/2016 4:09 PM

## 2016-07-23 NOTE — Progress Notes (Signed)
Patient irritable this shift. Refused medications scheduled. Complains of headache refused medications that was ordered. Only wanted suboxone. Denies SI, HI, AVH. Requested 72 hour form for discharge, patient signed. Md notified. Encouragement and support offered, Pt stayed in room in bed, does not eat, states he does not want to eat in front of people. Informed patient that he could eat when all patients left. Pt still refused to eat stating he wanted the suboxone or hydrocodone to help.  Pt remains safe on unit with q 15 min checks.
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
## 2016-07-24 MED ORDER — NALOXONE HCL 4 MG/0.1ML NA LIQD: NASAL | 1 refills | Status: DC

## 2016-07-24 NOTE — Progress Notes (Signed)
Patient discharged home. DC instructions provided and explained. Medications reviewed. Rx given. All questions answered. Denies SI, HI, AVH. No psychosis noted. Belongings returned. Pt stable at discharge
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
## 2016-07-24 NOTE — BHH Suicide Risk Assessment (Signed)
[facility]   Principal Problem: Bipolar disorder current episode depressed [facility](HCC) Discharge Diagnoses:  Patient Active Problem List   Diagnosis Date Noted  . Tobacco use disorder [F17.200] 07/22/2016  . Opioid use disorder, moderate, dependence (HCC) [F11.20] 01/16/2014  . Alcohol use disorder, moderate, dependence (HCC) [F10.20] 01/16/2014  . Bipolar disorder current episode depressed (HCC) [F31.30] 01/16/2014  . Substance induced mood disorder (HCC) [F19.94] 01/15/2014    Total Time spent with patient: 30 minutes  Musculoskeletal: Strength & Muscle Tone: within normal limits Gait & Station: normal Patient leans: N/A  Psychiatric Specialty Exam: Review of Systems  All other systems reviewed and are negative.   Blood pressure 117/84, pulse 70, temperature 98.2 F (36.8 C), temperature source Oral, resp. rate 18, height 5\' 8"  (1.727 m), weight 65.3 kg (144 lb), SpO2 99 %.Body mass index is 21.9 kg/m.  General Appearance: Casual  Eye Contact::  Fair  Speech:  Clear and Coherent409  Volume:  Normal  Mood:  Euthymic  Affect:  Appropriate  Thought Process:  Goal Directed  Orientation:  Full (Time, Place, and Person)  Thought Content:  WDL  Suicidal Thoughts:  No  Homicidal Thoughts:  No  Memory:  Immediate;   Fair Recent;   Fair Remote;   Fair  Judgement:  Poor  Insight:  Lacking  Psychomotor Activity:  Normal  Concentration:  Fair  Recall:  F[employer]Fair  Fund of Knowledge:Fair  Language: Fair  Akathisia:  No  Handed:  Right  AIMS (if indicated):     Assets:  Communication Skills Desire for Improvement Physical Health Resilience  Sleep:  Number of Hours: 7  Cognition: WNL  ADL's:  Intact   Mental Status Per Nursing Assessment::   On Admission:     Demographic Factors:  Male, Adolescent or young adult, Caucasian, Low socioeconomic status and Unemployed  Loss Factors: Loss of significant relationship and Financial problems/change in  socioeconomic status  Historical Factors: Prior suicide attempts and Impulsivity  Risk Reduction Factors:   NA  Continued Clinical Symptoms:  Bipolar Disorder:   Depressive phase Alcohol/Substance Abuse/Dependencies  Cognitive Features That Contribute To Risk:  None    Suicide Risk:  Minimal: No identifiable suicidal ideation.  Patients presenting with no risk factors but with morbid ruminations; may be classified as minimal risk based on the severity of the depressive symptoms    Plan Of Care/Follow-up recommendations:  Activity:  aas tolerated. Diet:  regular. Other:  keep follow up appointments.  Kristine LineaJolanta Brinly Maietta, MD 07/24/2016, 9:02 AM

## 2016-07-24 NOTE — Progress Notes (Signed)
D: Pt denies SI/HI/AVH, Pt is pleasant and cooperative, affect is flat but brightens upon approach. Pt stated he feels better from rest and medication. Thoughts are  Organized, appears less anxious. Patient still isolates to room minimal interaction with staff.   A: Pt was offered support and encouragement. Pt was given scheduled medications. Pt was encouraged to attend groups. Q 15 minute checks were done for safety.  R:Pt did not attend group.Pt is compliant with medication. Pt has no complaints.Pt receptive to treatment and safety maintained on unit.

## 2019-02-03 ENCOUNTER — Emergency Department (HOSPITAL_COMMUNITY)
Admission: EM | Admit: 2019-02-03 | Discharge: 2019-02-03 | Disposition: A | Discharge status: Home / Self Care | Payer: Medicaid - Out of State | Attending: Emergency Medicine | Admitting: Emergency Medicine

## 2019-02-03 ENCOUNTER — Other Ambulatory Visit: Payer: Self-pay

## 2019-02-03 ENCOUNTER — Emergency Department (HOSPITAL_COMMUNITY)
Admission: EM | Admit: 2019-02-03 | Discharge: 2019-02-04 | Disposition: A | Discharge status: Home / Self Care | Payer: Medicaid - Out of State | Source: Home / Self Care | Attending: Emergency Medicine | Admitting: Emergency Medicine

## 2019-02-03 ENCOUNTER — Encounter (HOSPITAL_COMMUNITY): Payer: Self-pay

## 2019-02-03 ENCOUNTER — Encounter (HOSPITAL_COMMUNITY): Payer: Self-pay | Admitting: Emergency Medicine

## 2019-02-03 DIAGNOSIS — R45851 Suicidal ideations: Secondary | ICD-10-CM | POA: Insufficient documentation

## 2019-02-03 DIAGNOSIS — F332 Major depressive disorder, recurrent severe without psychotic features: Secondary | ICD-10-CM | POA: Insufficient documentation

## 2019-02-03 DIAGNOSIS — F1994 Other psychoactive substance use, unspecified with psychoactive substance-induced mood disorder: Secondary | ICD-10-CM

## 2019-02-03 DIAGNOSIS — E119 Type 2 diabetes mellitus without complications: Secondary | ICD-10-CM

## 2019-02-03 DIAGNOSIS — F329 Major depressive disorder, single episode, unspecified: Secondary | ICD-10-CM | POA: Diagnosis present

## 2019-02-03 DIAGNOSIS — Z79899 Other long term (current) drug therapy: Secondary | ICD-10-CM | POA: Insufficient documentation

## 2019-02-03 DIAGNOSIS — F1120 Opioid dependence, uncomplicated: Secondary | ICD-10-CM | POA: Diagnosis present

## 2019-02-03 DIAGNOSIS — F17210 Nicotine dependence, cigarettes, uncomplicated: Secondary | ICD-10-CM | POA: Insufficient documentation

## 2019-02-03 DIAGNOSIS — F1910 Other psychoactive substance abuse, uncomplicated: Secondary | ICD-10-CM

## 2019-02-03 DIAGNOSIS — F1194 Opioid use, unspecified with opioid-induced mood disorder: Secondary | ICD-10-CM

## 2019-02-03 LAB — RAPID URINE DRUG SCREEN, HOSP PERFORMED
Amphetamines: POSITIVE — AB
Barbiturates: NOT DETECTED
Benzodiazepines: NOT DETECTED
Cocaine: NOT DETECTED
Opiates: POSITIVE — AB
Tetrahydrocannabinol: POSITIVE — AB

## 2019-02-03 LAB — COMPREHENSIVE METABOLIC PANEL
ALT: 22 U/L (ref 0–44)
AST: 24 U/L (ref 15–41)
Albumin: 4.3 g/dL (ref 3.5–5.0)
Alkaline Phosphatase: 68 U/L (ref 38–126)
Anion gap: 6 (ref 5–15)
BUN: 12 mg/dL (ref 6–20)
CO2: 33 mmol/L — ABNORMAL HIGH (ref 22–32)
Calcium: 9.5 mg/dL (ref 8.9–10.3)
Chloride: 98 mmol/L (ref 98–111)
Creatinine, Ser: 0.95 mg/dL (ref 0.61–1.24)
GFR calc Af Amer: >60 mL/min (ref >60)
GFR calc non Af Amer: >60 mL/min (ref >60)
Glucose, Bld: 85 mg/dL (ref 70–99)
Potassium: 3.5 mmol/L (ref 3.5–5.1)
Sodium: 137 mmol/L (ref 135–145)
Total Bilirubin: 0.8 mg/dL (ref 0.3–1.2)
Total Protein: 7.5 g/dL (ref 6.5–8.1)

## 2019-02-03 LAB — ETHANOL: Alcohol, Ethyl (B): <10 mg/dL (ref <10)

## 2019-02-03 LAB — CBC WITH DIFFERENTIAL/PLATELET
Abs Immature Granulocytes: 0.06 10*3/uL (ref 0.00–0.07)
Basophils Absolute: 0 10*3/uL (ref 0.0–0.1)
Basophils Relative: 0 %
Eosinophils Absolute: 0.3 10*3/uL (ref 0.0–0.5)
Eosinophils Relative: 2 %
HCT: 42.5 % (ref 39.0–52.0)
Hemoglobin: 14 g/dL (ref 13.0–17.0)
Immature Granulocytes: 0 %
Lymphocytes Relative: 25 %
Lymphs Abs: 3.6 10*3/uL (ref 0.7–4.0)
MCH: 30 pg (ref 26.0–34.0)
MCHC: 32.9 g/dL (ref 30.0–36.0)
MCV: 91 fL (ref 80.0–100.0)
Monocytes Absolute: 1.1 10*3/uL — ABNORMAL HIGH (ref 0.1–1.0)
Monocytes Relative: 8 %
Neutro Abs: 9.1 10*3/uL — ABNORMAL HIGH (ref 1.7–7.7)
Neutrophils Relative %: 65 %
Platelets: 161 10*3/uL (ref 150–400)
RBC: 4.67 MIL/uL (ref 4.22–5.81)
RDW: 11.8 % (ref 11.5–15.5)
WBC: 14.1 10*3/uL — ABNORMAL HIGH (ref 4.0–10.5)
nRBC: 0 % (ref 0.0–0.2)

## 2019-02-03 LAB — ACETAMINOPHEN LEVEL: Acetaminophen (Tylenol), Serum: <10 ug/mL — ABNORMAL LOW (ref 10–30)

## 2019-02-03 LAB — SALICYLATE LEVEL: Salicylate Lvl: <7 mg/dL (ref 2.8–30.0)

## 2019-02-03 MED ORDER — HYDROXYZINE HCL 25 MG PO TABS
25.0000 mg | ORAL_TABLET | Freq: Four times a day (QID) | ORAL | Status: DC | PRN
  Filled 2019-02-03: qty 1

## 2019-02-03 MED ORDER — LOPERAMIDE HCL 2 MG PO CAPS: 2.0000 mg | ORAL_CAPSULE | ORAL | Status: DC | PRN

## 2019-02-03 MED ORDER — GABAPENTIN 400 MG PO CAPS
400.0000 mg | ORAL_CAPSULE | Freq: Four times a day (QID) | ORAL | Status: DC
  Administered 2019-02-03 – 2019-02-04 (×2): 400 mg via ORAL
  Filled 2019-02-03 (×2): qty 1

## 2019-02-03 MED ORDER — ACETAMINOPHEN 325 MG PO TABS: 650.0000 mg | ORAL_TABLET | ORAL | Status: DC | PRN

## 2019-02-03 MED ORDER — CLONIDINE HCL 0.1 MG PO TABS
0.1000 mg | ORAL_TABLET | Freq: Four times a day (QID) | ORAL | Status: DC
  Filled 2019-02-03: qty 1

## 2019-02-03 MED ORDER — QUETIAPINE FUMARATE 100 MG PO TABS
100.0000 mg | ORAL_TABLET | Freq: Two times a day (BID) | ORAL | Status: DC
  Administered 2019-02-03: 11:00:00 100 mg via ORAL
  Filled 2019-02-03: qty 1

## 2019-02-03 MED ORDER — GABAPENTIN 400 MG PO CAPS
400.0000 mg | ORAL_CAPSULE | Freq: Four times a day (QID) | ORAL | Status: DC
  Administered 2019-02-03: 400 mg via ORAL
  Filled 2019-02-03: qty 1

## 2019-02-03 MED ORDER — ARIPIPRAZOLE 5 MG PO TABS
15.0000 mg | ORAL_TABLET | Freq: Every day | ORAL | Status: DC
  Administered 2019-02-04: 15 mg via ORAL
  Filled 2019-02-03: qty 1

## 2019-02-03 MED ORDER — BUTALBITAL-APAP-CAFFEINE 50-325-40 MG PO TABS
2.0000 | ORAL_TABLET | Freq: Once | ORAL | Status: AC
  Administered 2019-02-03: 2 via ORAL
  Filled 2019-02-03: qty 2

## 2019-02-03 MED ORDER — BUPROPION HCL ER (SR) 100 MG PO TB12
100.0000 mg | ORAL_TABLET | Freq: Two times a day (BID) | ORAL | Status: DC
  Administered 2019-02-03: 11:00:00 100 mg via ORAL
  Filled 2019-02-03: qty 1

## 2019-02-03 MED ORDER — CLONIDINE HCL 0.1 MG PO TABS: 0.1000 mg | ORAL_TABLET | Freq: Every day | ORAL | Status: DC

## 2019-02-03 MED ORDER — ARIPIPRAZOLE 5 MG PO TABS
15.0000 mg | ORAL_TABLET | Freq: Every day | ORAL | Status: DC
  Administered 2019-02-03: 15 mg via ORAL
  Filled 2019-02-03: qty 1

## 2019-02-03 MED ORDER — DICYCLOMINE HCL 20 MG PO TABS: 20.0000 mg | ORAL_TABLET | Freq: Four times a day (QID) | ORAL | Status: DC | PRN

## 2019-02-03 MED ORDER — ONDANSETRON 4 MG PO TBDP
8.0000 mg | ORAL_TABLET | Freq: Once | ORAL | Status: AC
  Administered 2019-02-03: 08:00:00 8 mg via ORAL
  Filled 2019-02-03: qty 2

## 2019-02-03 MED ORDER — CLONIDINE HCL 0.1 MG PO TABS: 0.1000 mg | ORAL_TABLET | Freq: Two times a day (BID) | ORAL | Status: DC

## 2019-02-03 MED ORDER — BUPROPION HCL ER (SR) 100 MG PO TB12
100.0000 mg | ORAL_TABLET | Freq: Two times a day (BID) | ORAL | Status: DC
  Administered 2019-02-03 – 2019-02-04 (×2): 100 mg via ORAL
  Filled 2019-02-03 (×2): qty 1

## 2019-02-03 MED ORDER — QUETIAPINE FUMARATE 100 MG PO TABS
100.0000 mg | ORAL_TABLET | Freq: Two times a day (BID) | ORAL | Status: DC
  Administered 2019-02-03 – 2019-02-04 (×2): 100 mg via ORAL
  Filled 2019-02-03 (×2): qty 1

## 2019-02-03 MED ORDER — ONDANSETRON 4 MG PO TBDP: 4.0000 mg | ORAL_TABLET | Freq: Four times a day (QID) | ORAL | Status: DC | PRN

## 2019-02-03 MED ORDER — METHOCARBAMOL 500 MG PO TABS: 500.0000 mg | ORAL_TABLET | Freq: Three times a day (TID) | ORAL | Status: DC | PRN
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
## 2019-02-03 NOTE — ED Notes (Signed)
Bed: WTR5 Expected date:  Expected time:  Means of arrival:  Comments: 

## 2019-02-03 NOTE — ED Notes (Signed)
Patient verbalized understanding of discharge instructions and follow-up information ([facility] and [facility] contacts provided). He denies any further needs or questions at this time. VS stable. Patient ambulatory with steady gait.

## 2019-02-03 NOTE — ED Provider Notes (Signed)
[facility] Provider Note   CSN: [id number] Arrival date & time: 02/03/19  [id number]    History   Chief Complaint Chief Complaint  Patient presents with  . Suicidal  . Abdominal Pain    HPI Cole Morrison is a 33 y.o. male.     Patient presents to the emergency department with a chief complaint of suicidal ideation.  He states that he is depressed and has nothing to live for, and therefore wants to kill himself.  He states that he wants to take 60,000 mg of phenobarbital.  He also considers running out into traffic.  He denies any intention to hurt anyone else.  Denies any auditory or visual hallucinations.  He has been using meth and heroin tonight.  Denies any alcohol use.  He also reports having persistent epigastric abdominal pain, and has prior history remarkable for acid reflux and gastritis.  The symptoms have been ongoing for the past 3 to 4 days.  He denies any fever, chills, nausea, vomiting, or diarrhea.  Denies any other associated symptoms.  The history is provided by the patient. No language interpreter was used.    Past Medical History:  Diagnosis Date  . Acid reflux   . Arthritis   . Bipolar 1 disorder (HCC)   . Diabetes mellitus without complication (HCC) Borderline  . Gastritis   . 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 Overactive bladder   . PTSD (post-traumatic stress disorder)     Patient Active Problem List   Diagnosis Date Noted  . Tobacco use disorder 07/22/2016  . Opioid use disorder, moderate, dependence (HCC) 01/16/2014  . Alcohol use disorder, moderate, dependence (HCC) 01/16/2014  . Bipolar disorder current episode depressed (HCC) 01/16/2014  . Substance induced mood disorder (HCC) 01/15/2014    Past Surgical History:  Procedure Laterality Date  . WISDOM TOOTH EXTRACTION          Home Medications    Prior to Admission medications   Medication Sig Start Date End Date Taking? Authorizing Provider   naloxone HCl 4 MG/0.1ML LIQD Use as directed for overdose 07/24/16   Pucilowska, Ellin GoodieJolanta B, MD    Family History Family History  Problem Relation Age of Onset  . Hypertension Mother   . Hypertension Father     Social History Social History   Tobacco Use  . Smoking status: Current Every Day Smoker    Packs/day: 0.50    Types: Cigarettes  . Smokeless tobacco: Never Used  Substance Use Topics  . Alcohol use: No  . Drug use: Yes    Types: Marijuana, Oxycodone, Heroin, IV, Methamphetamines    Comment: Heroin     Allergies   Ibuprofen   Review of Systems Review of Systems  All other systems reviewed and are negative.    Physical Exam Updated Vital Signs BP 118/87   Pulse 99   Temp 98.4 F (36.9 C) (Oral)   Resp (!) 22   Wt 65.3 kg   SpO2 97%   BMI 21.89 kg/m   Physical Exam Vitals signs and nursing note reviewed.  Constitutional:      General: He is not in acute distress.    Appearance: Normal appearance. He is well-developed. He is not ill-appearing, toxic-appearing or diaphoretic.  HENT:     Head: Normocephalic and atraumatic.  Eyes:     Conjunctiva/sclera: Conjunctivae normal.  Neck:     Musculoskeletal: Neck supple.  Cardiovascular:     Rate and  Rhythm: Normal rate and regular rhythm.     Heart sounds: No murmur.  Pulmonary:     Effort: Pulmonary effort is normal. No respiratory distress.     Breath sounds: Normal breath sounds.  Abdominal:     General: There is no distension.     Palpations: Abdomen is soft.     Tenderness: There is no abdominal tenderness. There is no guarding.     Comments: No focal abdominal tenderness, no RLQ tenderness or pain at McBurney's point, no RUQ tenderness or Murphy's sign, no left-sided abdominal tenderness, no fluid wave, or signs of peritonitis   Musculoskeletal: Normal range of motion.  Skin:    General: Skin is warm and dry.     Findings: No rash.  Neurological:     Mental Status: He is alert.   Psychiatric:        Mood and Affect: Mood normal.        Behavior: Behavior normal.        Thought Content: Thought content normal.        Judgment: Judgment normal.      ED Treatments / Results  Labs (all labs ordered are listed, but only abnormal results are displayed) Labs Reviewed  CBC WITH DIFFERENTIAL/PLATELET  COMPREHENSIVE METABOLIC PANEL  ETHANOL  SALICYLATE LEVEL  ACETAMINOPHEN LEVEL  RAPID URINE DRUG SCREEN, HOSP PERFORMED    EKG EKG Interpretation  Date/Time:  Friday February 03 2019 05:15:17 EDT Ventricular Rate:  98 PR Interval:    QRS Duration: 81 QT Interval:  325 QTC Calculation: 415 R Axis:   79 Text Interpretation:  Sinus rhythm Biatrial enlargement No previous ECGs available Confirmed by Glynn Octave [phone]) on 02/03/2019 5:30:18 AM   Radiology No results found.  Procedures Procedures (including critical care time)  Medications Ordered in ED Medications - No data to display   Initial Impression / Assessment and Plan / ED Course  I have reviewed the triage vital signs and the nursing notes.  Pertinent labs & imaging results that were available during my care of the patient were reviewed by me and considered in my medical decision making (see chart for details).       Patient with suicidal thoughts.  States that he no longer has a reason to live and wants to die.  Has been using heroin and meth.  Denies any alcohol use.  Has had some epigastric abdominal pain x 3 days, but no fever.  No RUQ tenderness.  Doubt surgical or acute abdomen.    Medically clear for TTS evaluation.  Final Clinical Impressions(s) / ED Diagnoses   Final diagnoses:  None    ED Discharge Orders    None       Roxy Horseman, PA-C 02/03/19 [id number]    Glynn Octave, MD 02/03/19 [phone]

## 2019-02-03 NOTE — BHH Counselor (Signed)
Pt denies, having family, friend supports clinician can contact.     Redmond Pulling, MS, [facility], [facility] Triage Specialist [phone]

## 2019-02-03 NOTE — ED Provider Notes (Signed)
[facility]-EMERGENCY DEPT Provider Note   CSN: [id number] Arrival date & time: 02/03/19  2011    History   Chief Complaint Chief Complaint  Patient presents with  . Suicidal    HPI Rodman CompWestley Rehman is a 33 y.o. male.     HPI  33 year old male presents with suicidal thoughts.  He states he has been depressed and anxious and suicidal for a long time.  He cannot tell me exactly why he is feeling worse currently.  He was at [facility] last night/today and was discharged this afternoon.  He states he was tired of being there and they were treating him poorly.  He did not like the staff or the facility.  He states he lied and told the psychiatry consult that he was not suicidal but he still is.  He called the police because he was feeling like he wanted to jump off a bridge.  They then brought him here.  He denies any medical illness or complaint such as fevers, cough, abdominal or chest pain.  Past Medical History:  Diagnosis Date  . Acid reflux   . Arthritis   . Bipolar 1 disorder (HCC)   . Diabetes mellitus without complication (HCC) Borderline  . Gastritis   . 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 Overactive bladder   . PTSD (post-traumatic stress disorder)     Patient Active Problem List   Diagnosis Date Noted  . Tobacco use disorder 07/22/2016  . Opioid use disorder, moderate, dependence (HCC) 01/16/2014  . Alcohol use disorder, moderate, dependence (HCC) 01/16/2014  . Bipolar disorder current episode depressed (HCC) 01/16/2014  . Substance induced mood disorder (HCC) 01/15/2014    Past Surgical History:  Procedure Laterality Date  . WISDOM TOOTH EXTRACTION          Home Medications    Prior to Admission medications   Medication Sig Start Date End Date Taking? Authorizing Provider  ARIPiprazole (ABILIFY) 15 MG tablet Take 15 mg by mouth daily.    [provider]  buprenorphine-naloxone (SUBOXONE) 8-2 mg SUBL SL tablet Place 1 tablet  under the tongue daily.    [provider]  buPROPion (WELLBUTRIN SR) 100 MG 12 hr tablet Take 100 mg by mouth 2 (two) times daily.    [provider]  gabapentin (NEURONTIN) 400 MG capsule Take 400 mg by mouth 4 (four) times daily.    [provider]  naloxone HCl 4 MG/0.1ML LIQD Use as directed for overdose Patient taking differently: Place 1 spray into the nose once. as directed for overdose 07/24/16   Pucilowska, Jolanta B, MD  QUEtiapine (SEROQUEL) 100 MG tablet Take 100 mg by mouth 2 (two) times daily.    [provider]    Family History Family History  Problem Relation Age of Onset  . Hypertension Mother   . Hypertension Father     Social History Social History   Tobacco Use  . Smoking status: Current Every Day Smoker    Packs/day: 0.50    Types: Cigarettes  . Smokeless tobacco: Never Used  Substance Use Topics  . Alcohol use: No  . Drug use: Yes    Types: Marijuana, Oxycodone, Heroin, IV, Methamphetamines    Comment: Heroin     Allergies   Haldol [haloperidol] and Ibuprofen   Review of Systems Review of Systems  Constitutional: Negative for fever.  Respiratory: Negative for cough and shortness of breath.   Cardiovascular: Negative for chest pain.  Gastrointestinal: Negative for abdominal pain.  Neurological: Negative for headaches.  Psychiatric/Behavioral: Positive for suicidal ideas.  All other systems reviewed and are negative.    Physical Exam Updated Vital Signs BP 122/71 (BP Location: Left Arm)   Pulse 90   Temp 97.9 F (36.6 C) (Oral)   Resp 16   SpO2 99%   Physical Exam Vitals signs and nursing note reviewed.  Constitutional:      Appearance: He is well-developed.  HENT:     Head: Normocephalic and atraumatic.     Right Ear: External ear normal.     Left Ear: External ear normal.     Nose: Nose normal.  Eyes:     General:        Right eye: No discharge.        Left eye: No discharge.  Neck:      Musculoskeletal: Neck supple.  Cardiovascular:     Rate and Rhythm: Normal rate and regular rhythm.     Heart sounds: Normal heart sounds.  Pulmonary:     Effort: Pulmonary effort is normal.     Breath sounds: Normal breath sounds.  Abdominal:     General: There is no distension.     Palpations: Abdomen is soft.     Tenderness: There is no abdominal tenderness.  Skin:    General: Skin is warm and dry.  Neurological:     Mental Status: He is alert.  Psychiatric:        Mood and Affect: Mood is not anxious.        Thought Content: Thought content includes suicidal ideation. Thought content includes suicidal plan.      ED Treatments / Results  Labs (all labs ordered are listed, but only abnormal results are displayed) Labs Reviewed - No data to display  EKG None  Radiology No results found.  Procedures Procedures (including critical care time)  Medications Ordered in ED Medications  acetaminophen (TYLENOL) tablet 650 mg (has no administration in time range)  ARIPiprazole (ABILIFY) tablet 15 mg (has no administration in time range)  buPROPion (WELLBUTRIN SR) 12 hr tablet 100 mg (has no administration in time range)  gabapentin (NEURONTIN) capsule 400 mg (has no administration in time range)  QUEtiapine (SEROQUEL) tablet 100 mg (has no administration in time range)     Initial Impression / Assessment and Plan / ED Course  I have reviewed the triage vital signs and the nursing notes.  Pertinent labs & imaging results that were available during my care of the patient were reviewed by me and considered in my medical decision making (see chart for details).        Patient had screening labs at 5:30 AM. Given benign VS and no concerning findings on exam/history, I don't think labs need to be repeated. He is not intoxicated. TTS consulted and recommends overnight observation and reassessment in AM.  Final Clinical Impressions(s) / ED Diagnoses   Final diagnoses:  None     ED Discharge Orders    None       Pricilla Loveless, MD 02/03/19 2231

## 2019-02-03 NOTE — ED Triage Notes (Signed)
Pt states he's suicidal and is going to jump off a bridge, he states that he doesn't have any family or friends or dependents and doesn't have anything to live for. Pt called [employer] himself and is currently voluntary Pt does state that this is a waste of time and he wont call again

## 2019-02-03 NOTE — Discharge Instructions (Signed)
You were seen in the emergency department for substance abuse and suicidal thoughts.  You were evaluated by mental health and they did not feel you needed to be hospitalized.  They recommend you follow-up with [facility] at [address] phone number [phone] regarding housing.  They also recommend you follow-up with [facility] at the number above for help with substance abuse.

## 2019-02-03 NOTE — ED Notes (Addendum)
Patient reports taking daily medications such as seroquel and requests to get them ordered; however, no daily medications found in chart or in fill history per pharmacy. Pharmacy tech to re-evaluate.

## 2019-02-03 NOTE — Consult Note (Signed)
Telepsych Consultation   Reason for Consult:  Reported SI Referring Physician:  EDP Location of Patient:  Location of Provider: Behavioral Health TTS Department  Patient Identification: Cole Morrison MRN:  [id number] Principal Diagnosis: <principal problem not specified> Diagnosis:  Active Problems:   * No active hospital problems. *   Total Time spent with patient: 30 minutes  Subjective:   Cole Morrison is a 33 y.o. male patient reports today that he does no longer have any suicidal or homicidal ideations and he denies any hallucinations.  Patient reports that he came in yesterday because he was feeling overwhelmed.  He states that he had a migraine and states that he had not been sleeping well.  He reports that he came to the hospital and reported suicidal ideations in the hospital to help take care of him.  He states that the medications help with his migraine and he slept well last night and he was able to eat.  Patient reports today that he feels that he is ready to go and that he plans to [facility] and agrees to follow-up with [facility] for outpatient resources.  He does request to have this information sent to him as he states it has been a while since he has been to either 1 of these locations.  HPI: EDP note 02/03/2019:Patient presents to the emergency department with a chief complaint of suicidal ideation.  He states that he is depressed and has nothing to live for, and therefore wants to kill himself.  He states that he wants to take 60,000 mg of phenobarbital.  He also considers running out into traffic.  He denies any intention to hurt anyone else.  Denies any auditory or visual hallucinations.  He has been using meth and heroin tonight.  Denies any alcohol use.  He also reports having persistent epigastric abdominal pain, and has prior history remarkable for acid reflux and gastritis.  The symptoms have been ongoing for the past 3 to 4 days.  He denies any  fever, chills, nausea, vomiting, or diarrhea.  Denies any other associated symptoms.  On evaluation today: Patient is seen by me via tele-psych and I have consulted with Dr. Lucianne MussKumar.  Patient presents in the ED.  Patient is alert and awake and is very talkative.  Patient does not appear depressed or responding to any internal or external stimuli.  Patient has a long history of substance abuse as well as ED visits.  It is been reported the patient has been in [place] for the last 5 years and per the chart shows the same.  Patient had numerous visits to the ED 3 and 5 years ago.  At this time the patient is denying any suicidal or homicidal ideations and denies any hallucinations.  Patient is future oriented and has made plans to follow-up with outpatient resources which information will be provided.  At this time patient does not meet inpatient criteria and is psychiatrically cleared.  I have contacted Dr. Charm BargesButler and notified him of the recommendations  Past Psychiatric History: Polysubstance abuse, MDD, bipolar disorder, substance abuse induced mood disorder  Risk to Self: Suicidal Ideation: No-Not Currently/Within Last 6 Months Suicidal Intent: No-Not Currently/Within Last 6 Months Is patient at risk for suicide?: Yes Suicidal Plan?: No What has been your use of drugs/alcohol within the last 12 months?: daily Other Self Harm Risks: psychiatric dx, lives alone, financial stress,  Intentional Self Injurious Behavior: (reusing old needles) Risk to Others: Homicidal Ideation: No-Not Currently/Within  Last 6 Months Thoughts of Harm to Others: No Current Homicidal Intent: No Current Homicidal Plan: No History of harm to others?: Yes Assessment of Violence: In distant past Does patient have access to weapons?: No Criminal Charges Pending?: No Does patient have a court date: No Prior Inpatient Therapy: Prior Inpatient Therapy: Yes Prior Therapy Dates: 01/22/19-01/30/19 Prior Therapy Facilty/Provider(s):  [facility] Reason for Treatment: Depression & nutrition Prior Outpatient Therapy: Prior Outpatient Therapy: Yes Prior Therapy Facilty/Provider(s): [facility], [place] Reason for Treatment: Depression Does patient have an ACCT team?: No Does patient have Intensive In-House Services?  : No Does patient have [facility] services? : No(in past) Does patient have [facility] services?: No  Past Medical History:  Past Medical History:  Diagnosis Date  . Acid reflux   . Arthritis   . Bipolar 1 disorder (HCC)   . Diabetes mellitus without complication (HCC) Borderline  . Gastritis   . 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 Overactive bladder   . PTSD (post-traumatic stress disorder)     Past Surgical History:  Procedure Laterality Date  . WISDOM TOOTH EXTRACTION     Family History:  Family History  Problem Relation Age of Onset  . Hypertension Mother   . Hypertension Father    Family Psychiatric  History: Denies Social History:  Social History   Substance and Sexual Activity  Alcohol Use No     Social History   Substance and Sexual Activity  Drug Use Yes  . Types: Marijuana, Oxycodone, Heroin, IV, Methamphetamines   Comment: Heroin    Social History   Socioeconomic History  . Marital status: Single    Spouse name: Not on file  . Number of children: Not on file  . Years of education: Not on file  . Highest education level: Not on file  Occupational History  . Not on file  Social Needs  . Financial resource strain: Not on file  . Food insecurity:    Worry: Not on file    Inability: Not on file  . Transportation needs:    Medical: Not on file    Non-medical: Not on file  Tobacco Use  . Smoking status: Current Every Day Smoker    Packs/day: 0.50    Types: Cigarettes  . Smokeless tobacco: Never Used  Substance and Sexual Activity  . Alcohol use: No  . Drug use: Yes    Types: Marijuana, Oxycodone, Heroin, IV, Methamphetamines    Comment: Heroin  . Sexual  activity: Not on file  Lifestyle  . Physical activity:    Days per week: Not on file    Minutes per session: Not on file  . Stress: Not on file  Relationships  . Social connections:    Talks on phone: Not on file    Gets together: Not on file    Attends religious service: Not on file    Active member of club or organization: Not on file    Attends meetings of clubs or organizations: Not on file    Relationship status: Not on file  Other Topics Concern  . Not on file  Social History Narrative  . Not on file   Additional Social History:    Allergies:   Allergies  Allergen Reactions  . Haldol [Haloperidol] Anaphylaxis  . Ibuprofen Anaphylaxis, Hives, Nausea And Vomiting and Swelling    Labs:  Results for orders placed or performed during the hospital encounter of 02/03/19 (from the past 48 hour(s))  CBC with  Differential/Platelet     Status: Abnormal   Collection Time: 02/03/19  5:29 AM  Result Value Ref Range   WBC 14.1 (H) 4.0 - 10.5 K/uL   RBC 4.67 4.22 - 5.81 MIL/uL   Hemoglobin 14.0 13.0 - 17.0 g/dL   HCT 14.7 82.9 - 56.2 %   MCV 91.0 80.0 - 100.0 fL   MCH 30.0 26.0 - 34.0 pg   MCHC 32.9 30.0 - 36.0 g/dL   RDW 13.0 86.5 - 78.4 %   Platelets 161 150 - 400 K/uL   nRBC 0.0 0.0 - 0.2 %   Neutrophils Relative % 65 %   Neutro Abs 9.1 (H) 1.7 - 7.7 K/uL   Lymphocytes Relative 25 %   Lymphs Abs 3.6 0.7 - 4.0 K/uL   Monocytes Relative 8 %   Monocytes Absolute 1.1 (H) 0.1 - 1.0 K/uL   Eosinophils Relative 2 %   Eosinophils Absolute 0.3 0.0 - 0.5 K/uL   Basophils Relative 0 %   Basophils Absolute 0.0 0.0 - 0.1 K/uL   Immature Granulocytes 0 %   Abs Immature Granulocytes 0.06 0.00 - 0.07 K/uL    Comment: Performed at [facility], [address], [place], [place] [address]  Comprehensive metabolic panel     Status: Abnormal   Collection Time: 02/03/19  5:29 AM  Result Value Ref Range   Sodium 137 135 - 145 mmol/L   Potassium 3.5 3.5 - 5.1 mmol/L   Chloride 98  98 - 111 mmol/L   CO2 33 (H) 22 - 32 mmol/L   Glucose, Bld 85 70 - 99 mg/dL   BUN 12 6 - 20 mg/dL   Creatinine, Ser 5.28 0.61 - 1.24 mg/dL   Calcium 9.5 8.9 - 41.3 mg/dL   Total Protein 7.5 6.5 - 8.1 g/dL   Albumin 4.3 3.5 - 5.0 g/dL   AST 24 15 - 41 U/L   ALT 22 0 - 44 U/L   Alkaline Phosphatase 68 38 - 126 U/L   Total Bilirubin 0.8 0.3 - 1.2 mg/dL   GFR calc non Af Amer >60 >60 mL/min   GFR calc Af Amer >60 >60 mL/min   Anion gap 6 5 - 15    Comment: Performed at [facility], [address] [place], [place] [address]  Ethanol     Status: None   Collection Time: 02/03/19  5:29 AM  Result Value Ref Range   Alcohol, Ethyl (B) <10 <10 mg/dL    Comment: (NOTE) Lowest detectable limit for serum alcohol is 10 mg/dL. For medical purposes only. Performed at [facility], [address], [place], [place] [address]   Salicylate level     Status: None   Collection Time: 02/03/19  5:29 AM  Result Value Ref Range   Salicylate Lvl <7.0 2.8 - 30.0 mg/dL    Comment: Performed at [facility], [address], [place], [place] [address]  Acetaminophen level     Status: Abnormal   Collection Time: 02/03/19  5:29 AM  Result Value Ref Range   Acetaminophen (Tylenol), Serum <10 (L) 10 - 30 ug/mL    Comment: (NOTE) Therapeutic concentrations vary significantly. A range of 10-30 ug/mL  may be an effective concentration for many patients. However, some  are best treated at concentrations outside of this range. Acetaminophen concentrations >150 ug/mL at 4 hours after ingestion  and >50 ug/mL at 12 hours after ingestion are often associated with  toxic reactions.  Performed at [facility], [address], [place], [place] [address]   Rapid urine drug screen (hospital performed)     Status: Abnormal   Collection Time: 02/03/19  5:47 AM  Result Value Ref Range   Opiates POSITIVE (A) NONE DETECTED   Cocaine NONE DETECTED NONE DETECTED   Benzodiazepines NONE DETECTED  NONE DETECTED   Amphetamines POSITIVE (A) NONE DETECTED   Tetrahydrocannabinol POSITIVE (A) NONE DETECTED   Barbiturates NONE DETECTED NONE DETECTED    Comment: (NOTE) DRUG SCREEN FOR MEDICAL PURPOSES ONLY.  IF CONFIRMATION IS NEEDED FOR ANY PURPOSE, NOTIFY LAB WITHIN 5 DAYS. LOWEST DETECTABLE LIMITS FOR URINE DRUG SCREEN Drug Class                     Cutoff (ng/mL) Amphetamine and metabolites    1000 Barbiturate and metabolites    200 Benzodiazepine                 200 Tricyclics and metabolites     300 Opiates and metabolites        300 Cocaine and metabolites        300 THC                            50 Performed at [facility], [address] [place], [place] [address]     Medications:  Current Facility-Administered Medications  Medication Dose Route Frequency Provider Last Rate Last Dose  . ARIPiprazole (ABILIFY) tablet 15 mg  15 mg Oral Daily Terrilee Files, MD   15 mg at 02/03/19 1042  . buPROPion (WELLBUTRIN SR) 12 hr tablet 100 mg  100 mg Oral BID Terrilee Files, MD   100 mg at 02/03/19 1042  . cloNIDine (CATAPRES) tablet 0.1 mg  0.1 mg Oral QID Terrilee Files, MD       Followed by  . [START ON 02/05/2019] cloNIDine (CATAPRES) tablet 0.1 mg  0.1 mg Oral BID Terrilee Files, MD       Followed by  . [START ON 02/07/2019] cloNIDine (CATAPRES) tablet 0.1 mg  0.1 mg Oral Daily Terrilee Files, MD      . dicyclomine (BENTYL) tablet 20 mg  20 mg Oral Q6H PRN Terrilee Files, MD      . gabapentin (NEURONTIN) capsule 400 mg  400 mg Oral QID Terrilee Files, MD   400 mg at 02/03/19 1042  . hydrOXYzine (ATARAX/VISTARIL) tablet 25 mg  25 mg Oral Q6H PRN Terrilee Files, MD      . loperamide (IMODIUM) capsule 2-4 mg  2-4 mg Oral PRN Terrilee Files, MD      . methocarbamol (ROBAXIN) tablet 500 mg  500 mg Oral Q8H PRN Terrilee Files, MD      . ondansetron (ZOFRAN-ODT) disintegrating tablet 4 mg  4 mg Oral Q6H PRN Terrilee Files, MD      .  QUEtiapine (SEROQUEL) tablet 100 mg  100 mg Oral BID Terrilee Files, MD   100 mg at 02/03/19 1042   Current Outpatient Medications  Medication Sig Dispense Refill  . ARIPiprazole (ABILIFY) 15 MG tablet Take 15 mg by mouth daily.    Marland Kitchen buPROPion (WELLBUTRIN SR) 100 MG 12 hr tablet Take 100 mg by mouth 2 (two) times daily.    Marland Kitchen gabapentin (NEURONTIN) 400 MG capsule Take 400 mg by mouth 4 (four) times daily.    Marland Kitchen  naloxone HCl 4 MG/0.1ML LIQD Use as directed for overdose (Patient taking differently: Place 1 spray into the nose once. as directed for overdose) 1 each 1  . QUEtiapine (SEROQUEL) 100 MG tablet Take 100 mg by mouth 2 (two) times daily.    . buprenorphine-naloxone (SUBOXONE) 8-2 mg SUBL SL tablet Place 1 tablet under the tongue daily.      Musculoskeletal: Strength & Muscle Tone: within normal limits Gait & Station: normal Patient leans: N/A  Psychiatric Specialty Exam: Physical Exam  Nursing note and vitals reviewed. Constitutional: He is oriented to person, place, and time. He appears well-developed and well-nourished.  Cardiovascular: Normal rate.  Respiratory: Effort normal.  Musculoskeletal: Normal range of motion.  Neurological: He is alert and oriented to person, place, and time.  Skin: Skin is warm.    Review of Systems  Constitutional: Negative.   HENT: Negative.   Eyes: Negative.   Respiratory: Negative.   Cardiovascular: Negative.   Gastrointestinal: Negative.   Genitourinary: Negative.   Musculoskeletal: Negative.   Skin: Negative.   Neurological: Negative.   Endo/Heme/Allergies: Negative.   Psychiatric/Behavioral: Positive for substance abuse.    Blood pressure 108/72, pulse 88, temperature 98.4 F (36.9 C), temperature source Oral, resp. rate 20, weight 65.3 kg, SpO2 95 %.Body mass index is 21.89 kg/m.  General Appearance: Casual  Eye Contact:  Good  Speech:  Clear and Coherent and Normal Rate  Volume:  Normal  Mood:  Euthymic  Affect:  Congruent   Thought Process:  Coherent and Descriptions of Associations: Intact  Orientation:  Full (Time, Place, and Person)  Thought Content:  WDL  Suicidal Thoughts:  No  Homicidal Thoughts:  No  Memory:  Immediate;   Good Recent;   Good Remote;   Good  Judgement:  Fair  Insight:  Good  Psychomotor Activity:  Normal  Concentration:  Concentration: Good and Attention Span: Good  Recall:  Good  Fund of Knowledge:  Good  Language:  Good  Akathisia:  No  Handed:  Right  AIMS (if indicated):     Assets:  Communication Skills Desire for Improvement Financial Resources/Insurance Housing Physical Health Social Support Transportation  ADL's:  Intact  Cognition:  WNL  Sleep:        Treatment Plan Summary: Follow up with [facility]  Follow up with [facility] Continued prescribed medications  Disposition: No evidence of imminent risk to self or others at present.   Patient does not meet criteria for psychiatric inpatient admission. Supportive therapy provided about ongoing stressors. Discussed crisis plan, support from social network, calling 911, coming to the Emergency Department, and calling Suicide Hotline.  This service was provided via telemedicine using a 2-way, interactive audio and video technology.  Names of all persons participating in this telemedicine service and their role in this encounter. Name: Cole Comp Role: Patient  Name: Feliz Beam Madison Albea NP Role: Provider  Name:  Role:   Name:  Role:     Maryfrances Bunnell, FNP 02/03/2019 10:57 AM

## 2019-02-03 NOTE — ED Triage Notes (Signed)
Pt in via [facility] with SI and gen abdominal pain. Pt states he has nothing to live for, shot up meth and heroine around 11p last night, also planned to run into traffic. Endorses abd pain x 3 days, has not eaten for same. Hx of DM, CBG 101. GCS 15, a&ox4

## 2019-02-03 NOTE — ED Notes (Signed)
Patient provided sandwich and gingerale.

## 2019-02-03 NOTE — ED Provider Notes (Signed)
Patient signed out to me from Dr. Manus Gunning.  33 year old male polysubstance abuse here with suicidal ideation. Physical Exam  BP 108/72 (BP Location: Right Arm)   Pulse 88   Temp 98.4 F (36.9 C) (Oral)   Resp 20   Wt 65.3 kg   SpO2 95%   BMI 21.89 kg/m   Physical Exam  ED Course/Procedures     Procedures  MDM  Just received a call from [place] from [facility].  They did not feel he needed acute hospitalization and are giving him a list of resources for outpatient assistance.       Terrilee Files, MD 02/03/19 [phone]

## 2019-02-03 NOTE — ED Notes (Signed)
Breakfast ordered 
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
## 2019-02-03 NOTE — ED Notes (Signed)
Belongings bag placed in locker 3.

## 2019-02-03 NOTE — ED Triage Notes (Signed)
Pt was seen at [facility] earlier and was cleared with outpatient follow up at 1530pm

## 2019-02-03 NOTE — ED Notes (Signed)
Bed: [facility] Expected date:  Expected time:  Means of arrival:  Comments: 

## 2019-02-04 MED ORDER — BUTALBITAL-APAP-CAFFEINE 50-325-40 MG PO TABS
1.0000 | ORAL_TABLET | Freq: Once | ORAL | Status: AC
  Administered 2019-02-04: 1 via ORAL
  Filled 2019-02-04: qty 1
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
## 2019-02-04 NOTE — Progress Notes (Signed)
This patient continues to meet inpatient criteria. CSW faxed information to the following facilities:   [facility] [place] [facility]  Enid Cutter, [place] Clinical Social Worker [phone]

## 2019-02-04 NOTE — ED Notes (Signed)
Patient is requesting something for his headache. Patient is refusing tylenol.

## 2019-02-04 NOTE — ED Notes (Signed)
Pt ambulatory to the bathroom 

## 2019-02-04 NOTE — ED Notes (Signed)
Placed telepsych monitor at the bedside for pt assessment

## 2019-02-05 ENCOUNTER — Encounter (HOSPITAL_COMMUNITY): Payer: Self-pay | Admitting: Emergency Medicine

## 2019-02-05 ENCOUNTER — Other Ambulatory Visit: Payer: Self-pay

## 2019-02-05 ENCOUNTER — Emergency Department (HOSPITAL_COMMUNITY)
Admission: EM | Admit: 2019-02-05 | Discharge: 2019-02-07 | Disposition: A | Discharge status: Home / Self Care | Payer: Medicaid - Out of State | Attending: Emergency Medicine | Admitting: Emergency Medicine

## 2019-02-05 DIAGNOSIS — E119 Type 2 diabetes mellitus without complications: Secondary | ICD-10-CM | POA: Diagnosis not present

## 2019-02-05 DIAGNOSIS — F1910 Other psychoactive substance abuse, uncomplicated: Secondary | ICD-10-CM

## 2019-02-05 DIAGNOSIS — Z79899 Other long term (current) drug therapy: Secondary | ICD-10-CM | POA: Diagnosis not present

## 2019-02-05 DIAGNOSIS — T404X2A Poisoning by other synthetic narcotics, intentional self-harm, initial encounter: Secondary | ICD-10-CM | POA: Diagnosis not present

## 2019-02-05 DIAGNOSIS — F17210 Nicotine dependence, cigarettes, uncomplicated: Secondary | ICD-10-CM | POA: Diagnosis not present

## 2019-02-05 DIAGNOSIS — F314 Bipolar disorder, current episode depressed, severe, without psychotic features: Secondary | ICD-10-CM | POA: Insufficient documentation

## 2019-02-05 DIAGNOSIS — F1994 Other psychoactive substance use, unspecified with psychoactive substance-induced mood disorder: Secondary | ICD-10-CM | POA: Diagnosis present

## 2019-02-05 DIAGNOSIS — R45851 Suicidal ideations: Secondary | ICD-10-CM | POA: Insufficient documentation

## 2019-02-05 DIAGNOSIS — T40412A Poisoning by fentanyl or fentanyl analogs, intentional self-harm, initial encounter: Secondary | ICD-10-CM

## 2019-02-05 LAB — CBC
HCT: 43.5 % (ref 39.0–52.0)
Hemoglobin: 14 g/dL (ref 13.0–17.0)
MCH: 29.5 pg (ref 26.0–34.0)
MCHC: 32.2 g/dL (ref 30.0–36.0)
MCV: 91.8 fL (ref 80.0–100.0)
Platelets: 172 10*3/uL (ref 150–400)
RBC: 4.74 MIL/uL (ref 4.22–5.81)
RDW: 12.1 % (ref 11.5–15.5)
WBC: 9.8 10*3/uL (ref 4.0–10.5)
nRBC: 0 % (ref 0.0–0.2)

## 2019-02-05 LAB — COMPREHENSIVE METABOLIC PANEL
ALT: 21 U/L (ref 0–44)
AST: 26 U/L (ref 15–41)
Albumin: 4.2 g/dL (ref 3.5–5.0)
Alkaline Phosphatase: 80 U/L (ref 38–126)
Anion gap: 6 (ref 5–15)
BUN: 10 mg/dL (ref 6–20)
CO2: 29 mmol/L (ref 22–32)
Calcium: 9.2 mg/dL (ref 8.9–10.3)
Chloride: 101 mmol/L (ref 98–111)
Creatinine, Ser: 1.16 mg/dL (ref 0.61–1.24)
GFR calc Af Amer: >60 mL/min (ref >60)
GFR calc non Af Amer: >60 mL/min (ref >60)
Glucose, Bld: 64 mg/dL — ABNORMAL LOW (ref 70–99)
Potassium: 3.5 mmol/L (ref 3.5–5.1)
Sodium: 136 mmol/L (ref 135–145)
Total Bilirubin: 0.6 mg/dL (ref 0.3–1.2)
Total Protein: 7.2 g/dL (ref 6.5–8.1)

## 2019-02-05 LAB — RAPID URINE DRUG SCREEN, HOSP PERFORMED
Amphetamines: NOT DETECTED
Barbiturates: POSITIVE — AB
Benzodiazepines: NOT DETECTED
Cocaine: POSITIVE — AB
Opiates: POSITIVE — AB
Tetrahydrocannabinol: NOT DETECTED

## 2019-02-05 LAB — SALICYLATE LEVEL: Salicylate Lvl: <7 mg/dL (ref 2.8–30.0)

## 2019-02-05 LAB — CBG MONITORING, ED: Glucose-Capillary: 119 mg/dL — ABNORMAL HIGH (ref 70–99)

## 2019-02-05 LAB — ACETAMINOPHEN LEVEL: Acetaminophen (Tylenol), Serum: <10 ug/mL — ABNORMAL LOW (ref 10–30)

## 2019-02-05 LAB — ETHANOL: Alcohol, Ethyl (B): <10 mg/dL (ref <10)

## 2019-02-05 MED ORDER — QUETIAPINE FUMARATE 50 MG PO TABS
50.0000 mg | ORAL_TABLET | Freq: Two times a day (BID) | ORAL | Status: DC
  Administered 2019-02-06 – 2019-02-07 (×4): 50 mg via ORAL
  Filled 2019-02-05 (×4): qty 1

## 2019-02-05 MED ORDER — LORAZEPAM 1 MG PO TABS: 0.0000 mg | ORAL_TABLET | Freq: Two times a day (BID) | ORAL | Status: DC

## 2019-02-05 MED ORDER — ONDANSETRON 4 MG PO TBDP
4.0000 mg | ORAL_TABLET | Freq: Once | ORAL | Status: AC
  Administered 2019-02-05: 4 mg via ORAL
  Filled 2019-02-05: qty 1

## 2019-02-05 MED ORDER — ACETAMINOPHEN 325 MG PO TABS
650.0000 mg | ORAL_TABLET | ORAL | Status: DC | PRN
  Administered 2019-02-07 (×2): 650 mg via ORAL
  Filled 2019-02-05 (×2): qty 2

## 2019-02-05 MED ORDER — LORAZEPAM 2 MG/ML IJ SOLN: 0.0000 mg | Freq: Four times a day (QID) | INTRAMUSCULAR | Status: DC

## 2019-02-05 MED ORDER — THIAMINE HCL 100 MG/ML IJ SOLN: 100.0000 mg | Freq: Every day | INTRAMUSCULAR | Status: DC

## 2019-02-05 MED ORDER — ACETAMINOPHEN 325 MG PO TABS
650.0000 mg | ORAL_TABLET | Freq: Once | ORAL | Status: AC
  Administered 2019-02-05: 650 mg via ORAL
  Filled 2019-02-05: qty 2

## 2019-02-05 MED ORDER — LORAZEPAM 1 MG PO TABS
0.0000 mg | ORAL_TABLET | Freq: Four times a day (QID) | ORAL | Status: DC
  Administered 2019-02-06 – 2019-02-07 (×3): 1 mg via ORAL
  Filled 2019-02-05 (×3): qty 1

## 2019-02-05 MED ORDER — LORAZEPAM 2 MG/ML IJ SOLN: 0.0000 mg | Freq: Two times a day (BID) | INTRAMUSCULAR | Status: DC

## 2019-02-05 MED ORDER — VITAMIN B-1 100 MG PO TABS
100.0000 mg | ORAL_TABLET | Freq: Every day | ORAL | Status: DC
  Administered 2019-02-06 – 2019-02-07 (×2): 100 mg via ORAL
  Filled 2019-02-05 (×2): qty 1

## 2019-02-05 MED ORDER — ONDANSETRON HCL 4 MG PO TABS: 4.0000 mg | ORAL_TABLET | Freq: Three times a day (TID) | ORAL | Status: DC | PRN
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
## 2019-02-05 NOTE — ED Notes (Signed)
Pt srill sleeping

## 2019-02-05 NOTE — ED Notes (Signed)
Pt getting tts 

## 2019-02-05 NOTE — ED Notes (Signed)
Ts has recommended in-patient admission  No beds at behavorial

## 2019-02-05 NOTE — ED Notes (Signed)
Pt sleeping. 

## 2019-02-05 NOTE — ED Notes (Signed)
Pt c/o severe migraine headache  Pt requesting another med instead of the tylenol ordered providor at the bedside

## 2019-02-05 NOTE — ED Triage Notes (Signed)
Pt BIB [facility] for a suicide attempt by overdose. EMS reports the pt was at [employer] when he took Fentanyl for an intentional overdose. EMS reports the pt was ambulatory on scene, alert & oriented, and pain free. EMS reports no complications from the overdose. EMS reports the pt is SI and HI but does not specifically list anyone he wants to harm. EMS reports vital signs stable as noted.   Pt reports he took 6g of Fentanyl on purpose to kill himself. Pt reports he has attempted multiple homeless shelters and none of them have any space available. Pt reports he just needs some place to stay and he is down on his luck finding anything.

## 2019-02-05 NOTE — ED Notes (Signed)
CBG 119 

## 2019-02-05 NOTE — ED Notes (Signed)
Pt getting into scrubs  Security to wand  Waiting on sitter to arrive

## 2019-02-05 NOTE — ED Provider Notes (Signed)
[facility] EMERGENCY DEPARTMENT Provider Note   CSN: [id number] Arrival date & time: 02/05/19  1804    History   Chief Complaint No chief complaint on file.   HPI Cole Morrison is a 33 y.o. male.     HPI 33 year old man with a past medical history of substance abuse, bipolar disorder presents to the emergency department for evaluation by EMS.  Patient was at [employer] when he states that he tried to overdose on fentanyl to kill himself.  Does not hear any voices.  Recently discharged from an outside hospital for similar suicidal ideation.  States that he delivered 6 kg of fentanyl into the city and then was rewarded with the fentanyl which he used to inject himself though he does not recall how much he used.  Patient denies any other substance abuse at this time or any other attempts to harm himself.  He initially stated that he had a fall while in [employer] though there was no report of a fall by EMS or from the people that called EMS from [employer].  Has not been taking his home medication.  States that he has lost the will to live because of his age. Past Medical History:  Diagnosis Date  . Acid reflux   . Arthritis   . Bipolar 1 disorder (HCC)   . Diabetes mellitus without complication (HCC) Borderline  . Gastritis   . 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 Overactive bladder   . PTSD (post-traumatic stress disorder)     Patient Active Problem List   Diagnosis Date Noted  . Tobacco use disorder 07/22/2016  . Opioid use disorder, moderate, dependence (HCC) 01/16/2014  . Alcohol use disorder, moderate, dependence (HCC) 01/16/2014  . Bipolar disorder current episode depressed (HCC) 01/16/2014  . Substance induced mood disorder (HCC) 01/15/2014    Past Surgical History:  Procedure Laterality Date  . WISDOM TOOTH EXTRACTION          Home Medications    Prior to Admission medications   Medication Sig Start Date End Date Taking? Authorizing  Provider  ARIPiprazole (ABILIFY) 15 MG tablet Take 15 mg by mouth daily.    [provider]  buprenorphine-naloxone (SUBOXONE) 8-2 mg SUBL SL tablet Place 1 tablet under the tongue daily.    [provider]  buPROPion (WELLBUTRIN SR) 100 MG 12 hr tablet Take 100 mg by mouth 2 (two) times daily.    [provider]  gabapentin (NEURONTIN) 400 MG capsule Take 400 mg by mouth 4 (four) times daily.    [provider]  naloxone HCl 4 MG/0.1ML LIQD Use as directed for overdose Patient taking differently: Place 1 spray into the nose once. as directed for overdose 07/24/16   Pucilowska, Jolanta B, MD  QUEtiapine (SEROQUEL) 100 MG tablet Take 100 mg by mouth 2 (two) times daily.    [provider]    Family History Family History  Problem Relation Age of Onset  . Hypertension Mother   . Hypertension Father     Social History Social History   Tobacco Use  . Smoking status: Current Every Day Smoker    Packs/day: 0.50    Types: Cigarettes  . Smokeless tobacco: Never Used  Substance Use Topics  . Alcohol use: No  . Drug use: Yes    Types: Marijuana, Oxycodone, Heroin, IV, Methamphetamines    Comment: Heroin     Allergies   Haldol [haloperidol] and Ibuprofen  Review of Systems Review of Systems  Constitutional: Negative for chills and fever.  HENT: Negative for sore throat.   Eyes: Negative for visual disturbance.  Respiratory: Negative for shortness of breath.   Cardiovascular: Negative for chest pain.  Gastrointestinal: Negative for abdominal pain and vomiting.  Genitourinary: Negative for dysuria and hematuria.  Musculoskeletal: Negative for arthralgias.  Skin: Negative for color change and rash.  Neurological: Negative for syncope and weakness.  Psychiatric/Behavioral: Positive for decreased concentration, self-injury and suicidal ideas.  All other systems reviewed and are negative.    Physical Exam Updated Vital Signs There  were no vitals taken for this visit.  Physical Exam Vitals signs and nursing note reviewed.  Constitutional:      Appearance: He is well-developed.  HENT:     Head: Normocephalic and atraumatic.     Nose: No congestion or rhinorrhea.     Mouth/Throat:     Pharynx: No oropharyngeal exudate or posterior oropharyngeal erythema.  Eyes:     Extraocular Movements: Extraocular movements intact.     Conjunctiva/sclera: Conjunctivae normal.     Pupils: Pupils are equal, round, and reactive to light.  Neck:     Musculoskeletal: Neck supple.  Cardiovascular:     Rate and Rhythm: Normal rate and regular rhythm.     Heart sounds: No murmur.  Pulmonary:     Effort: Pulmonary effort is normal. No respiratory distress.     Breath sounds: Normal breath sounds.  Abdominal:     Palpations: Abdomen is soft.     Tenderness: There is no abdominal tenderness.  Skin:    General: Skin is warm and dry.  Neurological:     Mental Status: He is alert.     Comments: Cranial nerves II through XII bilaterally intact.  Able to walk on his own.  Gait appears did not have ataxia.  5 out of 5 strength in bilateral upper and lower extremities bilaterally.  No loss of light touch sensation in the body.  GCS 15.  Pupils 2 mm equal round and reactive.      ED Treatments / Results  Labs (all labs ordered are listed, but only abnormal results are displayed) Labs Reviewed  COMPREHENSIVE METABOLIC PANEL  ETHANOL  SALICYLATE LEVEL  ACETAMINOPHEN LEVEL  CBC  RAPID URINE DRUG SCREEN, HOSP PERFORMED  CBG MONITORING, ED    EKG None  Radiology No results found.  Procedures Procedures (including critical care time)  Medications Ordered in ED Medications - No data to display   Initial Impression / Assessment and Plan / ED Course  I have reviewed the triage vital signs and the nursing notes.  Pertinent labs & imaging results that were available during my care of the patient were reviewed by me and  considered in my medical decision making (see chart for details).        Well-appearing hemodynamically stable 33 year old man presents to the emergency department for evaluation of suicide attempt by overdosing on fentanyl.  Patient history as noted above.  Appears to be intoxicated at this time.  Lab work unremarkable and does not appear that he had any coingestants with his overdose.  Metabolize at this time and defer TTS consultation.  Update at 10 PM, patient awake alert and oriented at this time it appears to have metabolized.  No need for Narcan while in the emergency department and at this time he is medically clear.  Continues to endorse suicidal ideation with the intent to overdose again.  IVC paperwork  filled out.  TTS consulted. Final Clinical Impressions(s) / ED Diagnoses   Final diagnoses:  Suicidal ideation  Intentional fentanyl overdose, initial encounter [facility](HCC)    ED Discharge Orders    None       Ina KickWestphal, Adonna Horsley, MD 02/05/19 2249    Blane OharaZavitz, Joshua, MD 02/07/19 [phone]

## 2019-02-06 ENCOUNTER — Ambulatory Visit (HOSPITAL_COMMUNITY): Payer: Self-pay

## 2019-02-06 MED ORDER — GABAPENTIN 300 MG PO CAPS
300.0000 mg | ORAL_CAPSULE | Freq: Three times a day (TID) | ORAL | Status: DC
  Administered 2019-02-06 – 2019-02-07 (×3): 300 mg via ORAL
  Filled 2019-02-06 (×3): qty 1

## 2019-02-06 MED ORDER — BUPROPION HCL ER (SR) 100 MG PO TB12
100.0000 mg | ORAL_TABLET | Freq: Two times a day (BID) | ORAL | Status: DC
  Administered 2019-02-06 – 2019-02-07 (×3): 100 mg via ORAL
  Filled 2019-02-06 (×3): qty 1

## 2019-02-06 NOTE — ED Notes (Signed)
Breakfast tray ordered 

## 2019-02-06 NOTE — ED Notes (Signed)
Patient belongings inventoried and in locker#2

## 2019-02-06 NOTE — BH Assessment (Signed)
[facility] Assessment Progress Note   Patient was seen for re-assessment.  He states that he slept well last night and states that his appetite has been good.  When asked if he feels any better today, he states that nothing has changed and he is just as depressed as he was yesterday.  When asked if he is suicidal, patient states, "yes."  When asked what his plan would be to hurt himself, patient states, "if I am discharged I will find a bridge to jump off of."  Patient is unwilling to contract for safety.  Patient denies HI/Psychosis.  TTS/Social Work to continue to seek placement for patient.

## 2019-02-06 NOTE — ED Notes (Addendum)
Pt reports withdrawal symptoms and requesting medications. VS not suggestive of detox, but pt reports feeling hot and sweaty and anxious. Calm and cooperative at this time.  Pt reports feeling suicidal with plan to step out in traffic.

## 2019-02-06 NOTE — Progress Notes (Signed)
Pt. meets criteria for inpatient treatment per Nira Conn, NP.  No appropriate beds available at [facility]. Referred out to the following hospitals: [facility]  [facility]  [facility]  [facility]  [facility]  [facility]  [facility]  [facility]  [facility]  [facility]  [facility]  [facility]  [facility]     Disposition CSW will continue to follow for placement.  Timmothy Euler. Kaylyn Lim, MSW, LCSW Disposition Clinical Social Work [phone] (cell) [phone] (office)

## 2019-02-06 NOTE — ED Notes (Signed)
Regular Diet was ordered for Lunch. 

## 2019-02-06 NOTE — Patient Outreach (Signed)
CPSS tried to meet with the patient to provide substance use recovery support and provide information for substance use treatment resources. Patient was unable to be assessed at this time due to the patient being drowsy. Patient requested CPSS come back at a later time in order to provide CPSS services. Patient currently meets criteria for inpatient psychiatric treatment and has been referred out to several different hospitals that provide inpatient psychiatric treatment.

## 2019-02-06 NOTE — ED Notes (Signed)
Sleeping all day; wakes up when needed, but not enough to eat dinner and has not complained of withdrawal symptoms since this morning. John with Peer Support has been here to speak with pt and pt has not been alert enough to speak with him at this time.

## 2019-02-06 NOTE — ED Notes (Signed)
Pt given food and drink at his request  Sitter at bedside

## 2019-02-06 NOTE — Patient Outreach (Signed)
CPSS tried to meet with the patient again in the [facility] in order to provide substance use recovery support and provide information for substance use treatment resources. Patient is still unable to participate in the CPSS assessment due to the patient being drowsy. CPSS will try to follow up with the patient at a later time.

## 2019-02-06 NOTE — Patient Outreach (Signed)
ED Peer Support Specialist Patient Intake (Complete at intake & 30-60 Day Follow-up)  Name: Cole Morrison  MRN: [id number]  Age: 33 y.o.   Date of Admission: 02/06/2019  Intake: Initial Comments:      Primary Reason Admitted: SI, poly substance use   Lab values: Alcohol/ETOH: Negative Positive UDS? 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 Yes  Number of ED visits: 3  Reason(s) for visit: SI, poly substance use   In past year, has patient been hospitalized for any reason? No  Number of hospitalizations:    Reason(s) for hospitalization:    In past year, has patient been arrested? No  Number of arrests:    Reason(s) for arrest:    In past year, has patient been incarcerated? No  Number of incarcerations:    Reason(s) for incarceration:    In past year, has patient received medication-assisted treatment? No  In past year, patient received the following treatments:    In past year, has patient received any harm reduction services? No  Did this include any of the following?    In past year, has patient received care from a mental health provider for diagnosis other than SUD? Yes([facility])  In past year, is this first time patient has overdosed? (three times)  Number of past overdoses:    In past year, is this first time patient has been hospitalized for an overdose? (just brought one time to the  hospital for a past overdose)  Number of hospitalizations for overdose(s):    Is patient currently receiving treatment for a mental health diagnosis? Yes  Patient reports experiencing difficulty participating in SUD treatment: No    Most important reason(s) for this difficulty?    Has patient received prior services for treatment?    In past, patient has received services from following agencies:    Plan of Care:  Suggested follow up at these agencies/treatment centers: ADS (Alcohol/Drugs Services), MAT (Medication Assistance Treatment)(Patient interested in getting connected to a treatment center that provides MAT. [facility] will provide those resources. )  Other information: [facility]PSS met with the patient to provide substance use recovery support and help with substance use treatment resources. Patient was interested in [facility] MAT program. [facility] will provide information for [facility] MAT program as well as [facility] contact information. Patient has had trouble getting connected to substance use recovery resources due to his out of state medicaid from [place]. Patient still meets inpatient criteria for psychiatric hospitalization and has been referred to several different hospital for a possible inpatient psychiatric admission.    Mason Jim, CPSS  02/06/2019 6:26 PM

## 2019-02-06 NOTE — ED Notes (Signed)
Patient denies pain and is resting comfortably.  

## 2019-02-06 NOTE — ED Notes (Signed)
Continues to be calm and cooperative.

## 2019-02-06 NOTE — ED Notes (Signed)
Woke up and used bathroom. Had a snack and HS meds. He states when asked if he was SI, "probably" is able to contract for safety while here. He states he has been homeless this year and its hard. He has been sleeping in a cement drainage pipe per his report and is complaining of stiff, sore neck. He was given hot packs for this complaint. He reported leg pain of a 7 but states he always has leg pain and that is the reason he takes Neurontin. He is cooperative but abrupt. Will continue to monitor for safety.

## 2019-02-07 ENCOUNTER — Encounter (HOSPITAL_COMMUNITY): Payer: Self-pay | Admitting: Registered Nurse

## 2019-02-07 DIAGNOSIS — F1994 Other psychoactive substance use, unspecified with psychoactive substance-induced mood disorder: Secondary | ICD-10-CM

## 2019-02-07 DIAGNOSIS — R45851 Suicidal ideations: Secondary | ICD-10-CM

## 2019-02-07 NOTE — ED Notes (Signed)
Asleep shortly after taking the Ativan connected to his COWS.

## 2019-02-07 NOTE — ED Provider Notes (Signed)
Medical screening examination/treatment/procedure(s) were conducted as a shared visit with non-physician practitioner(s) and myself.  I personally evaluated the patient during the encounter. Briefly, the patient is a 33 y.o. male here for suicidal ideation.  Has been seen by psychiatry and does not meet inpatient criteria.  IVC was rescinded and patient was discharged from ED in good condition.   EKG Interpretation None          Virgina Norfolk, DO 02/07/19 1133

## 2019-02-07 NOTE — ED Notes (Signed)
Had marked him 0 earlier on his COWS as he was and had been asleep. Now that he has been awake for approx an hour, did a COWS on him and scored him 12 primarily on his restlessness, his complaint of GI upset, and his anxiety. Ativan 1mg  given as ordered.

## 2019-02-07 NOTE — ED Notes (Signed)
Regular Diet was ordered for Lunch. 

## 2019-02-07 NOTE — ED Notes (Signed)
Awake now and restless. Pulse ox on and pulse running about 95. He is complaining of neck pain, took Tylenol and a hot pack for it and encouraged to sit and try to be calm and watch TV. He states he feels pretty good then said he felt bad but no one could do anything about it so why talk about it. He also complains he is used to taking Seroquel 100 mg BID and then 200mg  at HS. He reports getting his RX from [facility] in [place] and to call them to verify. Will notify pharmacy.

## 2019-02-07 NOTE — Progress Notes (Signed)
Pt was assesssed and does not meet inpatient criteria for mental health inpatient treatment.  He wishes to go tot the [facility] [place].  CSW called and spoke to the admissions screening department.  Patient needs to call [phone] for initial screening. CSW called pt's RN, Kriste Basque B., to advise.  If patient receives the go ahead from the [facility] for the next step in the assessment process and decides to go, it may be possible to purchase a one-way ticket to [place].  CSW will continue to follow to assist with discharge.  Timmothy Euler. Kaylyn Lim, MSW, LCSWA Disposition Clinical Social Work [phone] (cell) [phone] (office)

## 2019-02-07 NOTE — ED Notes (Signed)
Breakfast delivered to pt. Pt became upset when was advised coffee is decaf. States he cannot drink decaf - offered pt something else to drink. Pt declined and told this RN to "Fuck off" x 2. Pt eating breakfast.

## 2019-02-07 NOTE — ED Notes (Signed)
Pt discharged safely with resources.  Pt denies S/I.  All belongings were returned to patient.

## 2019-02-07 NOTE — ED Notes (Signed)
Pt stated "I'm not suicidal. I'm just a drug addict. I'm not going to hurt myself".

## 2019-02-07 NOTE — ED Notes (Signed)
BREAKFAST ORDERED  

## 2019-02-07 NOTE — Consult Note (Addendum)
Telepsych Consultation   Reason for Consult:  Suicidal ideation Referring Physician:  Ina Kick, MD Location of Patient: [facility] Location of Provider: [facility]  Patient Identification: Cole Morrison MRN:  [id number] Principal Diagnosis: Substance induced mood disorder (HCC) Diagnosis:  Principal Problem:   Substance induced mood disorder (HCC) Active Problems:   Polysubstance abuse (HCC)   Suicidal ideation   Total Time spent with patient: 45 minutes  Subjective:   Cole Morrison is a 33 y.o. male patient presented to [facility] via EMS with complaint that he had injected a large amount of Fentanyl in an suicide attempt because he is tired of living.  This was patients third ED visit in 48 hours.  For the last 2 days patient has continued to state he was suicidal and if discharged he would jump off bridge or step into traffic.  HPI:  Patient seen via tele psych by this provider; chart reviewed and consulted with Dr. Lucianne Muss on 02/07/19.  On evaluation Cole Morrison reports he is depressed and that he tried to overdose on fentanyl.  States that he has had several suicide attempts in the past (hanging, several overdoses, "I put a gun to my head one time but it didn't go off."  Patient states that stressors are his drug use, homelessness, and no support.  States that he moved from [place] to [place] but none of his family will let him stay with them until he is on his feet related to the "Virus."  Patient states that he has a list of resources but is unable to get in contact with any of them because they are closed related to the virus.  Patient first states that he is suicidal and then changes it stating that "I just said I was thinking of suicide but I'm not going to do anything."  Patient states that he wants to get into a substance abuse program.  "I'm not actually gonna do anything.  I'm not suicidal.  Can you check to see if I can get into the [facility] it's  for rehab"   Patient states that he has been there before and he was clean "for a while" when he got out.  At this time patient denies suicidal/self-harm/homicidal ideation, psychosis, and paranoia.  States that he is not currently having any withdrawal symptoms.  Patient states that he would also like to get set up with outpatient psychiatry so he can get back on his medications and back on Methadone or Suboxone.    During evaluation Cole Morrison is sitting on side of bed; he is alert/oriented x 4; calm/cooperative; and mood congruent with affect.  Patient is speaking in a clear tone at moderate volume, and normal pace; with good eye contact.  His thought process is coherent and relevant; There is no indication that he is currently responding to internal/external stimuli or experiencing delusional thought content.  Patient denies suicidal/self-harm/homicidal ideation, psychosis, and paranoia.  Patient has remained calm throughout assessment and has answered questions appropriately.  Spoke with patients nurse Edie, RN who informs that patient continues to report he is suicidal but patients doesn't present as depressed.  Reports patient agitated and cursed out another nurse this morning because he could not get caffeinated coffee.  States that patient is not present or appear to be having any withdrawal symptoms.    Past Psychiatric History: Polysubstance abuse; substance induced mood disorder; Alcohol use disorder; Bipolar disorder.  Risk to Self:   No  Risk to Others:  Denies Prior Inpatient Therapy:  Yes Prior Outpatient Therapy:  Yes  Past Medical History:  Past Medical History:  Diagnosis Date  . Acid reflux   . Arthritis   . Bipolar 1 disorder (HCC)   . Diabetes mellitus without complication (HCC) Borderline  . Gastritis   . 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 Overactive bladder   . PTSD (post-traumatic stress disorder)     Past Surgical History:  Procedure Laterality Date  .  WISDOM TOOTH EXTRACTION     Family History:  Family History  Problem Relation Age of Onset  . Hypertension Mother   . Hypertension Father    Family Psychiatric  History: Denies Social History:  Social History   Substance and Sexual Activity  Alcohol Use No     Social History   Substance and Sexual Activity  Drug Use Yes  . Types: Marijuana, Oxycodone, Heroin, IV, Methamphetamines   Comment: Heroin, Fentanyl    Social History   Socioeconomic History  . Marital status: Single    Spouse name: Not on file  . Number of children: Not on file  . Years of education: Not on file  . Highest education level: Not on file  Occupational History  . Not on file  Social Needs  . Financial resource strain: Not on file  . Food insecurity:    Worry: Not on file    Inability: Not on file  . Transportation needs:    Medical: Not on file    Non-medical: Not on file  Tobacco Use  . Smoking status: Current Every Day Smoker    Packs/day: 0.50    Types: Cigarettes  . Smokeless tobacco: Never Used  Substance and Sexual Activity  . Alcohol use: No  . Drug use: Yes    Types: Marijuana, Oxycodone, Heroin, IV, Methamphetamines    Comment: Heroin, Fentanyl  . Sexual activity: Not on file  Lifestyle  . Physical activity:    Days per week: Not on file    Minutes per session: Not on file  . Stress: Not on file  Relationships  . Social connections:    Talks on phone: Not on file    Gets together: Not on file    Attends religious service: Not on file    Active member of club or organization: Not on file    Attends meetings of clubs or organizations: Not on file    Relationship status: Not on file  Other Topics Concern  . Not on file  Social History Narrative  . Not on file   Additional Social History:    Allergies:   Allergies  Allergen Reactions  . Haldol [Haloperidol] Anaphylaxis  . Ibuprofen Anaphylaxis, Hives, Nausea And Vomiting and Swelling    Labs:  Results for orders  placed or performed during the hospital encounter of 02/05/19 (from the past 48 hour(s))  Comprehensive metabolic panel     Status: Abnormal   Collection Time: 02/05/19  6:02 PM  Result Value Ref Range   Sodium 136 135 - 145 mmol/L   Potassium 3.5 3.5 - 5.1 mmol/L   Chloride 101 98 - 111 mmol/L   CO2 29 22 - 32 mmol/L   Glucose, Bld 64 (L) 70 - 99 mg/dL   BUN 10 6 - 20 mg/dL   Creatinine, Ser 1.61 0.61 - 1.24 mg/dL   Calcium 9.2 8.9 - 09.6 mg/dL   Total Protein 7.2 6.5 - 8.1 g/dL   Albumin 4.2 3.5 - 5.0  g/dL   AST 26 15 - 41 U/L   ALT 21 0 - 44 U/L   Alkaline Phosphatase 80 38 - 126 U/L   Total Bilirubin 0.6 0.3 - 1.2 mg/dL   GFR calc non Af Amer >60 >60 mL/min   GFR calc Af Amer >60 >60 mL/min   Anion gap 6 5 - 15    Comment: Performed at [facility], [address] [place], [place] [address]  cbc     Status: None   Collection Time: 02/05/19  6:02 PM  Result Value Ref Range   WBC 9.8 4.0 - 10.5 K/uL   RBC 4.74 4.22 - 5.81 MIL/uL   Hemoglobin 14.0 13.0 - 17.0 g/dL   HCT 76.2 83.1 - 51.7 %   MCV 91.8 80.0 - 100.0 fL   MCH 29.5 26.0 - 34.0 pg   MCHC 32.2 30.0 - 36.0 g/dL   RDW 61.6 07.3 - 71.0 %   Platelets 172 150 - 400 K/uL   nRBC 0.0 0.0 - 0.2 %    Comment: Performed at [facility], [address] [place], [place] [address]  Ethanol     Status: None   Collection Time: 02/05/19  6:22 PM  Result Value Ref Range   Alcohol, Ethyl (B) <10 <10 mg/dL    Comment: (NOTE) Lowest detectable limit for serum alcohol is 10 mg/dL. For medical purposes only. Performed at [facility], [address] [place], [place] [address]   Salicylate level     Status: None   Collection Time: 02/05/19  6:22 PM  Result Value Ref Range   Salicylate Lvl <7.0 2.8 - 30.0 mg/dL    Comment: Performed at [facility], [address], [place], [place] [address]  Acetaminophen level     Status: Abnormal   Collection Time: 02/05/19  6:22 PM  Result Value Ref Range    Acetaminophen (Tylenol), Serum <10 (L) 10 - 30 ug/mL    Comment: (NOTE) Therapeutic concentrations vary significantly. A range of 10-30 ug/mL  may be an effective concentration for many patients. However, some  are best treated at concentrations outside of this range. Acetaminophen concentrations >150 ug/mL at 4 hours after ingestion  and >50 ug/mL at 12 hours after ingestion are often associated with  toxic reactions. Performed at [facility], [address], [place], [place] [address]   CBG monitoring, ED     Status: Abnormal   Collection Time: 02/05/19  7:11 PM  Result Value Ref Range   Glucose-Capillary 119 (H) 70 - 99 mg/dL  Rapid urine drug screen (hospital performed)     Status: Abnormal   Collection Time: 02/05/19 10:31 PM  Result Value Ref Range   Opiates POSITIVE (A) NONE DETECTED   Cocaine POSITIVE (A) NONE DETECTED   Benzodiazepines NONE DETECTED NONE DETECTED   Amphetamines NONE DETECTED NONE DETECTED   Tetrahydrocannabinol NONE DETECTED NONE DETECTED   Barbiturates POSITIVE (A) NONE DETECTED    Comment: (NOTE) DRUG SCREEN FOR MEDICAL PURPOSES ONLY.  IF CONFIRMATION IS NEEDED FOR ANY PURPOSE, NOTIFY LAB WITHIN 5 DAYS. LOWEST DETECTABLE LIMITS FOR URINE DRUG SCREEN Drug Class                     Cutoff (ng/mL) Amphetamine and metabolites    1000 Barbiturate and metabolites    200 Benzodiazepine                 200  Tricyclics and metabolites     300 Opiates and metabolites        300 Cocaine and metabolites        300 THC                            50 Performed at [facility], [address], [place], [place] [address]     Medications:  Current Facility-Administered Medications  Medication Dose Route Frequency Provider Last Rate Last Dose  . acetaminophen (TYLENOL) tablet 650 mg  650 mg Oral Q4H PRN Ina Kick, MD   650 mg at 02/07/19 6045  . buPROPion (WELLBUTRIN SR) 12 hr tablet 100 mg  100 mg Oral BID Azalia Bilis, MD   100 mg at  02/07/19 0942  . gabapentin (NEURONTIN) capsule 300 mg  300 mg Oral TID Azalia Bilis, MD   300 mg at 02/07/19 0942  . LORazepam (ATIVAN) injection 0-4 mg  0-4 mg Intravenous Q6H Ina Kick, MD   Stopped at 02/06/19 1703   Or  . LORazepam (ATIVAN) tablet 0-4 mg  0-4 mg Oral Q6H Ina Kick, MD   1 mg at 02/07/19 0438  . [START ON 02/08/2019] LORazepam (ATIVAN) injection 0-4 mg  0-4 mg Intravenous Q12H Ina Kick, MD       Or  . Melene Muller ON 02/08/2019] LORazepam (ATIVAN) tablet 0-4 mg  0-4 mg Oral Q12H Ina Kick, MD      . ondansetron [facility]) tablet 4 mg  4 mg Oral Q8H PRN Ina Kick, MD      . QUEtiapine (SEROQUEL) tablet 50 mg  50 mg Oral BID Ina Kick, MD   50 mg at 02/07/19 0943  . thiamine (VITAMIN B-1) tablet 100 mg  100 mg Oral Daily Ina Kick, MD   100 mg at 02/07/19 4098   Or  . thiamine (B-1) injection 100 mg  100 mg Intravenous Daily Ina Kick, MD       Current Outpatient Medications  Medication Sig Dispense Refill  . naloxone HCl 4 MG/0.1ML LIQD Use as directed for overdose (Patient taking differently: Place 1 spray into the nose once. as directed for overdose) 1 each 1  . ARIPiprazole (ABILIFY) 15 MG tablet Take 15 mg by mouth daily.    . buprenorphine-naloxone (SUBOXONE) 8-2 mg SUBL SL tablet Place 1 tablet under the tongue daily.    Marland Kitchen buPROPion (WELLBUTRIN SR) 200 MG 12 hr tablet Take 100 mg by mouth 2 (two) times daily.     Marland Kitchen gabapentin (NEURONTIN) 300 MG capsule Take 300 mg by mouth 3 (three) times daily.     . QUEtiapine (SEROQUEL) 50 MG tablet Take 50 mg by mouth 2 (two) times daily.       Musculoskeletal: Strength & Muscle Tone: within normal limits Gait & Station: normal Patient leans: N/A  Psychiatric Specialty Exam: Physical Exam  Review of Systems  Psychiatric/Behavioral: Depression: Stable. Hallucinations: Denies. Memory loss: denies. Substance abuse: Polysubstance abuse. Suicidal ideas:  Denies. Nervous/anxious: Stable. Insomnia: Denies.   All other systems reviewed and are negative.   Blood pressure 96/68, pulse 83, temperature 98.1 F (36.7 C), temperature source Oral, resp. rate 18, height  (1.676 m), weight 65.3 kg, SpO2 96 %.Body mass index is 23.24 kg/m.  General Appearance: Casual  Eye Contact:  Good  Speech:  Clear and Coherent and Normal Rate  Volume:  Normal  Mood:  Appropriate  Affect:  Appropriate  Thought Process:  Coherent and Goal Directed  Orientation:  Full (Time, Place, and Person)  Thought Content:  WDL  Suicidal Thoughts:  No  Homicidal Thoughts:  No  Memory:  Immediate;   Good Recent;   Good Remote;   Good  Judgement:  Intact  Insight:  Present  Psychomotor Activity:  Normal  Concentration:  Concentration: Good and Attention Span: Good  Recall:  Good  Fund of Knowledge:  Fair  Language:  Good  Akathisia:  No  Handed:  Right  AIMS (if indicated):     Assets:  Communication Skills Desire for Improvement Physical Health  ADL's:  Intact  Cognition:  WNL  Sleep:        Treatment Plan Summary: Plan Referral/resouces for short/long term rehab and outpatient psychiatric services SW spoke with [facility] [phone]) Patient is to call for phone interview  Disposition: No evidence of imminent risk to self or others at present.   Patient does not meet criteria for psychiatric inpatient admission. Supportive therapy provided about ongoing stressors. Discussed crisis plan, support from social network, calling 911, coming to the Emergency Department, and calling Suicide Hotline.  This service was provided via telemedicine using a 2-way, interactive audio and video technology.  Names of all persons participating in this telemedicine service and their role in this encounter. Name: Assunta FoundShuvon Rankin, NP Role: Tele psych assessment  Name: Dr. Lucianne MussKumar Role: Psychiatrist  Name: Cole Morrison Role: Patient  Name: Unable to speak  with Burna FortsJeff Hedges, or Dr. Criss AlvineGoldston.  Informed Becky, RN and states she will inform provider Role: Informed of above recommendation and disposition    Shuvon Rankin, NP 02/07/2019 10:17 AM

## 2019-02-11 ENCOUNTER — Emergency Department (HOSPITAL_COMMUNITY): Payer: Medicaid - Out of State

## 2019-02-11 ENCOUNTER — Emergency Department (HOSPITAL_COMMUNITY)
Admission: EM | Admit: 2019-02-11 | Discharge: 2019-02-11 | Disposition: A | Discharge status: Home / Self Care | Payer: Medicaid - Out of State | Attending: Emergency Medicine | Admitting: Emergency Medicine

## 2019-02-11 ENCOUNTER — Other Ambulatory Visit: Payer: Self-pay

## 2019-02-11 DIAGNOSIS — R51 Headache: Secondary | ICD-10-CM | POA: Insufficient documentation

## 2019-02-11 DIAGNOSIS — Y929 Unspecified place or not applicable: Secondary | ICD-10-CM | POA: Diagnosis not present

## 2019-02-11 DIAGNOSIS — Y999 Unspecified external cause status: Secondary | ICD-10-CM | POA: Diagnosis not present

## 2019-02-11 DIAGNOSIS — R519 Headache, unspecified: Secondary | ICD-10-CM

## 2019-02-11 DIAGNOSIS — Z79899 Other long term (current) drug therapy: Secondary | ICD-10-CM | POA: Diagnosis not present

## 2019-02-11 DIAGNOSIS — R0981 Nasal congestion: Secondary | ICD-10-CM | POA: Diagnosis not present

## 2019-02-11 DIAGNOSIS — F17210 Nicotine dependence, cigarettes, uncomplicated: Secondary | ICD-10-CM | POA: Diagnosis not present

## 2019-02-11 DIAGNOSIS — R0789 Other chest pain: Secondary | ICD-10-CM | POA: Insufficient documentation

## 2019-02-11 DIAGNOSIS — R55 Syncope and collapse: Secondary | ICD-10-CM | POA: Diagnosis not present

## 2019-02-11 DIAGNOSIS — Y9389 Activity, other specified: Secondary | ICD-10-CM | POA: Diagnosis not present

## 2019-02-11 DIAGNOSIS — E119 Type 2 diabetes mellitus without complications: Secondary | ICD-10-CM | POA: Insufficient documentation

## 2019-02-11 DIAGNOSIS — R042 Hemoptysis: Secondary | ICD-10-CM | POA: Insufficient documentation

## 2019-02-11 LAB — BASIC METABOLIC PANEL
Anion gap: 7 (ref 5–15)
BUN: 17 mg/dL (ref 6–20)
CO2: 27 mmol/L (ref 22–32)
Calcium: 8.4 mg/dL — ABNORMAL LOW (ref 8.9–10.3)
Chloride: 102 mmol/L (ref 98–111)
Creatinine, Ser: 0.78 mg/dL (ref 0.61–1.24)
GFR calc Af Amer: >60 mL/min (ref >60)
GFR calc non Af Amer: >60 mL/min (ref >60)
Glucose, Bld: 107 mg/dL — ABNORMAL HIGH (ref 70–99)
Potassium: 3.2 mmol/L — ABNORMAL LOW (ref 3.5–5.1)
Sodium: 136 mmol/L (ref 135–145)

## 2019-02-11 LAB — CBC WITH DIFFERENTIAL/PLATELET
Abs Immature Granulocytes: 0.06 10*3/uL (ref 0.00–0.07)
Basophils Absolute: 0 10*3/uL (ref 0.0–0.1)
Basophils Relative: 0 %
Eosinophils Absolute: 0.2 10*3/uL (ref 0.0–0.5)
Eosinophils Relative: 1 %
HCT: 36.4 % — ABNORMAL LOW (ref 39.0–52.0)
Hemoglobin: 12 g/dL — ABNORMAL LOW (ref 13.0–17.0)
Immature Granulocytes: 0 %
Lymphocytes Relative: 20 %
Lymphs Abs: 3.1 10*3/uL (ref 0.7–4.0)
MCH: 30.8 pg (ref 26.0–34.0)
MCHC: 33 g/dL (ref 30.0–36.0)
MCV: 93.6 fL (ref 80.0–100.0)
Monocytes Absolute: 0.6 10*3/uL (ref 0.1–1.0)
Monocytes Relative: 4 %
Neutro Abs: 11.4 10*3/uL — ABNORMAL HIGH (ref 1.7–7.7)
Neutrophils Relative %: 75 %
Platelets: 126 10*3/uL — ABNORMAL LOW (ref 150–400)
RBC: 3.89 MIL/uL — ABNORMAL LOW (ref 4.22–5.81)
RDW: 13 % (ref 11.5–15.5)
WBC: 15.4 10*3/uL — ABNORMAL HIGH (ref 4.0–10.5)
nRBC: 0 % (ref 0.0–0.2)

## 2019-02-11 LAB — RAPID URINE DRUG SCREEN, HOSP PERFORMED
Amphetamines: POSITIVE — AB
Barbiturates: NOT DETECTED
Benzodiazepines: NOT DETECTED
Cocaine: NOT DETECTED
Opiates: NOT DETECTED
Tetrahydrocannabinol: POSITIVE — AB

## 2019-02-11 LAB — ACETAMINOPHEN LEVEL: Acetaminophen (Tylenol), Serum: <10 ug/mL — ABNORMAL LOW (ref 10–30)

## 2019-02-11 LAB — TROPONIN I: Troponin I: <0.03 ng/mL (ref <0.03)

## 2019-02-11 LAB — SALICYLATE LEVEL: Salicylate Lvl: <7 mg/dL (ref 2.8–30.0)

## 2019-02-11 LAB — ETHANOL: Alcohol, Ethyl (B): <10 mg/dL (ref <10)

## 2019-02-11 MED ORDER — ACETAMINOPHEN 325 MG PO TABS
650.0000 mg | ORAL_TABLET | Freq: Once | ORAL | Status: AC
  Administered 2019-02-11: 650 mg via ORAL
  Filled 2019-02-11: qty 2

## 2019-02-11 MED ORDER — POTASSIUM CHLORIDE CRYS ER 20 MEQ PO TBCR
40.0000 meq | EXTENDED_RELEASE_TABLET | Freq: Once | ORAL | Status: AC
  Administered 2019-02-11: 18:00:00 40 meq via ORAL
  Filled 2019-02-11: qty 2

## 2019-02-11 NOTE — Discharge Instructions (Signed)
Evaluated today for facial pain and chest wall pain. Labs unremarkable as well as imaging.   I would suggest to stop using heroin.

## 2019-02-11 NOTE — ED Triage Notes (Signed)
Per patient: Pt reportedly took drugs yesterday and his friend preformed CPR. Pt reports he thinks his friend broke his sternum doing CPR and he was coughing up blood.
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
## 2019-02-22 ENCOUNTER — Other Ambulatory Visit: Payer: Self-pay | Admitting: Behavioral Health

## 2019-02-22 ENCOUNTER — Inpatient Hospital Stay (HOSPITAL_COMMUNITY)
Admission: AD | Admit: 2019-02-22 | Discharge: 2019-02-27 | DRG: 885 | Disposition: A | Discharge status: Home / Self Care | Payer: Medicaid - Out of State | Source: Intra-hospital | Attending: Psychiatry | Admitting: Psychiatry

## 2019-02-22 ENCOUNTER — Encounter (HOSPITAL_COMMUNITY): Payer: Self-pay

## 2019-02-22 ENCOUNTER — Emergency Department (HOSPITAL_COMMUNITY)
Admission: EM | Admit: 2019-02-22 | Discharge: 2019-02-22 | Disposition: A | Discharge status: Other Acute Inpatient Hospital | Payer: Medicaid - Out of State | Attending: Emergency Medicine | Admitting: Emergency Medicine

## 2019-02-22 ENCOUNTER — Other Ambulatory Visit: Payer: Self-pay

## 2019-02-22 DIAGNOSIS — F329 Major depressive disorder, single episode, unspecified: Secondary | ICD-10-CM | POA: Diagnosis present

## 2019-02-22 DIAGNOSIS — F1120 Opioid dependence, uncomplicated: Secondary | ICD-10-CM | POA: Diagnosis present

## 2019-02-22 DIAGNOSIS — F1020 Alcohol dependence, uncomplicated: Secondary | ICD-10-CM | POA: Insufficient documentation

## 2019-02-22 DIAGNOSIS — F1123 Opioid dependence with withdrawal: Secondary | ICD-10-CM | POA: Diagnosis present

## 2019-02-22 DIAGNOSIS — G4700 Insomnia, unspecified: Secondary | ICD-10-CM | POA: Diagnosis present

## 2019-02-22 DIAGNOSIS — F17210 Nicotine dependence, cigarettes, uncomplicated: Secondary | ICD-10-CM | POA: Insufficient documentation

## 2019-02-22 DIAGNOSIS — F319 Bipolar disorder, unspecified: Principal | ICD-10-CM | POA: Diagnosis present

## 2019-02-22 DIAGNOSIS — F1110 Opioid abuse, uncomplicated: Secondary | ICD-10-CM

## 2019-02-22 DIAGNOSIS — R45851 Suicidal ideations: Secondary | ICD-10-CM | POA: Insufficient documentation

## 2019-02-22 DIAGNOSIS — E119 Type 2 diabetes mellitus without complications: Secondary | ICD-10-CM | POA: Insufficient documentation

## 2019-02-22 DIAGNOSIS — F332 Major depressive disorder, recurrent severe without psychotic features: Secondary | ICD-10-CM | POA: Diagnosis not present

## 2019-02-22 DIAGNOSIS — F99 Mental disorder, not otherwise specified: Secondary | ICD-10-CM | POA: Diagnosis present

## 2019-02-22 DIAGNOSIS — Z79899 Other long term (current) drug therapy: Secondary | ICD-10-CM | POA: Insufficient documentation

## 2019-02-22 HISTORY — DX: Opioid dependence, uncomplicated: F11.20

## 2019-02-22 HISTORY — DX: Opioid abuse, uncomplicated: F11.10

## 2019-02-22 LAB — CBC WITH DIFFERENTIAL/PLATELET
Abs Immature Granulocytes: 0.02 10*3/uL (ref 0.00–0.07)
Basophils Absolute: 0 10*3/uL (ref 0.0–0.1)
Basophils Relative: 0 %
Eosinophils Absolute: 0 10*3/uL (ref 0.0–0.5)
Eosinophils Relative: 0 %
HCT: 40.1 % (ref 39.0–52.0)
Hemoglobin: 13.4 g/dL (ref 13.0–17.0)
Immature Granulocytes: 0 %
Lymphocytes Relative: 31 %
Lymphs Abs: 2.2 10*3/uL (ref 0.7–4.0)
MCH: 30.9 pg (ref 26.0–34.0)
MCHC: 33.4 g/dL (ref 30.0–36.0)
MCV: 92.6 fL (ref 80.0–100.0)
Monocytes Absolute: 0.5 10*3/uL (ref 0.1–1.0)
Monocytes Relative: 7 %
Neutro Abs: 4.4 10*3/uL (ref 1.7–7.7)
Neutrophils Relative %: 62 %
Platelets: 232 10*3/uL (ref 150–400)
RBC: 4.33 MIL/uL (ref 4.22–5.81)
RDW: 12.6 % (ref 11.5–15.5)
WBC: 7.1 10*3/uL (ref 4.0–10.5)
nRBC: 0 % (ref 0.0–0.2)

## 2019-02-22 LAB — RAPID URINE DRUG SCREEN, HOSP PERFORMED
Amphetamines: POSITIVE — AB
Barbiturates: NOT DETECTED
Benzodiazepines: NOT DETECTED
Cocaine: NOT DETECTED
Opiates: NOT DETECTED
Tetrahydrocannabinol: NOT DETECTED

## 2019-02-22 LAB — COMPREHENSIVE METABOLIC PANEL
ALT: 25 U/L (ref 0–44)
AST: 34 U/L (ref 15–41)
Albumin: 3.9 g/dL (ref 3.5–5.0)
Alkaline Phosphatase: 77 U/L (ref 38–126)
Anion gap: 8 (ref 5–15)
BUN: 14 mg/dL (ref 6–20)
CO2: 23 mmol/L (ref 22–32)
Calcium: 9 mg/dL (ref 8.9–10.3)
Chloride: 106 mmol/L (ref 98–111)
Creatinine, Ser: 0.73 mg/dL (ref 0.61–1.24)
GFR calc Af Amer: >60 mL/min (ref >60)
GFR calc non Af Amer: >60 mL/min (ref >60)
Glucose, Bld: 89 mg/dL (ref 70–99)
Potassium: 3.2 mmol/L — ABNORMAL LOW (ref 3.5–5.1)
Sodium: 137 mmol/L (ref 135–145)
Total Bilirubin: 0.7 mg/dL (ref 0.3–1.2)
Total Protein: 7.4 g/dL (ref 6.5–8.1)

## 2019-02-22 LAB — ETHANOL: Alcohol, Ethyl (B): <10 mg/dL (ref <10)

## 2019-02-22 MED ORDER — LORAZEPAM 1 MG PO TABS
1.0000 mg | ORAL_TABLET | ORAL | Status: AC | PRN
  Administered 2019-02-24: 09:00:00 1 mg via ORAL
  Filled 2019-02-22: qty 1

## 2019-02-22 MED ORDER — HYDROXYZINE HCL 25 MG PO TABS: 25.0000 mg | ORAL_TABLET | Freq: Four times a day (QID) | ORAL | Status: DC | PRN

## 2019-02-22 MED ORDER — LOPERAMIDE HCL 2 MG PO CAPS: 2.0000 mg | ORAL_CAPSULE | ORAL | Status: DC | PRN

## 2019-02-22 MED ORDER — CLONIDINE HCL 0.1 MG PO TABS
0.1000 mg | ORAL_TABLET | Freq: Four times a day (QID) | ORAL | Status: DC
  Administered 2019-02-22 – 2019-02-24 (×7): 0.1 mg via ORAL
  Filled 2019-02-22 (×12): qty 1

## 2019-02-22 MED ORDER — HYDROXYZINE HCL 25 MG PO TABS
25.0000 mg | ORAL_TABLET | Freq: Four times a day (QID) | ORAL | Status: DC | PRN
  Administered 2019-02-22 – 2019-02-26 (×4): 25 mg via ORAL
  Filled 2019-02-22 (×5): qty 1

## 2019-02-22 MED ORDER — METHOCARBAMOL 500 MG PO TABS
500.0000 mg | ORAL_TABLET | Freq: Three times a day (TID) | ORAL | Status: DC | PRN
  Administered 2019-02-22 – 2019-02-26 (×7): 500 mg via ORAL
  Filled 2019-02-22 (×7): qty 1

## 2019-02-22 MED ORDER — ONDANSETRON 4 MG PO TBDP: 4.0000 mg | ORAL_TABLET | Freq: Four times a day (QID) | ORAL | Status: DC | PRN

## 2019-02-22 MED ORDER — POTASSIUM CHLORIDE CRYS ER 20 MEQ PO TBCR
40.0000 meq | EXTENDED_RELEASE_TABLET | Freq: Once | ORAL | Status: AC
  Administered 2019-02-22: 40 meq via ORAL
  Filled 2019-02-22: qty 2

## 2019-02-22 MED ORDER — METHOCARBAMOL 500 MG PO TABS: 500.0000 mg | ORAL_TABLET | Freq: Three times a day (TID) | ORAL | Status: DC | PRN

## 2019-02-22 MED ORDER — ZIPRASIDONE MESYLATE 20 MG IM SOLR
20.0000 mg | INTRAMUSCULAR | Status: AC | PRN
  Administered 2019-02-24: 20 mg via INTRAMUSCULAR
  Filled 2019-02-22: qty 20

## 2019-02-22 MED ORDER — MAGNESIUM HYDROXIDE 400 MG/5ML PO SUSP: 30.0000 mL | Freq: Every day | ORAL | Status: DC | PRN

## 2019-02-22 MED ORDER — OLANZAPINE 5 MG PO TBDP
5.0000 mg | ORAL_TABLET | Freq: Three times a day (TID) | ORAL | Status: DC | PRN
  Administered 2019-02-23 – 2019-02-25 (×3): 5 mg via ORAL
  Filled 2019-02-22 (×4): qty 1

## 2019-02-22 MED ORDER — CLONIDINE HCL 0.1 MG PO TABS
0.1000 mg | ORAL_TABLET | Freq: Every day | ORAL | Status: DC
  Filled 2019-02-22: qty 1

## 2019-02-22 MED ORDER — ACETAMINOPHEN 325 MG PO TABS
650.0000 mg | ORAL_TABLET | Freq: Four times a day (QID) | ORAL | Status: DC | PRN
  Administered 2019-02-25 – 2019-02-26 (×3): 650 mg via ORAL
  Filled 2019-02-22 (×3): qty 2

## 2019-02-22 MED ORDER — DICYCLOMINE HCL 20 MG PO TABS
20.0000 mg | ORAL_TABLET | Freq: Four times a day (QID) | ORAL | Status: DC | PRN
  Administered 2019-02-22: 20 mg via ORAL
  Filled 2019-02-22: qty 1

## 2019-02-22 MED ORDER — LORAZEPAM 1 MG PO TABS
1.0000 mg | ORAL_TABLET | Freq: Once | ORAL | Status: AC
  Administered 2019-02-22: 1 mg via ORAL
  Filled 2019-02-22: qty 1

## 2019-02-22 MED ORDER — QUETIAPINE FUMARATE 50 MG PO TABS
50.0000 mg | ORAL_TABLET | Freq: Every evening | ORAL | Status: DC | PRN
  Administered 2019-02-24: 50 mg via ORAL
  Filled 2019-02-22 (×11): qty 1

## 2019-02-22 MED ORDER — CLONIDINE HCL 0.1 MG PO TABS
0.1000 mg | ORAL_TABLET | ORAL | Status: DC
  Filled 2019-02-22 (×4): qty 1

## 2019-02-22 MED ORDER — DICYCLOMINE HCL 20 MG PO TABS: 20.0000 mg | ORAL_TABLET | Freq: Four times a day (QID) | ORAL | Status: DC | PRN

## 2019-02-22 MED ORDER — ALUM & MAG HYDROXIDE-SIMETH 200-200-20 MG/5ML PO SUSP: 30.0000 mL | ORAL | Status: DC | PRN

## 2019-02-22 NOTE — ED Notes (Signed)
Bed: WA20 Expected date:  Expected time:  Means of arrival:  Comments: EMS-opiate withdrawal

## 2019-02-22 NOTE — ED Notes (Signed)
Patient has been changed into burgundy scrubs and patient belongings have been bagged and tagged.

## 2019-02-22 NOTE — BH Assessment (Signed)
[facility] Assessment Progress Note  Per Garlan Fillers, NP, this pt requires psychiatric hospitalization at this time.  Malva Limes, RN, [facility] has assigned pt to [facility] Rm [id number] with a caveat that results on pt's labs must be returned first.  Pt has signed Voluntary Admission and Consent for Treatment, as well as Consent to Release Information to no one, and signed forms have been faxed to [facility].  Pt's nurse, Lucrezia Europe, has been notified, and agrees to send original paperwork, currently found in pt's chart slot, along with pt via [place], and to call report to [phone].  Doylene Canning, [place] [facility] Coordinator [phone]

## 2019-02-22 NOTE — ED Triage Notes (Signed)
Patient arrived via [facility]. Patient is AOx4 and ambulatory. Patient is Homeless. Patient called 911 with chief complaint of Heroin withdrawal. Patient stated he has been trying to quit and has not used in the past 24 hours. Patient states that patient is experiencing chills, body aches, depression, nausea and vomiting.

## 2019-02-22 NOTE — ED Notes (Signed)
Telepsych. Consult in progress

## 2019-02-22 NOTE — ED Provider Notes (Signed)
[facility]-EMER[facility] DEPT Provider Note   CSN: [id number] Arrival date & time: 02/22/19  1327    History   Chief Complaint Chief Complaint  Patient presents with  . Opioid Detox  . Chronic Opioid User    HPI [facility] is a 33 y.o. male.     33 year old male with history of hepatitis B [place] as well as chronic opiate use presents with suicidal ideations with plan to jump off a bridge.  Patient states he is also going through heroin withdrawal consisting of body aches, nausea and emesis x3.  Denies any fever or chills.  Has had increasing depression due to his drug addiction.  States he has a history of suicide attempt in the past.  Denies any active effort at this time.  Presents via EMS     Past Medical History:  Diagnosis Date  . Acid reflux   . Arthritis   . Bipolar 1 disorder (HCC)   . Diabetes mellitus without complication (HCC) Borderline  . Gastritis   . 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 Opioid abuse (HCC) 02/22/2019  . Opioid dependence (HCC) 02/22/2019  . Overactive bladder   . PTSD (post-traumatic stress disorder)     Patient Active Problem List   Diagnosis Date Noted  . Suicidal ideation 02/07/2019  . Tobacco use disorder 07/22/2016  . Opioid use disorder, moderate, dependence (HCC) 01/16/2014  . Alcohol use disorder, moderate, dependence (HCC) 01/16/2014  . Bipolar disorder current episode depressed (HCC) 01/16/2014  . Polysubstance abuse (HCC) 01/15/2014  . Substance induced mood disorder (HCC) 01/15/2014    Past Surgical History:  Procedure Laterality Date  . WISDOM TOOTH EXTRACTION          Home Medications    Prior to Admission medications   Medication Sig Start Date End Date Taking? Authorizing Provider  ARIPiprazole (ABILIFY) 15 MG tablet Take 15 mg by mouth daily.    [provider]  buprenorphine-naloxone (SUBOXONE) 8-2 mg SUBL SL tablet Place 1 tablet under the tongue daily.    [provider]  buPROPion (WELLBUTRIN SR) 200 MG 12 hr tablet Take 100 mg by mouth 2 (two) times daily.     [provider]  gabapentin (NEURONTIN) 300 MG capsule Take 300 mg by mouth 3 (three) times daily.     [provider]  naloxone HCl 4 MG/0.1ML LIQD Use as directed for overdose Patient taking differently: Place 1 spray into the nose once. as directed for overdose 07/24/16   Pucilowska, Jolanta B, MD  QUEtiapine (SEROQUEL) 50 MG tablet Take 50 mg by mouth 2 (two) times daily.     [provider]    Family History Family History  Problem Relation Age of Onset  . Hypertension Mother   . Hypertension Father     Social History Social History   Tobacco Use  . Smoking status: Current Every Day Smoker    Packs/day: 0.50    Types: Cigarettes  . Smokeless tobacco: Never Used  Substance Use Topics  . Alcohol use: No  . Drug use: Yes    Types: Marijuana, Oxycodone, Heroin, IV, Methamphetamines    Comment: Heroin, Fentanyl     Allergies   Haldol [haloperidol] and Ibuprofen   Review of Systems Review of Systems  All other systems reviewed and are negative.    Physical Exam Updated Vital Signs BP 118/82 (BP Location: Left Arm)   Pulse 84   Temp 98.4 F (36.9 C) (Oral)  Resp 18   Ht 1.702 m (5\' 7" )   Wt 70 kg   SpO2 100%   BMI 24.17 kg/m   Physical Exam Vitals signs and nursing note reviewed.  Constitutional:      General: He is not in acute distress.    Appearance: Normal appearance. He is well-developed. He is not toxic-appearing.  HENT:     Head: Normocephalic and atraumatic.  Eyes:     General: Lids are normal.     Conjunctiva/sclera: Conjunctivae normal.     Pupils: Pupils are equal, round, and reactive to light.  Neck:     Musculoskeletal: Normal range of motion and neck supple.     Thyroid: No thyroid mass.     Trachea: No tracheal deviation.  Cardiovascular:     Rate and Rhythm: Normal rate and regular rhythm.     Heart sounds:  Normal heart sounds. No murmur. No gallop.   Pulmonary:     Effort: Pulmonary effort is normal. No respiratory distress.     Breath sounds: Normal breath sounds. No stridor. No decreased breath sounds, wheezing, rhonchi or rales.  Abdominal:     General: Bowel sounds are normal. There is no distension.     Palpations: Abdomen is soft.     Tenderness: There is no abdominal tenderness. There is no rebound.  Musculoskeletal: Normal range of motion.        General: No tenderness.  Skin:    General: Skin is warm and dry.     Findings: No abrasion or rash.  Neurological:     Mental Status: He is alert and oriented to person, place, and time.     GCS: GCS eye subscore is 4. GCS verbal subscore is 5. GCS motor subscore is 6.     Cranial Nerves: No cranial nerve deficit.     Sensory: No sensory deficit.  Psychiatric:        Attention and Perception: He does not perceive auditory hallucinations.        Mood and Affect: Affect is flat.        Speech: Speech normal.        Behavior: Behavior is withdrawn.        Thought Content: Thought content includes suicidal ideation. Thought content includes suicidal plan.      ED Treatments / Results  Labs (all labs ordered are listed, but only abnormal results are displayed) Labs Reviewed  CBC WITH DIFFERENTIAL/PLATELET  COMPREHENSIVE METABOLIC PANEL  ETHANOL  RAPID URINE DRUG SCREEN, HOSP PERFORMED    EKG None  Radiology No results found.  Procedures Procedures (including critical care time)  Medications Ordered in ED Medications  dicyclomine (BENTYL) tablet 20 mg (has no administration in time range)  hydrOXYzine (ATARAX/VISTARIL) tablet 25 mg (has no administration in time range)  loperamide (IMODIUM) capsule 2-4 mg (has no administration in time range)  methocarbamol (ROBAXIN) tablet 500 mg (has no administration in time range)  ondansetron (ZOFRAN-ODT) disintegrating tablet 4 mg (has no administration in time range)      Initial Impression / Assessment and Plan / ED Course  I have reviewed the triage vital signs and the nursing notes.  Pertinent labs & imaging results that were available during my care of the patient were reviewed by me and considered in my medical decision making (see chart for details).        Patient was stable vital signs here at this time.  Patient placed on opiate detox protocol.  Patient be cleared  for psychiatric evaluation  Final Clinical Impressions(s) / ED Diagnoses   Final diagnoses:  None    ED Discharge Orders    None       Lorre NickAllen, Mackinley Kiehn, MD 02/22/19 1352

## 2019-02-22 NOTE — Tx Team (Signed)
Initial Treatment Plan 02/22/2019 6:59 PM [facility] Comp [id number]    PATIENT STRESSORS: Substance abuse Other: homelessness   PATIENT STRENGTHS: Average or above average intelligence General fund of knowledge   PATIENT IDENTIFIED PROBLEMS: "feel better"  Substance Abuse                   DISCHARGE CRITERIA:  Ability to meet basic life and health needs Adequate post-discharge living arrangements Improved stabilization in mood, thinking, and/or behavior Medical problems require only outpatient monitoring Motivation to continue treatment in a less acute level of care Need for constant or close observation no longer present Reduction of life-threatening or endangering symptoms to within safe limits Safe-care adequate arrangements made Verbal commitment to aftercare and medication compliance Withdrawal symptoms are absent or subacute and managed without 24-hour nursing intervention  PRELIMINARY DISCHARGE PLAN: Outpatient therapy  PATIENT/FAMILY INVOLVEMENT: This treatment plan has been presented to and reviewed with the patient, Cole [facility].  The patient and family have been given the opportunity to ask questions and make suggestions.  Ferrel Logan, RN 02/22/2019, 6:59 PM

## 2019-02-22 NOTE — Progress Notes (Signed)
Cole Morrison was observe laying in bed asleep. Respirations even and unlabored. Will access needs when Pt wakes up. Provider on call notified for PRNs. Support offered. Will continue with POC.

## 2019-02-22 NOTE — Progress Notes (Signed)
Patient ID: Cole Morrison, male   DOB: 1986/06/10, 33 y.o.   MRN: [id number]  Admission Note  D) Patient admitted to the adult unit 300 hall. Patient is a 33 year old male who is voluntary from [facility]. Patient presents irritable on admission. Patient agreeable to signing consents, skin search and vitals but refused to participate in the assessment. Patient states he is withdrawing and feels like "my skin is crawling, I'm angry and I just wanna lay down". Patient requesting medications for detox. Patient denied SI/HI. Admission completed from review of record.  Skin assessment was completed and unremarkable except for track marks to arms bilaterally and red, dry skin on his face. Patient belongings searched with no contraband found. Belongings secured in locker. Vital signs obtained. Snacks and fluids offered.    A) Plan of care, unit policies and patient expectations were explained. Written consents obtained. Patient oriented to the unit and their room. Patient placed on standard q15 safety checks. High fall risk precautions initiated and reviewed with patient.   R) Patient is seen pacing the unit waiting for medications.

## 2019-02-22 NOTE — ED Notes (Signed)
Report given to Henriette Combs for Adult [facility], Room 302, Bed-2.

## 2019-02-22 NOTE — Progress Notes (Signed)
[place] NOVEL CORONAVIRUS (COVID-19) DAILY CHECK-OFF SYMPTOMS - answer yes or no to each - every day NO YES  Have you had a fever in the past 24 hours?  . Fever (Temp > 37.80C / 100F) X   Have you had any of these symptoms in the past 24 hours? . New Cough .  Sore Throat  .  Shortness of Breath .  Difficulty Breathing .  Unexplained Body Aches   X   Have you had any one of these symptoms in the past 24 hours not related to allergies?   . Runny Nose .  Nasal Congestion .  Sneezing   X   If you have had runny nose, nasal congestion, sneezing in the past 24 hours, has it worsened?  X   EXPOSURES - check yes or no X   Have you traveled outside the state in the past 14 days?  X   Have you been in contact with someone with a confirmed diagnosis of COVID-19 or PUI in the past 14 days without wearing appropriate PPE?  X   Have you been living in the same home as a person with confirmed diagnosis of COVID-19 or a PUI (household contact)?    X   Have you been diagnosed with COVID-19?    X              What to do next: Answered NO to all: Answered YES to anything:   Proceed with unit schedule Follow the [facility] Inpatient Flowsheet.   

## 2019-02-22 NOTE — BH Assessment (Signed)
Tele Assessment Note   Patient Name: Cole Morrison MRN: [id number] Referring Physician: Dr. Freida Busman Location of Patient: [place] Location of Provider: Behavioral Health TTS Department  Cole Morrison is an 33 y.o. male. Pt reports SI with a plan to jump off a bridge. Pt denies HI. Pt denies AVH. Pt reports heroin and methamphetamine use. Pt states he is currently withdrawing. Pt states he is homeless. Per Pt he is from [place], and he is currently stuck in [place].  Pt reports previous inpatient treatment. Pt denies current outpatient treatment.  Garlan Fillers, NP recommends inpatient treatment.  Diagnosis:  F11.20 Opioid use, sever; F33.2 MDD  Past Medical History:  Past Medical History:  Diagnosis Date  . Acid reflux   . Arthritis   . Bipolar 1 disorder (HCC)   . Diabetes mellitus without complication (HCC) Borderline  . Gastritis   . 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 Opioid abuse (HCC) 02/22/2019  . Opioid dependence (HCC) 02/22/2019  . Overactive bladder   . PTSD (post-traumatic stress disorder)     Past Surgical History:  Procedure Laterality Date  . WISDOM TOOTH EXTRACTION      Family History:  Family History  Problem Relation Age of Onset  . Hypertension Mother   . Hypertension Father     Social History:  reports that he has been smoking cigarettes. He has been smoking about 0.50 packs per day. He has never used smokeless tobacco. He reports current drug use. Drugs: Marijuana, Oxycodone, Heroin, IV, and Methamphetamines. He reports that he does not drink alcohol.  Additional Social History:  Alcohol / Drug Use Pain Medications: please see mar Prescriptions: please see mar Over the Counter: please see mar History of alcohol / drug use?: Yes Longest period of sobriety (when/how long): unknown Negative Consequences of Use: Financial, Legal, Personal relationships, Work / School Withdrawal Symptoms: Irritability, Diarrhea, Fever / Chills, Sweats, Cramps,  Weakness Substance #1 Name of Substance 1: heroin 1 - Age of First Use: unknown 1 - Amount (size/oz): unknown 1 - Frequency: daily 1 - Duration: ongoing 1 - Last Use / Amount: 02/21/19 Substance #2 Name of Substance 2: meth 2 - Age of First Use: unknown 2 - Amount (size/oz): unknown 2 - Frequency: daily 2 - Duration: ongoing 2 - Last Use / Amount: 02/21/19  CIWA: CIWA-Ar BP: 117/82 Pulse Rate: 87 COWS:    Allergies:  Allergies  Allergen Reactions  . Haldol [Haloperidol] Anaphylaxis  . Ibuprofen Anaphylaxis, Hives, Nausea And Vomiting and Swelling    Home Medications: (Not in a hospital admission)   OB/GYN Status:  No LMP for male patient.  General Assessment Data Location of Assessment: [facility] TTS Assessment: In system Is this a Tele or Face-to-Face Assessment?: Tele Assessment Is this an Initial Assessment or a Re-assessment for this encounter?: Initial Assessment Patient Accompanied by:: N/A Language Other than English: No Living Arrangements: Homeless/Shelter What gender do you identify as?: Male Marital status: Single Maiden name: NA Pregnancy Status: No Living Arrangements: Other (Comment) Can pt return to current living arrangement?: Yes Admission Status: Voluntary Is patient capable of signing voluntary admission?: Yes Referral Source: Self/Family/Friend Insurance type: Medicaid-[place]     Crisis Care Plan Living Arrangements: Other (Comment) Legal Guardian: Other:(self) Name of Psychiatrist: NA Name of Therapist: NA  Education Status Is patient currently in school?: No Is the patient employed, unemployed or receiving disability?: Unemployed  Risk to self with the past 6 months Suicidal Ideation: Yes-Currently Present Has patient been a risk  to self within the past 6 months prior to admission? : Yes Suicidal Intent: Yes-Currently Present Has patient had any suicidal intent within the past 6 months prior to admission? : Yes Is patient at risk  for suicide?: Yes Suicidal Plan?: Yes-Currently Present Has patient had any suicidal plan within the past 6 months prior to admission? : Yes Specify Current Suicidal Plan: Pt states he plans to jump from a bridge Access to Means: Yes Specify Access to Suicidal Means: access to bridges What has been your use of drugs/alcohol within the last 12 months?: heroin, meth Previous Attempts/Gestures: Yes How many times?: 2 Other Self Harm Risks: NA Triggers for Past Attempts: Other (Comment)(SA) Intentional Self Injurious Behavior: None Family Suicide History: No Recent stressful life event(s): Other (Comment)(SA) Persecutory voices/beliefs?: No Depression: Yes Depression Symptoms: Tearfulness, Isolating, Fatigue, Loss of interest in usual pleasures, Feeling worthless/self pity, Feeling angry/irritable Substance abuse history and/or treatment for substance abuse?: Yes Suicide prevention information given to non-admitted patients: Not applicable  Risk to Others within the past 6 months Homicidal Ideation: No Does patient have any lifetime risk of violence toward others beyond the six months prior to admission? : No Thoughts of Harm to Others: No Comment - Thoughts of Harm to Others: NA Current Homicidal Intent: No Current Homicidal Plan: No Access to Homicidal Means: No Identified Victim: NA History of harm to others?: No Assessment of Violence: None Noted Violent Behavior Description: NA Does patient have access to weapons?: No Criminal Charges Pending?: No Does patient have a court date: No Is patient on probation?: No  Psychosis Hallucinations: None noted Delusions: None noted  Mental Status Report Appearance/Hygiene: Disheveled Eye Contact: Fair Motor Activity: Freedom of movement Speech: Logical/coherent Level of Consciousness: Alert Mood: Depressed Affect: Depressed Anxiety Level: Moderate Thought Processes: Coherent, Relevant Judgement: Unimpaired Orientation: Person,  Place, Time, Situation Obsessive Compulsive Thoughts/Behaviors: None  Cognitive Functioning Concentration: Normal Memory: Recent Intact, Remote Intact Is patient IDD: No Insight: Fair Impulse Control: Fair Appetite: Fair Have you had any weight changes? : No Change Sleep: Decreased Total Hours of Sleep: 4 Vegetative Symptoms: None  ADLScreening [facility] Assessment Services) Patient's cognitive ability adequate to safely complete daily activities?: Yes Patient able to express need for assistance with ADLs?: Yes Independently performs ADLs?: Yes (appropriate for developmental age)  Prior Inpatient Therapy Prior Inpatient Therapy: Yes Prior Therapy Dates: Per chart, "01/22/19-01/30/19," 2019. Prior Therapy Facilty/Provider(s): Per chart, "[facility]." Pt reported all psychiatric hospitals [facility] [place] three ([facility] [place]).  Reason for Treatment: Depression, drug use.   Prior Outpatient Therapy Prior Outpatient Therapy: Yes Prior Therapy Dates: Unknown. Prior Therapy Facilty/Provider(s): Dr. Verner CholAnthony Ramsey in [place]. Reason for Treatment: Medication management. Does patient have an ACCT team?: No Does patient have Intensive In-House Services?  : No Does patient have [facility] services? : No Does patient have P4CC services?: No  ADL Screening (condition at time of admission) Patient's cognitive ability adequate to safely complete daily activities?: Yes Is the patient deaf or have difficulty hearing?: No Does the patient have difficulty seeing, even when wearing glasses/contacts?: No Does the patient have difficulty concentrating, remembering, or making decisions?: No Patient able to express need for assistance with ADLs?: Yes Does the patient have difficulty dressing or bathing?: No Independently performs ADLs?: Yes (appropriate for developmental age) Does the patient have difficulty walking or climbing stairs?: No       Abuse/Neglect Assessment (Assessment to  be complete while patient is alone) Abuse/Neglect Assessment Can Be Completed: Yes  Physical Abuse: Denies Verbal Abuse: Denies Sexual Abuse: Denies Exploitation of patient/patient's resources: Denies     Merchant navy officer (For Healthcare) Does Patient Have a Medical Advance Directive?: No Would patient like information on creating a medical advance directive?: No - Patient declined          Disposition:  Disposition Initial Assessment Completed for this Encounter: Yes  This service was provided via telemedicine using a 2-way, interactive audio and video technology.  Names of all persons participating in this telemedicine service and their role in this encounter. Name: [id number] Role: NP  Name:  Role:   Name:  Role:   Name:  Role:     Emmit Pomfret 02/22/2019 2:34 PM

## 2019-02-22 NOTE — Progress Notes (Signed)
Patient ID: Cole Morrison, male   DOB: 11/05/1985, 33 y.o.   MRN: [id number]  [place] NOVEL CORONAVIRUS (COVID-19) DAILY CHECK-OFF SYMPTOMS - answer yes or no to each - every day NO YES  Have you had a fever in the past 24 hours?  . Fever (Temp > 37.80C / 100F) X   Have you had any of these symptoms in the past 24 hours? . New Cough .  Sore Throat  .  Shortness of Breath .  Difficulty Breathing .  Unexplained Body Aches   X   Have you had any one of these symptoms in the past 24 hours not related to allergies?   . Runny Nose .  Nasal Congestion .  Sneezing   X   If you have had runny nose, nasal congestion, sneezing in the past 24 hours, has it worsened?  X   EXPOSURES - check yes or no X   Have you traveled outside the state in the past 14 days?  X   Have you been in contact with someone with a confirmed diagnosis of COVID-19 or PUI in the past 14 days without wearing appropriate PPE?  X   Have you been living in the same home as a person with confirmed diagnosis of COVID-19 or a PUI (household contact)?    X   Have you been diagnosed with COVID-19?    X              What to do next: Answered NO to all: Answered YES to anything:   Proceed with unit schedule Follow the [facility] Inpatient Flowsheet.

## 2019-02-22 NOTE — ED Notes (Signed)
[facility] has been called and will be in route to pick patient up for transport to Adult [facility].

## 2019-02-23 DIAGNOSIS — F332 Major depressive disorder, recurrent severe without psychotic features: Secondary | ICD-10-CM

## 2019-02-23 LAB — COMPREHENSIVE METABOLIC PANEL
ALT: 22 U/L (ref 0–44)
AST: 27 U/L (ref 15–41)
Albumin: 3.7 g/dL (ref 3.5–5.0)
Alkaline Phosphatase: 71 U/L (ref 38–126)
Anion gap: 8 (ref 5–15)
BUN: 11 mg/dL (ref 6–20)
CO2: 22 mmol/L (ref 22–32)
Calcium: 9.4 mg/dL (ref 8.9–10.3)
Chloride: 107 mmol/L (ref 98–111)
Creatinine, Ser: 0.78 mg/dL (ref 0.61–1.24)
GFR calc Af Amer: >60 mL/min (ref >60)
GFR calc non Af Amer: >60 mL/min (ref >60)
Glucose, Bld: 92 mg/dL (ref 70–99)
Potassium: 4.1 mmol/L (ref 3.5–5.1)
Sodium: 137 mmol/L (ref 135–145)
Total Bilirubin: 0.5 mg/dL (ref 0.3–1.2)
Total Protein: 7.1 g/dL (ref 6.5–8.1)

## 2019-02-23 LAB — ETHANOL: Alcohol, Ethyl (B): <10 mg/dL (ref <10)

## 2019-02-23 MED ORDER — QUETIAPINE FUMARATE 100 MG PO TABS
100.0000 mg | ORAL_TABLET | Freq: Every day | ORAL | Status: DC
  Filled 2019-02-23 (×2): qty 1

## 2019-02-23 MED ORDER — GABAPENTIN 300 MG PO CAPS
300.0000 mg | ORAL_CAPSULE | Freq: Three times a day (TID) | ORAL | Status: DC
  Administered 2019-02-23 – 2019-02-25 (×5): 300 mg via ORAL
  Filled 2019-02-23 (×13): qty 1

## 2019-02-23 MED ORDER — NICOTINE 21 MG/24HR TD PT24
21.0000 mg | MEDICATED_PATCH | Freq: Every day | TRANSDERMAL | Status: DC
  Administered 2019-02-23 – 2019-02-27 (×5): 21 mg via TRANSDERMAL
  Filled 2019-02-23 (×7): qty 1

## 2019-02-23 MED ORDER — TRAZODONE HCL 100 MG PO TABS
100.0000 mg | ORAL_TABLET | Freq: Every evening | ORAL | Status: DC | PRN
  Administered 2019-02-25: 100 mg via ORAL
  Filled 2019-02-23 (×3): qty 1

## 2019-02-23 MED ORDER — QUETIAPINE FUMARATE 50 MG PO TABS
50.0000 mg | ORAL_TABLET | Freq: Two times a day (BID) | ORAL | Status: DC
  Administered 2019-02-23 – 2019-02-24 (×4): 50 mg via ORAL
  Filled 2019-02-23 (×9): qty 1
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
## 2019-02-23 NOTE — H&P (Signed)
Psychiatric Admission Assessment Adult  Patient Identification: Cole Morrison MRN:  [id number] Date of Evaluation:  02/23/2019 Chief Complaint:  mdd  opiate use disorder Principal Diagnosis: <principal problem not specified> Diagnosis:  Active Problems:   MDD (major depressive disorder)  History of Present Illness: Patient is seen and examined.  Patient is a 33 year old male with a past psychiatric history significant for opiate dependence, opiate withdrawal and reported bipolar disorder who presented to the [facility] emergency department on 02/22/2019 complaining of heroin and methamphetamine withdrawal.  He stated he is from [place], and he was stuck in [place].  He had no way to get back to [place].  He stated he had been attempting to get to [facility].  He stated that he had been prescribed Seroquel, gabapentin and other medications for his bipolar disorder.  He stated his last prescriptions had been prescribed by a physician in [place].  He has several psychiatric hospitalizations in the past both at our facility as well as [facility].  His diagnosis on the last 2 admissions were severe opioid use disorder as well as polysubstance abuse/dependence.  His last admission at our facility was 2 years ago on 07/22/2016.  He stated he had used methamphetamines as well as heroin the day prior to admission.  His drug screen was positive for amphetamines, but no opiates.  His blood alcohol was negative.  The rest of his screening laboratories were essentially negative.  He denied suicidal ideation this morning.  He stated he was hungry.  He denied any auditory or visual hallucinations.  He was admitted to the hospital for evaluation and stabilization.  Associated Signs/Symptoms: Depression Symptoms:  depressed mood, anhedonia, insomnia, psychomotor agitation, fatigue, feelings of worthlessness/guilt, difficulty concentrating, hopelessness, suicidal  thoughts without plan, anxiety, loss of energy/fatigue, disturbed sleep, weight loss, (Hypo) Manic Symptoms:  Impulsivity, Irritable Mood, Anxiety Symptoms:  Excessive Worry, Psychotic Symptoms:  denied PTSD Symptoms: Negative Total Time spent with patient: 30 minutes  Past Psychiatric History: Patient has a history of several psychiatric hospitalizations.  The patient has been diagnosed with opiate dependence, polysubstance dependence, bipolar disorder.  His last hospitalization at one of our facilities was approximately 2 years ago.  Is the patient at risk to self? Yes.    Has the patient been a risk to self in the past 6 months? Yes.    Has the patient been a risk to self within the distant past? Yes.    Is the patient a risk to others? No.  Has the patient been a risk to others in the past 6 months? No.  Has the patient been a risk to others within the distant past? No.   Prior Inpatient Therapy:   Prior Outpatient Therapy:    Alcohol Screening: 1. How often do you have a drink containing alcohol?: Never 2. How many drinks containing alcohol do you have on a typical day when you are drinking?: 1 or 2 3. How often do you have six or more drinks on one occasion?: Never AUDIT-C Score: 0 9. Have you or someone else been injured as a result of your drinking?: No 10. Has a relative or friend or a doctor or another health worker been concerned about your drinking or suggested you cut down?: No Alcohol Use Disorder Identification Test Final Score (AUDIT): 0 Alcohol Brief Interventions/Follow-up: AUDIT Score <7 follow-up not indicated Substance Abuse History in the last 12 months:  Yes.   Consequences of Substance Abuse: Family Consequences:  :  Homelessness Withdrawal Symptoms:   Cramps Diaphoresis Diarrhea Headaches Nausea Tremors Vomiting Previous Psychotropic Medications: Yes  Psychological Evaluations: Yes  Past Medical History:  Past Medical History:  Diagnosis Date  .  Acid reflux   . Arthritis   . Bipolar 1 disorder (HCC)   . Diabetes mellitus without complication (HCC) Borderline  . Gastritis   . 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 Opioid abuse (HCC) 02/22/2019  . Opioid dependence (HCC) 02/22/2019  . Overactive bladder   . PTSD (post-traumatic stress disorder)     Past Surgical History:  Procedure Laterality Date  . WISDOM TOOTH EXTRACTION     Family History:  Family History  Problem Relation Age of Onset  . Hypertension Mother   . Hypertension Father    Family Psychiatric  History: Noncontributory Tobacco Screening: Have you used any form of tobacco in the last 30 days? (Cigarettes, Smokeless Tobacco, Cigars, and/or Pipes): Yes Tobacco use, Select all that apply: 5 or more cigarettes per day Are you interested in Tobacco Cessation Medications?: No, patient refused Counseled patient on smoking cessation including recognizing danger situations, developing coping skills and basic information about quitting provided: Refused/Declined practical counseling Social History:  Social History   Substance and Sexual Activity  Alcohol Use No     Social History   Substance and Sexual Activity  Drug Use Yes  . Types: Marijuana, Oxycodone, Heroin, IV, Methamphetamines   Comment: Heroin, Fentanyl    Additional Social History:                           Allergies:   Allergies  Allergen Reactions  . Haldol [Haloperidol] Anaphylaxis  . Ibuprofen Anaphylaxis, Hives, Nausea And Vomiting and Swelling   Lab Results:  Results for orders placed or performed during the hospital encounter of 02/22/19 (from the past 48 hour(s))  Comprehensive metabolic panel     Status: None   Collection Time: 02/23/19  6:44 AM  Result Value Ref Range   Sodium 137 135 - 145 mmol/L   Potassium 4.1 3.5 - 5.1 mmol/L   Chloride 107 98 - 111 mmol/L   CO2 22 22 - 32 mmol/L   Glucose, Bld 92 70 - 99 mg/dL   BUN 11 6 - 20 mg/dL   Creatinine, Ser 5.78 0.61 -  1.24 mg/dL   Calcium 9.4 8.9 - 46.9 mg/dL   Total Protein 7.1 6.5 - 8.1 g/dL   Albumin 3.7 3.5 - 5.0 g/dL   AST 27 15 - 41 U/L   ALT 22 0 - 44 U/L   Alkaline Phosphatase 71 38 - 126 U/L   Total Bilirubin 0.5 0.3 - 1.2 mg/dL   GFR calc non Af Amer >60 >60 mL/min   GFR calc Af Amer >60 >60 mL/min   Anion gap 8 5 - 15    Comment: Performed at [facility], [address], [place], [place] [address]  Ethanol     Status: None   Collection Time: 02/23/19  6:44 AM  Result Value Ref Range   Alcohol, Ethyl (B) <10 <10 mg/dL    Comment: (NOTE) Lowest detectable limit for serum alcohol is 10 mg/dL. For medical purposes only. Performed at [facility], [address], [place], [place] [address]     Blood Alcohol level:  Lab Results  Component Value Date   [facility] <10 02/23/2019   ETH <10 02/22/2019    Metabolic Disorder Labs:  No results found for: HGBA1C, MPG No results found for: PROLACTIN No results found for: CHOL, TRIG, HDL, CHOLHDL, VLDL, LDLCALC  Current Medications: Current Facility-Administered Medications  Medication Dose Route Frequency Provider Last Rate Last Dose  . acetaminophen (TYLENOL) tablet 650 mg  650 mg Oral Q6H PRN Denzil Magnusonhomas, Lashunda, NP      . alum & mag hydroxide-simeth (MAALOX/MYLANTA) 200-200-20 MG/5ML suspension 30 mL  30 mL Oral Q4H PRN Denzil Magnusonhomas, Lashunda, NP      . cloNIDine (CATAPRES) tablet 0.1 mg  0.1 mg Oral QID Antonieta Pertlary, Caelen Reierson Lawson, MD   0.1 mg at 02/23/19 1100   Followed by  . [START ON 02/25/2019] cloNIDine (CATAPRES) tablet 0.1 mg  0.1 mg Oral BH-qamhs Sarahlynn Cisnero, Marlane MingleGreg Lawson, MD       Followed by  . [START ON 02/27/2019] cloNIDine (CATAPRES) tablet 0.1 mg  0.1 mg Oral QAC breakfast Antonieta Pertlary, Faye Strohman Lawson, MD      . dicyclomine (BENTYL) tablet 20 mg  20 mg Oral Q6H PRN Denzil Magnusonhomas, Lashunda, NP   20 mg at 02/22/19 1757  . gabapentin (NEURONTIN) capsule 300 mg  300 mg Oral TID Antonieta Pertlary, Dortha Neighbors Lawson, MD   300 mg at 02/23/19 1058  .  hydrOXYzine (ATARAX/VISTARIL) tablet 25 mg  25 mg Oral Q6H PRN Denzil Magnusonhomas, Lashunda, NP   25 mg at 02/22/19 1757  . loperamide (IMODIUM) capsule 2-4 mg  2-4 mg Oral PRN Denzil Magnusonhomas, Lashunda, NP      . OLANZapine zydis (ZYPREXA) disintegrating tablet 5 mg  5 mg Oral Q8H PRN Kerry HoughSimon, Spencer E, PA-C       And  . LORazepam (ATIVAN) tablet 1 mg  1 mg Oral PRN Donell SievertSimon, Spencer E, PA-C       And  . ziprasidone (GEODON) injection 20 mg  20 mg Intramuscular PRN Donell SievertSimon, Spencer E, PA-C      . magnesium hydroxide (MILK OF MAGNESIA) suspension 30 mL  30 mL Oral Daily PRN Denzil Magnusonhomas, Lashunda, NP      . methocarbamol (ROBAXIN) tablet 500 mg  500 mg Oral Q8H PRN Denzil Magnusonhomas, Lashunda, NP   500 mg at 02/23/19 0754  . nicotine (NICODERM CQ - dosed in mg/24 hours) patch 21 mg  21 mg Transdermal Daily Antonieta Pertlary, Ramon Zanders Lawson, MD   21 mg at 02/23/19 0804  . ondansetron (ZOFRAN-ODT) disintegrating tablet 4 mg  4 mg Oral Q6H PRN Denzil Magnusonhomas, Lashunda, NP      . QUEtiapine (SEROQUEL) tablet 100 mg  100 mg Oral QHS Antonieta Pertlary, Cheney Gosch Lawson, MD      . QUEtiapine (SEROQUEL) tablet 50 mg  50 mg Oral QHS,MR X 1 Simon, Spencer E, PA-C      . QUEtiapine (SEROQUEL) tablet 50 mg  50 mg Oral BID Antonieta Pertlary, Whitney Hillegass Lawson, MD   50 mg at 02/23/19 1059  . traZODone (DESYREL) tablet 100 mg  100 mg Oral QHS PRN Antonieta Pertlary, Bakari Nikolai Lawson, MD       PTA Medications: Medications Prior to Admission  Medication Sig Dispense Refill Last Dose  . ARIPiprazole (ABILIFY) 15 MG tablet Take 15 mg by mouth daily.   Not Taking at Unknown time  . buPROPion (WELLBUTRIN SR) 100 MG 12 hr tablet Take 100 mg by mouth 2 (two) times daily.    Not Taking at Unknown time  . gabapentin (NEURONTIN) 300 MG capsule Take 300 mg by mouth 4 (four) times daily.    Not Taking at Unknown time  . naloxone HCl 4 MG/0.1ML LIQD Use as directed for overdose (Patient not  taking: Reported on 02/23/2019) 1 each 1 Not Taking at Unknown time  . QUEtiapine (SEROQUEL) 50 MG tablet Take 50 mg by mouth 2 (two) times daily.    Not  Taking at Unknown time    Musculoskeletal: Strength & Muscle Tone: within normal limits Gait & Station: normal Patient leans: N/A  Psychiatric Specialty Exam: Physical Exam  Nursing note and vitals reviewed. Constitutional: He is oriented to person, place, and time. He appears well-developed and well-nourished.  HENT:  Head: Normocephalic and atraumatic.  Respiratory: Effort normal.  Neurological: He is alert and oriented to person, place, and time.    ROS  Blood pressure 118/74, pulse 64, temperature 98.7 F (37.1 C), temperature source Oral, resp. rate 18, height  (1.676 m), weight 61.2 kg, SpO2 100 %.Body mass index is 21.79 kg/m.  General Appearance: Disheveled  Eye Contact:  Fair  Speech:  Normal Rate  Volume:  Normal  Mood:  Anxious  Affect:  Congruent  Thought Process:  Coherent and Descriptions of Associations: Circumstantial  Orientation:  Full (Time, Place, and Person)  Thought Content:  Logical  Suicidal Thoughts:  No  Homicidal Thoughts:  No  Memory:  Immediate;   Fair Recent;   Fair Remote;   Fair  Judgement:  Intact  Insight:  Lacking  Psychomotor Activity:  Increased  Concentration:  Concentration: Fair and Attention Span: Fair  Recall:  [employer] of Knowledge:  Fair  Language:  Fair  Akathisia:  Negative  Handed:  Right  AIMS (if indicated):     Assets:  Desire for Improvement Resilience  ADL's:  Intact  Cognition:  WNL  Sleep:  Number of Hours: 6.75    Treatment Plan Summary: Daily contact with patient to assess and evaluate symptoms and progress in treatment, Medication management and Plan : Patient is seen and examined.  Patient is a 33 year old male with the above-stated past psychiatric history who was admitted secondary to suicidal ideation.  He also complains of opiate withdrawal and stimulant withdrawal.  He has a history of opiate dependence and methamphetamine dependence.  When he was discharged from [facility] 2 years ago he  was not on any bipolar medications, but on his last hospitalization here he had been placed on Seroquel, gabapentin and trazodone.  He will be admitted to the hospital.  He will be integrated into the milieu.  He will be observed for withdrawal symptoms.  He will be placed on the clonidine withdrawal protocol.  He will also be placed back on Seroquel 50 mg p.o. twice daily and 100 mg p.o. nightly as well as gabapentin 300 mg p.o. 3 times daily.  He will also have available trazodone.  He is interested in residential treatment, and social work will attempt to have him place.  His vital signs are stable, he is afebrile.  He slept 6.75 hours last night without medications.  His laboratories are all essentially negative.  I have encouraged him to get in contact with family in [place] in an attempt to get back to his home.  Observation Level/Precautions:  Detox 15 minute checks  Laboratory:  Chemistry Profile  Psychotherapy:    Medications:    Consultations:    Discharge Concerns:    Estimated LOS:  Other:     Physician Treatment Plan for Primary Diagnosis: <principal problem not specified> Long Term Goal(s): Improvement in symptoms so as ready for discharge  Short Term Goals: Ability to identify changes in lifestyle to reduce recurrence of condition will  improve, Ability to verbalize feelings will improve, Ability to disclose and discuss suicidal ideas, Ability to demonstrate self-control will improve, Ability to identify and develop effective coping behaviors will improve, Ability to maintain clinical measurements within normal limits will improve, Compliance with prescribed medications will improve and Ability to identify triggers associated with substance abuse/mental health issues will improve  Physician Treatment Plan for Secondary Diagnosis: Active Problems:   MDD (major depressive disorder)  Long Term Goal(s): Improvement in symptoms so as ready for discharge  Short Term Goals: Ability to  identify changes in lifestyle to reduce recurrence of condition will improve, Ability to verbalize feelings will improve, Ability to disclose and discuss suicidal ideas, Ability to demonstrate self-control will improve, Ability to identify and develop effective coping behaviors will improve, Ability to maintain clinical measurements within normal limits will improve, Compliance with prescribed medications will improve and Ability to identify triggers associated with substance abuse/mental health issues will improve  I certify that inpatient services furnished can reasonably be expected to improve the patient's condition.    Antonieta Pert, MD 5/7/202012:51 PM

## 2019-02-23 NOTE — [facility] Suicide Risk Assessment (Signed)
[facility]   Nursing information obtained from:  Patient, Review of record Demographic factors:  Male, Caucasian, Low socioeconomic status Current Mental Status:  NA Loss Factors:  Legal issues, Financial problems / change in socioeconomic status, Decline in physical health Historical Factors:  NA Risk Reduction Factors:  Religious beliefs about death  Total Time spent with patient: 30 minutes Principal Problem: <principal problem not specified> Diagnosis:  Active Problems:   MDD (major depressive disorder)  Subjective Data: Patient is seen and examined.  Patient is a 33 year old male with a past psychiatric history significant for opiate dependence, opiate withdrawal and reported bipolar disorder who presented to the [facility] emergency department on 02/22/2019 complaining of heroin and methamphetamine withdrawal.  He stated he is from [place], and [place] was [place] in [place].  He had no way to get back to [place].  He stated he had been attempting to get to [facility].  He stated that he had been prescribed Seroquel, gabapentin and other medications for his bipolar disorder.  He stated his last prescriptions had been prescribed by a physician in [place].  He has several psychiatric hospitalizations in the past both at our facility as well as [facility].  His diagnosis on the last 2 admissions were severe opioid use disorder as well as polysubstance abuse/dependence.  His last admission at our facility was 2 years ago on 07/22/2016.  He stated he had used methamphetamines as well as heroin the day prior to admission.  His drug screen was positive for amphetamines, but no opiates.  His blood alcohol was negative.  The rest of his screening laboratories were essentially negative.  He denied suicidal ideation this morning.  He stated he was hungry.  He denied any auditory or visual hallucinations.  He was admitted to the hospital for  evaluation and stabilization.  Continued Clinical Symptoms:  Alcohol Use Disorder Identification Test Final Score (AUDIT): 0 The "Alcohol Use Disorders Identification Test", Guidelines for Use in Primary Care, Second Edition.  World Science writerHealth Organization [facility](WHO). Score between 0-7:  no or low risk or alcohol related problems. Score between 8-15:  moderate risk of alcohol related problems. Score between 16-19:  high risk of alcohol related problems. Score 20 or above:  warrants further diagnostic evaluation for alcohol dependence and treatment.   CLINICAL FACTORS:   Bipolar Disorder:   Mixed State Alcohol/Substance Abuse/Dependencies   Musculoskeletal: Strength & Muscle Tone: within normal limits Gait & Station: normal Patient leans: N/A  Psychiatric Specialty Exam: Physical Exam  Nursing note and vitals reviewed. Constitutional: He is oriented to person, place, and time. He appears well-developed and well-nourished.  HENT:  Head: Normocephalic and atraumatic.  Respiratory: Effort normal.  Neurological: He is alert and oriented to person, place, and time.    ROS  Blood pressure 118/74, pulse 64, temperature 98.7 F (37.1 C), temperature source Oral, resp. rate 18, height 5\' 6"  (1.676 m), weight 61.2 kg, SpO2 100 %.Body mass index is 21.79 kg/m.  General Appearance: Disheveled  Eye Contact:  Minimal  Speech:  Normal Rate  Volume:  Normal  Mood:  Anxious and Dysphoric  Affect:  Congruent  Thought Process:  Coherent and Descriptions of Associations: Intact  Orientation:  Full (Time, Place, and Person)  Thought Content:  Logical  Suicidal Thoughts:  No  Homicidal Thoughts:  No  Memory:  Immediate;   Fair Recent;   Fair Remote;   Fair  Judgement:  Intact  Insight:  Lacking  Psychomotor Activity:  Normal  Concentration:  Concentration: Fair and Attention Span: Fair  Recall:  [employer] of Knowledge:  Fair  Language:  Fair  Akathisia:  Negative  Handed:  Right  AIMS (if  indicated):     Assets:  Desire for Improvement Resilience  ADL's:  Intact  Cognition:  WNL  Sleep:  Number of Hours: 6.75      COGNITIVE FEATURES THAT CONTRIBUTE TO RISK:  None    SUICIDE RISK:   Minimal: No identifiable suicidal ideation.  Patients presenting with no risk factors but with morbid ruminations; may be classified as minimal risk based on the severity of the depressive symptoms  PLAN OF CARE: Patient is seen and examined.  Patient is a 33 year old male with the above-stated past psychiatric history who was admitted secondary to suicidal ideation.  He also complains of opiate withdrawal and stimulant withdrawal.  He has a history of opiate dependence and methamphetamine dependence.  When he was discharged from [facility] 2 years ago he was not on any bipolar medications, but on his last hospitalization here he had been placed on Seroquel, gabapentin and trazodone.  He will be admitted to the hospital.  He will be integrated into the milieu.  He will be observed for withdrawal symptoms.  He will be placed on the clonidine withdrawal protocol.  He will also be placed back on Seroquel 50 mg p.o. twice daily and 100 mg p.o. nightly as well as gabapentin 300 mg p.o. 3 times daily.  He will also have available trazodone.  He is interested in residential treatment, and social work will attempt to have him place.  His vital signs are stable, he is afebrile.  He slept 6.75 hours last night without medications.  His laboratories are all essentially negative.  I have encouraged him to get in contact with family in [place] in an attempt to get back to his home.  I certify that inpatient services furnished can reasonably be expected to improve the patient's condition.   Antonieta Pert, MD 02/23/2019, 11:10 AM

## 2019-02-23 NOTE — [facility] Suicide Risk Assessment (Signed)
[facility] INPATIENT:  Family/Significant Other Suicide Prevention Education  Suicide Prevention Education:  Patient Refusal for Family/Significant Other Suicide Prevention Education: The patient Cole Morrison has refused to provide written consent for family/significant other to be provided Family/Significant Other Suicide Prevention Education during admission and/or prior to discharge.  Physician notified.  Delphia Grates 02/23/2019, 11:17 AM

## 2019-02-23 NOTE — Progress Notes (Signed)
Patient ID: Cole Morrison, male   DOB: Oct 04, 1986, 33 y.o.   MRN: [id number]  Nursing Progress Note [id number]  Patient stayed in bed for most of shift with complaints of withdrawal. VSS.  Patient is educated about and provided medication per provider's orders. Patient safety maintained with q15 min safety checks and fall risk precautions. Emotional support given, 1:1 interaction, and active listening provided. Patient encouraged to attend meals, groups, and work on treatment plan and goals. Labs, vital signs and patient behavior monitored throughout shift.   Patient remains safe on the unit.

## 2019-02-23 NOTE — Progress Notes (Signed)
Pt did not attend wrap up group this evening.  

## 2019-02-23 NOTE — [facility] Group Notes (Signed)
[facility] LCSW Group Therapy Note  Date/Time: 02/23/2019, 1:30pm  Type of Therapy/Topic:  Group Therapy:  Feelings about Diagnosis  Participation Level:  Did Not Attend   Mood: N/A   Description of Group:    This group will allow patients to explore their thoughts and feelings about diagnoses they have received. Patients will be guided to explore their level of understanding and acceptance of these diagnoses. Facilitator will encourage patients to process their thoughts and feelings about the reactions of others to their diagnosis, and will guide patients in identifying ways to discuss their diagnosis with significant others in their lives. This group will be process-oriented, with patients participating in exploration of their own experiences as well as giving and receiving support and challenge from other group members.   Therapeutic Goals: 1. Patient will demonstrate understanding of diagnosis as evidence by identifying two or more symptoms of the disorder:  2. Patient will be able to express two feelings regarding the diagnosis 3. Patient will demonstrate ability to communicate their needs through discussion and/or role plays  Summary of Patient Progress:   Pt did not attend group.     Therapeutic Modalities:   Cognitive Behavioral Therapy Brief Therapy Feelings Identification   Jhayla Podgorski, LCSW 

## 2019-02-24 MED ORDER — QUETIAPINE FUMARATE 400 MG PO TABS
400.0000 mg | ORAL_TABLET | Freq: Every day | ORAL | Status: DC
  Administered 2019-02-24: 400 mg via ORAL
  Filled 2019-02-24 (×3): qty 1

## 2019-02-24 MED ORDER — QUETIAPINE FUMARATE 200 MG PO TABS
200.0000 mg | ORAL_TABLET | Freq: Every day | ORAL | Status: DC
  Filled 2019-02-24: qty 1

## 2019-02-24 NOTE — Tx Team (Addendum)
Interdisciplinary Treatment and Diagnostic Plan Update  02/24/2019 Time of Session: 02/24/2019; 10:06am Cole Morrison MRN: [id number]  Principal Diagnosis: <principal problem not specified>  Secondary Diagnoses: Active Problems:   MDD (major depressive disorder)   Current Medications:  Current Facility-Administered Medications  Medication Dose Route Frequency Provider Last Rate Last Dose  . acetaminophen (TYLENOL) tablet 650 mg  650 mg Oral Q6H PRN Denzil Magnuson, NP      . alum & mag hydroxide-simeth (MAALOX/MYLANTA) 200-200-20 MG/5ML suspension 30 mL  30 mL Oral Q4H PRN Denzil Magnuson, NP      . cloNIDine (CATAPRES) tablet 0.1 mg  0.1 mg Oral QID Antonieta Pert, MD   0.1 mg at 02/24/19 0841   Followed by  . [START ON 02/25/2019] cloNIDine (CATAPRES) tablet 0.1 mg  0.1 mg Oral BH-qamhs Clary, Marlane Mingle, MD       Followed by  . [START ON 02/27/2019] cloNIDine (CATAPRES) tablet 0.1 mg  0.1 mg Oral QAC breakfast Antonieta Pert, MD      . dicyclomine (BENTYL) tablet 20 mg  20 mg Oral Q6H PRN Denzil Magnuson, NP   20 mg at 02/22/19 1757  . gabapentin (NEURONTIN) capsule 300 mg  300 mg Oral TID Antonieta Pert, MD   300 mg at 02/24/19 0841  . hydrOXYzine (ATARAX/VISTARIL) tablet 25 mg  25 mg Oral Q6H PRN Denzil Magnuson, NP   25 mg at 02/24/19 0328  . loperamide (IMODIUM) capsule 2-4 mg  2-4 mg Oral PRN Denzil Magnuson, NP      . magnesium hydroxide (MILK OF MAGNESIA) suspension 30 mL  30 mL Oral Daily PRN Denzil Magnuson, NP      . methocarbamol (ROBAXIN) tablet 500 mg  500 mg Oral Q8H PRN Denzil Magnuson, NP   500 mg at 02/23/19 1743  . nicotine (NICODERM CQ - dosed in mg/24 hours) patch 21 mg  21 mg Transdermal Daily Antonieta Pert, MD   21 mg at 02/24/19 0840  . OLANZapine zydis (ZYPREXA) disintegrating tablet 5 mg  5 mg Oral Q8H PRN Kerry Hough, PA-C   5 mg at 02/24/19 9562   And  . ziprasidone (GEODON) injection 20 mg  20 mg Intramuscular PRN Kerry Hough, PA-C      . ondansetron (ZOFRAN-ODT) disintegrating tablet 4 mg  4 mg Oral Q6H PRN Denzil Magnuson, NP      . QUEtiapine (SEROQUEL) tablet 200 mg  200 mg Oral QHS Antonieta Pert, MD      . QUEtiapine (SEROQUEL) tablet 50 mg  50 mg Oral QHS,MR X 1 Kerry Hough, PA-C   Stopped at 02/24/19 0203  . QUEtiapine (SEROQUEL) tablet 50 mg  50 mg Oral BID Antonieta Pert, MD   50 mg at 02/24/19 [phone]  . traZODone (DESYREL) tablet 100 mg  100 mg Oral QHS PRN Antonieta Pert, MD       PTA Medications: Medications Prior to Admission  Medication Sig Dispense Refill Last Dose  . ARIPiprazole (ABILIFY) 15 MG tablet Take 15 mg by mouth daily.   Not Taking at Unknown time  . buPROPion (WELLBUTRIN SR) 100 MG 12 hr tablet Take 100 mg by mouth 2 (two) times daily.    Not Taking at Unknown time  . gabapentin (NEURONTIN) 300 MG capsule Take 300 mg by mouth 4 (four) times daily.    Not Taking at Unknown time  . naloxone HCl 4 MG/0.1ML LIQD Use as directed for overdose (Patient not taking: Reported  on 02/23/2019) 1 each 1 Not Taking at Unknown time  . QUEtiapine (SEROQUEL) 50 MG tablet Take 50 mg by mouth 2 (two) times daily.    Not Taking at Unknown time    Patient Stressors: Substance abuse Other: homelessness  Patient Strengths: Average or above average intelligence General fund of knowledge  Treatment Modalities: Medication Management, Group therapy, Case management,  1 to 1 session with clinician, Psychoeducation, Recreational therapy.   Physician Treatment Plan for Primary Diagnosis: <principal problem not specified> Long Term Goal(s): Improvement in symptoms so as ready for discharge Improvement in symptoms so as ready for discharge   Short Term Goals: Ability to identify changes in lifestyle to reduce recurrence of condition will improve Ability to verbalize feelings will improve Ability to disclose and discuss suicidal ideas Ability to demonstrate self-control will improve Ability to  identify and develop effective coping behaviors will improve Ability to maintain clinical measurements within normal limits will improve Compliance with prescribed medications will improve Ability to identify triggers associated with substance abuse/mental health issues will improve Ability to identify changes in lifestyle to reduce recurrence of condition will improve Ability to verbalize feelings will improve Ability to disclose and discuss suicidal ideas Ability to demonstrate self-control will improve Ability to identify and develop effective coping behaviors will improve Ability to maintain clinical measurements within normal limits will improve Compliance with prescribed medications will improve Ability to identify triggers associated with substance abuse/mental health issues will improve  Medication Management: Evaluate patient's response, side effects, and tolerance of medication regimen.  Therapeutic Interventions: 1 to 1 sessions, Unit Group sessions and Medication administration.  Evaluation of Outcomes: Progressing  Physician Treatment Plan for Secondary Diagnosis: Active Problems:   MDD (major depressive disorder)  Long Term Goal(s): Improvement in symptoms so as ready for discharge Improvement in symptoms so as ready for discharge   Short Term Goals: Ability to identify changes in lifestyle to reduce recurrence of condition will improve Ability to verbalize feelings will improve Ability to disclose and discuss suicidal ideas Ability to demonstrate self-control will improve Ability to identify and develop effective coping behaviors will improve Ability to maintain clinical measurements within normal limits will improve Compliance with prescribed medications will improve Ability to identify triggers associated with substance abuse/mental health issues will improve Ability to identify changes in lifestyle to reduce recurrence of condition will improve Ability to verbalize  feelings will improve Ability to disclose and discuss suicidal ideas Ability to demonstrate self-control will improve Ability to identify and develop effective coping behaviors will improve Ability to maintain clinical measurements within normal limits will improve Compliance with prescribed medications will improve Ability to identify triggers associated with substance abuse/mental health issues will improve     Medication Management: Evaluate patient's response, side effects, and tolerance of medication regimen.  Therapeutic Interventions: 1 to 1 sessions, Unit Group sessions and Medication administration.  Evaluation of Outcomes: Progressing   RN Treatment Plan for Primary Diagnosis: <principal problem not specified> Long Term Goal(s): Knowledge of disease and therapeutic regimen to maintain health will improve  Short Term Goals: Ability to participate in decision making will improve, Ability to verbalize feelings will improve, Ability to disclose and discuss suicidal ideas, Ability to identify and develop effective coping behaviors will improve and Compliance with prescribed medications will improve  Medication Management: RN will administer medications as ordered by provider, will assess and evaluate patient's response and provide education to patient for prescribed medication. RN will report any adverse and/or side effects to prescribing  provider.  Therapeutic Interventions: 1 on 1 counseling sessions, Psychoeducation, Medication administration, Evaluate responses to treatment, Monitor vital signs and CBGs as ordered, Perform/monitor CIWA, COWS, AIMS and Fall Risk screenings as ordered, Perform wound care treatments as ordered.  Evaluation of Outcomes: Progressing   LCSW Treatment Plan for Primary Diagnosis: <principal problem not specified> Long Term Goal(s): Safe transition to appropriate next level of care at discharge, Engage patient in therapeutic group addressing interpersonal  concerns.  Short Term Goals: Engage patient in aftercare planning with referrals and resources and Increase skills for wellness and recovery  Therapeutic Interventions: Assess for all discharge needs, 1 to 1 time with Social worker, Explore available resources and support systems, Assess for adequacy in community support network, Educate family and significant other(s) on suicide prevention, Complete Psychosocial Assessment, Interpersonal group therapy.  Evaluation of Outcomes: Progressing   Progress in Treatment: Attending groups: No. Participating in groups: No. Taking medication as prescribed: Yes. Toleration medication: Yes. Family/Significant other contact made: No, will contact:  pt denied Patient understands diagnosis: Yes. Discussing patient identified problems/goals with staff: Yes. Medical problems stabilized or resolved: Yes. Denies suicidal/homicidal ideation: Yes. Issues/concerns per patient self-inventory: No. Other:   New problem(s) identified: No, Describe:  None  New Short Term/Long Term Goal(s):  Patient Goals:  Pt reports not having a goal right now.  Discharge Plan or Barriers:   Reason for Continuation of Hospitalization: Medication stabilization Withdrawal symptoms  Estimated Length of Stay: 2-3 days  Attendees: Patient: 02/24/2019   Physician: Dr. Landry MellowGreg Clary 02/24/2019   Nursing: Alexia FreestonePatty, RN 02/24/2019   RN Care Manager: 02/24/2019   Social Worker: Stephannie PetersJasmine Andric Kerce, LCSW 02/24/2019   Recreational Therapist:  02/24/2019   Other:  02/24/2019   Other:  02/24/2019   Other: 02/24/2019    Scribe for Treatment Team: Delphia GratesJasmine M Ixel Boehning, LCSW 02/24/2019 10:45 AM

## 2019-02-24 NOTE — [facility] Group Notes (Signed)
[facility] LCSW Group Therapy Note  Date/Time: 02/24/19, 1315  Type of Therapy and Topic:  Group Therapy:  Feelings around Relapse and Recovery  Participation Level:  Did Not Attend   Mood:  Description of Group:    Patients in this group will discuss emotions they experience before and after a relapse. They will process how experiencing these feelings, or avoidance of experiencing them, relates to having a relapse. Facilitator will guide patients to explore emotions they have related to recovery. Patients will be encouraged to process which emotions are more powerful. They will be guided to discuss the emotional reaction significant others in their lives may have to patients' relapse or recovery. Patients will be assisted in exploring ways to respond to the emotions of others without this contributing to a relapse.  Therapeutic Goals: 1. Patient will identify two or more emotions that lead to relapse for them:  2. Patient will identify two emotions that result when they relapse:  3. Patient will identify two emotions related to recovery:  4. Patient will demonstrate ability to communicate their needs through discussion and/or role plays.   Summary of Patient Progress:     Therapeutic Modalities:   Cognitive Behavioral Therapy Solution-Focused Therapy Assertiveness Training Relapse Prevention Therapy  Greg Mordechai Matuszak, LCSW       

## 2019-02-24 NOTE — Plan of Care (Signed)
  Problem: Education: Goal: Knowledge of [facility] Education information/materials will improve Outcome: Not Progressing Goal: Emotional status will improve Outcome: Not Progressing   

## 2019-02-24 NOTE — Progress Notes (Signed)
D: Patient has slept all night after receiving zydis for agitation and irritability after dinner. Patient's hs meds not given for this reason. Patient up briefly at 0330 and received vistaril prn per his request. Denies pain, physical complaints. COVID-19 screen negative, afebrile. Respiratory assessment WDL.  A: Medicated per orders. Medication education provided. Level III obs in place for safety. Emotional support offered.   R:  On reassess, patient was asleep. Patient denies SI/HI/AVH and remains safe on level III obs. Will continue to monitor throughout the night.

## 2019-02-24 NOTE — Progress Notes (Signed)
CSW received a phone call from [place] from [facility] in [place] where his psychiatrist is located. She stated that she is going to look for residential treatment for him in [place] and get back to the CSW. Sheppard Plumber gave a phone number for their transportation company named, Leda Quail [phone]) that would assist in transportation of the patient from [place] to [place]. Sheppard Plumber stated that she will work on the referral and get back to [place]. CSW is going to confirm information with patient.

## 2019-02-24 NOTE — Progress Notes (Signed)
[facility] MD Progress Note  02/24/2019 12:22 PM Cole Morrison  MRN:  [id number] Subjective:  Patient is seen and examined.  Patient is a 33 year old male with a past psychiatric history significant for opiate dependence, opiate withdrawal and reported bipolar disorder who presented to the [facility] emergency department on 02/22/2019 complaining of heroin and methamphetamine withdrawal.  He stated he is from [place], and he was stuck in [place].  He had no way to get back to [place].  He stated he had been attempting to get to [facility].  He stated that he had been prescribed Seroquel, gabapentin and other medications for his bipolar disorder.  He stated his last prescriptions had been prescribed by a physician in [place]  Objective: Patient is seen and examined.  Patient is a 33 year old male with the above-stated past psychiatric history is seen in follow-up.  He is fairly agitated this morning.  He is requesting Suboxone.  I told him that given his history, and the inability to obtain this as an outpatient that I thought it would not be a constructive thing.  His agitation increased this a.m., and he required Geodon 20 mg IM to calm him down.  He remains on the clonidine opioid detox protocol.  He also has written for him Zyprexa as needed for agitation as well as lorazepam and Geodon.  He received Seroquel 300 mg p.o. nightly last night, and Seroquel 50 mg p.o. twice daily per his previous admission.  His vital signs are stable, he is afebrile.  He slept 6.5 hours last night.  Review of his laboratories revealed basically only amphetamines in his drug screen.  The rest of his laboratories were essentially normal.  Principal Problem: <principal problem not specified> Diagnosis: Active Problems:   MDD (major depressive disorder)  Total Time spent with patient: 15 minutes  Past Psychiatric History: See admission H&P  Past Medical History:  Past Medical History:  Diagnosis Date   . Acid reflux   . Arthritis   . Bipolar 1 disorder (HCC)   . Diabetes mellitus without complication (HCC) Borderline  . Gastritis   . 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 Opioid abuse (HCC) 02/22/2019  . Opioid dependence (HCC) 02/22/2019  . Overactive bladder   . PTSD (post-traumatic stress disorder)     Past Surgical History:  Procedure Laterality Date  . WISDOM TOOTH EXTRACTION     Family History:  Family History  Problem Relation Age of Onset  . Hypertension Mother   . Hypertension Father    Family Psychiatric  History: See admission H&P Social History:  Social History   Substance and Sexual Activity  Alcohol Use No     Social History   Substance and Sexual Activity  Drug Use Yes  . Types: Marijuana, Oxycodone, Heroin, IV, Methamphetamines   Comment: Heroin, Fentanyl    Social History   Socioeconomic History  . Marital status: Single    Spouse name: Not on file  . Number of children: Not on file  . Years of education: Not on file  . Highest education level: Not on file  Occupational History  . Not on file  Social Needs  . Financial resource strain: Not on file  . Food insecurity:    Worry: Not on file    Inability: Not on file  . Transportation needs:    Medical: Not on file    Non-medical: Not on file  Tobacco Use  . Smoking status: Current Every Day  Smoker    Packs/day: 0.50    Types: Cigarettes  . Smokeless tobacco: Never Used  Substance and Sexual Activity  . Alcohol use: No  . Drug use: Yes    Types: Marijuana, Oxycodone, Heroin, IV, Methamphetamines    Comment: Heroin, Fentanyl  . Sexual activity: Not Currently  Lifestyle  . Physical activity:    Days per week: Not on file    Minutes per session: Not on file  . Stress: Not on file  Relationships  . Social connections:    Talks on phone: Not on file    Gets together: Not on file    Attends religious service: Not on file    Active member of club or organization: Not on file     Attends meetings of clubs or organizations: Not on file    Relationship status: Not on file  Other Topics Concern  . Not on file  Social History Narrative  . Not on file   Additional Social History:                         Sleep: Fair  Appetite:  Good  Current Medications: Current Facility-Administered Medications  Medication Dose Route Frequency Provider Last Rate Last Dose  . acetaminophen (TYLENOL) tablet 650 mg  650 mg Oral Q6H PRN Denzil Magnuson, NP      . alum & mag hydroxide-simeth (MAALOX/MYLANTA) 200-200-20 MG/5ML suspension 30 mL  30 mL Oral Q4H PRN Denzil Magnuson, NP      . cloNIDine (CATAPRES) tablet 0.1 mg  0.1 mg Oral QID Antonieta Pert, MD   0.1 mg at 02/24/19 0841   Followed by  . [START ON 02/25/2019] cloNIDine (CATAPRES) tablet 0.1 mg  0.1 mg Oral BH-qamhs Clary, Marlane Mingle, MD       Followed by  . [START ON 02/27/2019] cloNIDine (CATAPRES) tablet 0.1 mg  0.1 mg Oral QAC breakfast Antonieta Pert, MD      . dicyclomine (BENTYL) tablet 20 mg  20 mg Oral Q6H PRN Denzil Magnuson, NP   20 mg at 02/22/19 1757  . gabapentin (NEURONTIN) capsule 300 mg  300 mg Oral TID Antonieta Pert, MD   300 mg at 02/24/19 0841  . hydrOXYzine (ATARAX/VISTARIL) tablet 25 mg  25 mg Oral Q6H PRN Denzil Magnuson, NP   25 mg at 02/24/19 1047  . loperamide (IMODIUM) capsule 2-4 mg  2-4 mg Oral PRN Denzil Magnuson, NP      . magnesium hydroxide (MILK OF MAGNESIA) suspension 30 mL  30 mL Oral Daily PRN Denzil Magnuson, NP      . methocarbamol (ROBAXIN) tablet 500 mg  500 mg Oral Q8H PRN Denzil Magnuson, NP   500 mg at 02/24/19 1047  . nicotine (NICODERM CQ - dosed in mg/24 hours) patch 21 mg  21 mg Transdermal Daily Antonieta Pert, MD   21 mg at 02/24/19 0840  . OLANZapine zydis (ZYPREXA) disintegrating tablet 5 mg  5 mg Oral Q8H PRN Kerry Hough, PA-C   5 mg at 02/24/19 0846  . ondansetron (ZOFRAN-ODT) disintegrating tablet 4 mg  4 mg Oral Q6H PRN Denzil Magnuson, NP      . QUEtiapine (SEROQUEL) tablet 400 mg  400 mg Oral QHS Antonieta Pert, MD      . QUEtiapine (SEROQUEL) tablet 50 mg  50 mg Oral QHS,MR X 1 Kerry Hough, PA-C   Stopped at 02/24/19 0203  . QUEtiapine (SEROQUEL) tablet  50 mg  50 mg Oral BID Antonieta Pertlary, Greg Lawson, MD   50 mg at 02/24/19 [id number]  . traZODone (DESYREL) tablet 100 mg  100 mg Oral QHS PRN Antonieta Pertlary, Greg Lawson, MD        Lab Results:  Results for orders placed or performed during the hospital encounter of 02/22/19 (from the past 48 hour(s))  Comprehensive metabolic panel     Status: None   Collection Time: 02/23/19  6:44 AM  Result Value Ref Range   Sodium 137 135 - 145 mmol/L   Potassium 4.1 3.5 - 5.1 mmol/L   Chloride 107 98 - 111 mmol/L   CO2 22 22 - 32 mmol/L   Glucose, Bld 92 70 - 99 mg/dL   BUN 11 6 - 20 mg/dL   Creatinine, Ser 9.600.78 0.61 - 1.24 mg/dL   Calcium 9.4 8.9 - 45.410.3 mg/dL   Total Protein 7.1 6.5 - 8.1 g/dL   Albumin 3.7 3.5 - 5.0 g/dL   AST 27 15 - 41 U/L   ALT 22 0 - 44 U/L   Alkaline Phosphatase 71 38 - 126 U/L   Total Bilirubin 0.5 0.3 - 1.2 mg/dL   GFR calc non Af Amer >60 >60 mL/min   GFR calc Af Amer >60 >60 mL/min   Anion gap 8 5 - 15    Comment: Performed at [facility], [address] [place], [place] [address]  Ethanol     Status: None   Collection Time: 02/23/19  6:44 AM  Result Value Ref Range   Alcohol, Ethyl (B) <10 <10 mg/dL    Comment: (NOTE) Lowest detectable limit for serum alcohol is 10 mg/dL. For medical purposes only. Performed at [facility], [address] [place], [place] [address]     Blood Alcohol level:  Lab Results  Component Value Date   ETH <10 02/23/2019   ETH <10 02/22/2019    Metabolic Disorder Labs: No results found for: HGBA1C, MPG No results found for: PROLACTIN No results found for: CHOL, TRIG, HDL, CHOLHDL, VLDL, LDLCALC  Physical Findings: AIMS: Facial and Oral Movements Muscles of Facial  Expression: None, normal Lips and Perioral Area: None, normal Jaw: None, normal Tongue: None, normal,Extremity Movements Upper (arms, wrists, hands, fingers): None, normal Lower (legs, knees, ankles, toes): None, normal, Trunk Movements Neck, shoulders, hips: None, normal, Overall Severity Severity of abnormal movements (highest score from questions above): None, normal Incapacitation due to abnormal movements: None, normal Patient's awareness of abnormal movements (rate only patient's report): No Awareness, Dental Status Current problems with teeth and/or dentures?: No Does patient usually wear dentures?: No  CIWA:  CIWA-Ar Total: 9 COWS:  COWS Total Score: 13  Musculoskeletal: Strength & Muscle Tone: within normal limits Gait & Station: normal Patient leans: N/A  Psychiatric Specialty Exam: Physical Exam  Nursing note and vitals reviewed. Constitutional: He is oriented to person, place, and time. He appears well-developed and well-nourished.  HENT:  Head: Normocephalic and atraumatic.  Respiratory: Effort normal.  Neurological: He is alert and oriented to person, place, and time.    ROS  Blood pressure 114/70, pulse 82, temperature 98.5 F (36.9 C), resp. rate 16, height 5\' 6"  (1.676 m), weight 61.2 kg, SpO2 100 %.Body mass index is 21.79 kg/m.  General Appearance: Disheveled  Eye Contact:  Fair  Speech:  Normal Rate  Volume:  Increased  Mood:  Anxious, Dysphoric and Irritable  Affect:  Congruent  Thought Process:  Coherent and Descriptions of  Associations: Circumstantial  Orientation:  Full (Time, Place, and Person)  Thought Content:  Rumination  Suicidal Thoughts:  No  Homicidal Thoughts:  No  Memory:  Immediate;   Fair Recent;   Fair Remote;   Fair  Judgement:  Impaired  Insight:  Lacking  Psychomotor Activity:  Increased  Concentration:  Concentration: Poor and Attention Span: Poor  Recall:  [employer] of Knowledge:  Fair  Language:  Fair  Akathisia:   Negative  Handed:  Right  AIMS (if indicated):     Assets:  Desire for Improvement Resilience  ADL's:  Impaired  Cognition:  WNL  Sleep:  Number of Hours: 6.5     Treatment Plan Summary: Daily contact with patient to assess and evaluate symptoms and progress in treatment, Medication management and Plan : Patient is seen and examined.  Patient is a 33 year old male with the above-stated past psychiatric history who is seen in follow-up.   Diagnosis: #1 opiate dependence, #2 opiate withdrawal, #3 stimulant dependence, #4 history of bipolar disorder  Patient remains agitated this a.m.  He is in significant withdrawal from the opiates most probably.  He required Geodon this a.m. secondary to agitation.  He remains on the opiate detox protocol.  I have increased his Seroquel up to 400 mg p.o. nightly and continued the Seroquel 50 mg p.o. twice daily.  We will monitor how he does with this during today, but if he needs a different antipsychotic to stabilize his mood we will substitute that.  I would consider a mood stabilizing medication like Depakote or Tegretol, but given his continued substance abuse the drug interactions may lead to a poor outcome.  1.  Continue clonidine/opioid detox protocol for opiate dependence as well as opiate withdrawal. 2.  Continue gabapentin 300 mg p.o. 3 times daily for anxiety, chronic pain and mood stability. 3.  Continue hydroxyzine 25 mg p.o. every 6 hours as needed anxiety. 4.  Continue Seroquel 50 mg p.o. twice daily and increase Seroquel for 100 mg nightly for mood stability. 5.  Disposition planning-in progress.  Antonieta Pert, MD 02/24/2019, 12:22 PM

## 2019-02-24 NOTE — Progress Notes (Signed)
D Patient presents to this Clinical research associate- he wears hospital-issue patient scrubs. His hair is messed up. He is irritable, agitated, he paces, he is irrational,, not allowing himslef to hear this wirter's message as she tries to deescalate him earlier today. He is focused on " getting something for my anxiety.Marland KitchenMarland KitchenMarland KitchenI can't stand it///I really can't!!!".      AHe did complete his daily assessment and on this he wrote he has had SI today ( he contracted with this writer to not act on SI, saying " I can't hurt myself here anyways..."). He rated his depression, hopelessness and anxiety " 8/8/7", respectively.  This morning, he was gien prns of ativan and zyprexa- both that he reported did not help him at all. Approx 45 min after taking both those meds, he was out in the hall, pacing back and forth slamming his hand on hte wall, he became beat red and said " I gottat have something NOW!". Per MD , pt was given 20 mg IM geodon, then slept approx 3 1/2 hours, woke up, snacked after missing lunch then returned to nap an additional hour and is now eating his dinner. At dinnertime med pass, while he is anxious, he allows himslef to hear nurses' words, to take meds and to take deep breaths- to assisst him in calming down. He denies AVH.      R Safety is in place.

## 2019-02-25 MED ORDER — BUPROPION HCL ER (SR) 100 MG PO TB12
100.0000 mg | ORAL_TABLET | Freq: Every day | ORAL | Status: DC
  Administered 2019-02-25 – 2019-02-26 (×2): 100 mg via ORAL
  Filled 2019-02-25 (×4): qty 1

## 2019-02-25 MED ORDER — GABAPENTIN 300 MG PO CAPS
300.0000 mg | ORAL_CAPSULE | ORAL | Status: AC
  Administered 2019-02-25: 300 mg via ORAL
  Filled 2019-02-25 (×2): qty 1

## 2019-02-25 MED ORDER — TRAZODONE HCL 100 MG PO TABS
100.0000 mg | ORAL_TABLET | Freq: Every evening | ORAL | Status: DC | PRN
  Administered 2019-02-26: 100 mg via ORAL
  Filled 2019-02-25 (×5): qty 1

## 2019-02-25 MED ORDER — GABAPENTIN 400 MG PO CAPS
400.0000 mg | ORAL_CAPSULE | Freq: Three times a day (TID) | ORAL | Status: DC
  Administered 2019-02-25 – 2019-02-27 (×5): 400 mg via ORAL
  Filled 2019-02-25: qty 1
  Filled 2019-02-25: qty 21
  Filled 2019-02-25: qty 1
  Filled 2019-02-25: qty 21
  Filled 2019-02-25 (×2): qty 1
  Filled 2019-02-25: qty 21
  Filled 2019-02-25 (×2): qty 1

## 2019-02-25 MED ORDER — OLANZAPINE 5 MG PO TBDP
5.0000 mg | ORAL_TABLET | Freq: Once | ORAL | Status: AC
  Administered 2019-02-25: 5 mg via ORAL
  Filled 2019-02-25 (×2): qty 1

## 2019-02-25 MED ORDER — OLANZAPINE 5 MG PO TABS
5.0000 mg | ORAL_TABLET | Freq: Every day | ORAL | Status: DC
  Administered 2019-02-25: 5 mg via ORAL
  Filled 2019-02-25 (×2): qty 1

## 2019-02-25 MED ORDER — LORAZEPAM 1 MG PO TABS
1.0000 mg | ORAL_TABLET | Freq: Once | ORAL | Status: AC
  Administered 2019-02-25: 1 mg via ORAL
  Filled 2019-02-25: qty 1

## 2019-02-25 MED ORDER — ZIPRASIDONE MESYLATE 20 MG IM SOLR
20.0000 mg | Freq: Once | INTRAMUSCULAR | Status: DC
  Filled 2019-02-25: qty 20

## 2019-02-25 NOTE — [facility] Group Notes (Signed)
[facility] Group Notes:  (Nursing)  Date:  02/25/2019  Time: 130 PM Type of Therapy:  Nurse Education  Participation Level:  Did Not Attend   Shela Nevin 02/25/2019, 8:24 PM

## 2019-02-25 NOTE — [facility] Counselor (Signed)
Clinical Social Work Note  Patient summoned Child psychotherapist and asked that contact be initiated with Leda Quail [phone]) to arrange his transportation home to [place] for tomorrow so he can be discharged.  He stated he will be going home to live in [place], and said we can contact his brother at [phone], although he did not yet sign a consent and did not provide brother's name.  CSW will follow up to ask for a signed consent.  A review of the chart reveals that patient has a case Designer, jewellery from [facility] in [place] (where his psychiatrist is located).  This case manager is working on a referral for patient to enter into rehab.  The above-mentioned transportation would take him to the rehab.  To follow through on this particular discharge plan, he would need to stay until Monday for both follow-up and transportation to be adequately arranged.  Ambrose Mantle, LCSW 02/25/2019, 1:32 PM

## 2019-02-25 NOTE — [facility] Group Notes (Signed)
[facility] Group Notes: (Clinical Social Work)   02/25/2019      Type of Therapy:  Group Therapy   Participation Level:  Did Not Attend - was invited both individually by MHT and by overhead announcement   Ambrose Mantle, LCSW 02/25/2019, 1:27 PM

## 2019-02-25 NOTE — Progress Notes (Signed)
[facility] Group Notes:  (Nursing/MHT/Case Management/Adjunct)  Date:  02/25/2019  Time:  2030  Type of Therapy:  wrap up group   Participation Level:  Active  Participation Quality:  Appropriate, Sharing and Supportive  Affect:  Anxious, Blunted and Irritable  Cognitive:  Appropriate  Insight:  Improving  Engagement in Group:  Engaged  Modes of Intervention:  Clarification, Education and Support  Summary of Progress/Problems: Pt is disgruntled that he is not receiving his Seroquel and that he has been on it for 15 years and it is the only thing that helps him sleep. Pt shares if he could change any one thing about his life it would be the need for a self esteem. Pt reports life would be much easier without worrying how one looks in the morning or how one feels about themselves and just focuses on awesome brand new day ahead. Pt is grateful for his dogs and to have woke today.   Marcille Buffy 02/25/2019, 9:40 PM

## 2019-02-25 NOTE — Progress Notes (Signed)
Patient ID: Cole Morrison, male   DOB: Jun 12, 1986, 33 y.o.   MRN: [id number]  Pt was invited to group via intercom, and chose not attend group.

## 2019-02-25 NOTE — Progress Notes (Signed)
D.  Pt anxious on approach, got up at nearly midnight and took HS medications.  Pt had been sleeping prior and due to having to receive a shot on day shift for extreme agitation, he was not awoken for medication pass or group.  Pt denies SI/HI/AVH at this time, but was passively suicidal on day shift.  A.  Support and encouragement offered, medications given as ordered  R.  Pt remain safe on the unit, will continue to monitor.

## 2019-02-25 NOTE — Progress Notes (Signed)
[facility] MD Progress Note  02/25/2019 12:17 PM Cole Morrison  MRN:  [id number] Subjective:  Patient is seen and examined. Patient is a 33 year old male with a past psychiatric history significant for opiate dependence, opiate withdrawal and reported bipolar disorder who presented to the [facility] emergency department on 02/22/2019 complaining of heroin and methamphetamine withdrawal. He stated he is from [place], and he was stuck in [place]. He had no way to get back to [place]. He stated he had been attempting to get to [facility]. He stated that he had been prescribed Seroquel, gabapentin and other medications for his bipolar disorder. He stated his last prescriptions had been prescribed by a physician in [place]  Objective: Patient is seen and examined.  Patient is a 33 year old male with the above-stated past psychiatric history was seen in follow-up.  He remains agitated again this morning.  He remains quite demanding.  He has not been attending groups.  He stated he wants to be back on the medications he was receiving from his doctor in [place].  I told him we have confirmed the ones that I given him, and the only one that I had not given him was the Wellbutrin.  He he stated he wanted that.  He stated he was thinking about being released tomorrow, and I told him I would be able to take care of that, then he decided he wants to stay.  Review of the electronic medical record showed that social work had attempted to contact some [place] sources to getting transported to [place] to be able to get into a rehabilitation facility.  I retrieve that phone number from the social worker note and gave him the number to arrange transportation.  He now stated that the Seroquel was over sedating him, and I gave him the option of either Risperdal or Zyprexa.  He is agreed to Zyprexa.  He denied any suicidal or homicidal ideation.  No evidence of psychotic symptoms.  His vital signs are  stable.  He is afebrile.  He slept 5.75 hours last night.  Principal Problem: <principal problem not specified> Diagnosis: Active Problems:   MDD (major depressive disorder)  Total Time spent with patient: 20 minutes  Past Psychiatric History: See admission H&P  Past Medical History:  Past Medical History:  Diagnosis Date  . Acid reflux   . Arthritis   . Bipolar 1 disorder (HCC)   . Diabetes mellitus without complication (HCC) Borderline  . Gastritis   . 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 Opioid abuse (HCC) 02/22/2019  . Opioid dependence (HCC) 02/22/2019  . Overactive bladder   . PTSD (post-traumatic stress disorder)     Past Surgical History:  Procedure Laterality Date  . WISDOM TOOTH EXTRACTION     Family History:  Family History  Problem Relation Age of Onset  . Hypertension Mother   . Hypertension Father    Family Psychiatric  History: See admission H&P Social History:  Social History   Substance and Sexual Activity  Alcohol Use No     Social History   Substance and Sexual Activity  Drug Use Yes  . Types: Marijuana, Oxycodone, Heroin, IV, Methamphetamines   Comment: Heroin, Fentanyl    Social History   Socioeconomic History  . Marital status: Single    Spouse name: Not on file  . Number of children: Not on file  . Years of education: Not on file  . Highest education level: Not on file  Occupational  History  . Not on file  Social Needs  . Financial resource strain: Not on file  . Food insecurity:    Worry: Not on file    Inability: Not on file  . Transportation needs:    Medical: Not on file    Non-medical: Not on file  Tobacco Use  . Smoking status: Current Every Day Smoker    Packs/day: 0.50    Types: Cigarettes  . Smokeless tobacco: Never Used  Substance and Sexual Activity  . Alcohol use: No  . Drug use: Yes    Types: Marijuana, Oxycodone, Heroin, IV, Methamphetamines    Comment: Heroin, Fentanyl  . Sexual activity: Not Currently   Lifestyle  . Physical activity:    Days per week: Not on file    Minutes per session: Not on file  . Stress: Not on file  Relationships  . Social connections:    Talks on phone: Not on file    Gets together: Not on file    Attends religious service: Not on file    Active member of club or organization: Not on file    Attends meetings of clubs or organizations: Not on file    Relationship status: Not on file  Other Topics Concern  . Not on file  Social History Narrative  . Not on file   Additional Social History:                         Sleep: Fair  Appetite:  Good  Current Medications: Current Facility-Administered Medications  Medication Dose Route Frequency Provider Last Rate Last Dose  . acetaminophen (TYLENOL) tablet 650 mg  650 mg Oral Q6H PRN Denzil Magnuson, NP   650 mg at 02/25/19 0756  . alum & mag hydroxide-simeth (MAALOX/MYLANTA) 200-200-20 MG/5ML suspension 30 mL  30 mL Oral Q4H PRN Denzil Magnuson, NP      . buPROPion [facility] SR) 12 hr tablet 100 mg  100 mg Oral Daily Antonieta Pert, MD      . dicyclomine (BENTYL) tablet 20 mg  20 mg Oral Q6H PRN Denzil Magnuson, NP   20 mg at 02/22/19 1757  . gabapentin (NEURONTIN) capsule 400 mg  400 mg Oral TID Antonieta Pert, MD      . hydrOXYzine (ATARAX/VISTARIL) tablet 25 mg  25 mg Oral Q6H PRN Denzil Magnuson, NP   25 mg at 02/24/19 1047  . loperamide (IMODIUM) capsule 2-4 mg  2-4 mg Oral PRN Denzil Magnuson, NP      . magnesium hydroxide (MILK OF MAGNESIA) suspension 30 mL  30 mL Oral Daily PRN Denzil Magnuson, NP      . methocarbamol (ROBAXIN) tablet 500 mg  500 mg Oral Q8H PRN Denzil Magnuson, NP   500 mg at 02/24/19 2348  . nicotine (NICODERM CQ - dosed in mg/24 hours) patch 21 mg  21 mg Transdermal Daily Antonieta Pert, MD   21 mg at 02/25/19 0919  . OLANZapine (ZYPREXA) tablet 5 mg  5 mg Oral QHS Antonieta Pert, MD      . OLANZapine zydis [facility]) disintegrating tablet 5 mg  5  mg Oral Q8H PRN Donell Sievert E, PA-C   5 mg at 02/24/19 0846  . ondansetron (ZOFRAN-ODT) disintegrating tablet 4 mg  4 mg Oral Q6H PRN Denzil Magnuson, NP      . traZODone (DESYREL) tablet 100 mg  100 mg Oral QHS PRN Antonieta Pert, MD      .  ziprasidone (GEODON) injection 20 mg  20 mg Intramuscular Once Aldean Baker, NP        Lab Results: No results found for this or any previous visit (from the past 48 hour(s)).  Blood Alcohol level:  Lab Results  Component Value Date   ETH <10 02/23/2019   ETH <10 02/22/2019    Metabolic Disorder Labs: No results found for: HGBA1C, MPG No results found for: PROLACTIN No results found for: CHOL, TRIG, HDL, CHOLHDL, VLDL, LDLCALC  Physical Findings: AIMS: Facial and Oral Movements Muscles of Facial Expression: None, normal Lips and Perioral Area: None, normal Jaw: None, normal Tongue: None, normal,Extremity Movements Upper (arms, wrists, hands, fingers): None, normal Lower (legs, knees, ankles, toes): None, normal, Trunk Movements Neck, shoulders, hips: None, normal, Overall Severity Severity of abnormal movements (highest score from questions above): None, normal Incapacitation due to abnormal movements: None, normal Patient's awareness of abnormal movements (rate only patient's report): No Awareness, Dental Status Current problems with teeth and/or dentures?: No Does patient usually wear dentures?: No  CIWA:  CIWA-Ar Total: 9 COWS:  COWS Total Score: 3  Musculoskeletal: Strength & Muscle Tone: within normal limits Gait & Station: normal Patient leans: N/A  Psychiatric Specialty Exam: Physical Exam  Nursing note and vitals reviewed. Constitutional: He is oriented to person, place, and time. He appears well-developed and well-nourished.  HENT:  Head: Normocephalic and atraumatic.  Respiratory: Effort normal.  Neurological: He is alert and oriented to person, place, and time.    ROS  Blood pressure 111/69, pulse 78,  temperature (!) 97.5 F (36.4 C), temperature source Oral, resp. rate 16, height  (1.676 m), weight 61.2 kg, SpO2 100 %.Body mass index is 21.79 kg/m.  General Appearance: Casual  Eye Contact:  Good  Speech:  Normal Rate  Volume:  Increased  Mood:  Irritable  Affect:  Congruent  Thought Process:  Coherent and Descriptions of Associations: Intact  Orientation:  Full (Time, Place, and Person)  Thought Content:  Logical  Suicidal Thoughts:  No  Homicidal Thoughts:  No  Memory:  Immediate;   Fair Recent;   Fair Remote;   Fair  Judgement:  Impaired  Insight:  Lacking  Psychomotor Activity:  Increased  Concentration:  Concentration: Fair and Attention Span: Fair  Recall:  [employer] of Knowledge:  Fair  Language:  Good  Akathisia:  Negative  Handed:  Right  AIMS (if indicated):     Assets:  Desire for Improvement Resilience  ADL's:  Intact  Cognition:  WNL  Sleep:  Number of Hours: 5.75     Treatment Plan Summary: Daily contact with patient to assess and evaluate symptoms and progress in treatment, Medication management and Plan : Patient is seen and examined.  Patient is a 33 year old male with the above-stated past psychiatric history who is seen in follow-up.    Diagnosis: #1 opiate dependence, #2 opiate withdrawal, #3 stimulant dependence, #4 history of bipolar disorder  Patient is essentially unchanged from yesterday.  He still getting agitated.  Most likely due to drug withdrawal and drug-seeking behavior.  He stated now that the Seroquel is making him too sleepy.  We will stop that, and place him on Zyprexa 5 mg p.o. nightly.  I have given him the phone number that I got out of the chart from the social worker note.  I am not sure whether he is going to request discharge tomorrow or not.  I have re-added his Wellbutrin SR 100 mg p.o.  daily.  There was consideration of either adding Depakote or Tegretol, but given the fact that he may leave tomorrow and rather not change  that.  I am going to give him a 300 mg dose of Neurontin right now to try and deal with his anxiety and agitation.  His vital signs have been stable.  I am going to stop the clonidine today.  He denied any suicidality, homicidality, auditory hallucinations or visual hallucinations. 1.  Stop clonidine but continue other medications on the opioid detox protocol. 2.  Increase gabapentin to 400 mg p.o. 3 times daily, and give 300 mg p.o. now x1 for agitation, anxiety and mood stability. 3.  Stop Seroquel. 4.  Zyprexa 5 mg p.o. nightly for mood stability and sleep. 5.  Continue agitation protocol with Zyprexa, Ativan or Geodon. 6.  Continue trazodone 100 mg p.o. nightly as needed insomnia. 7.  Disposition planning-in progress.  Antonieta PertGreg Lawson Javon Hupfer, MD 02/25/2019, 12:17 PM

## 2019-02-25 NOTE — Progress Notes (Signed)
D. Pt presents with an anxious affect/ mood- fidgety - presenting to nurse's station throughout the shift- pressured speech somewhat fixated on his meds Per pt's self inventory, pt rated his depression, hopelessness and anxiety an 8/7/7, respectively. Pt wrote that his most important goal today was "to get back to [place]" and "talk with Child psychotherapist".  Pt currently denies SI/HI and AVH  A. Labs and vitals monitored. Pt compliant with medications. Pt supported emotionally and encouraged to express concerns and ask questions.   R. Pt remains safe with 15 minute checks. Will continue POC.

## 2019-02-26 MED ORDER — BUPROPION HCL ER (SR) 100 MG PO TB12: 100.0000 mg | ORAL_TABLET | Freq: Two times a day (BID) | ORAL | 0 refills | Status: AC

## 2019-02-26 MED ORDER — BUPROPION HCL ER (SR) 100 MG PO TB12
100.0000 mg | ORAL_TABLET | Freq: Two times a day (BID) | ORAL | Status: DC
  Administered 2019-02-26 – 2019-02-27 (×2): 100 mg via ORAL
  Filled 2019-02-26: qty 1
  Filled 2019-02-26: qty 14
  Filled 2019-02-26: qty 1
  Filled 2019-02-26: qty 14
  Filled 2019-02-26: qty 1

## 2019-02-26 MED ORDER — QUETIAPINE FUMARATE 200 MG PO TABS
200.0000 mg | ORAL_TABLET | Freq: Every day | ORAL | Status: DC
  Administered 2019-02-26: 200 mg via ORAL
  Filled 2019-02-26 (×2): qty 1
  Filled 2019-02-26: qty 7

## 2019-02-26 MED ORDER — GABAPENTIN 400 MG PO CAPS: 400.0000 mg | ORAL_CAPSULE | Freq: Three times a day (TID) | ORAL | 0 refills | Status: AC

## 2019-02-26 MED ORDER — TRAZODONE HCL 100 MG PO TABS: 100.0000 mg | ORAL_TABLET | Freq: Every evening | ORAL | 0 refills | Status: AC | PRN

## 2019-02-26 MED ORDER — QUETIAPINE FUMARATE 200 MG PO TABS: 200.0000 mg | ORAL_TABLET | Freq: Every day | ORAL | 0 refills | Status: AC

## 2019-02-26 NOTE — Progress Notes (Signed)
[facility] Group Notes:  (Nursing/MHT/Case Management/Adjunct)  Date:  02/26/2019  Time: 2030 Type of Therapy:  wrap up group  Participation Level:  Active  Participation Quality:  Appropriate, Attentive, Sharing and Supportive  Affect:  Anxious and Appropriate  Cognitive:  Appropriate  Insight:  Improving  Engagement in Group:  Engaged  Modes of Intervention:  Clarification, Education and Support  Summary of Progress/Problems: Pt reports waiting on transportation to take him to further treatment in [place]. Pt isnt exactly looking forward to this but reports it being a positive thing to go to [place]. Pt reports one thing he is going to do differently is try and connect with people, and feel emotions rather than seek out drug dealers. Pt likes that even when things are bad for himself he can make others happy.   Marcille Buffy 02/26/2019, 9:41 PM

## 2019-02-26 NOTE — Discharge Summary (Signed)
Physician Discharge Summary Note  Patient:  Cole Morrison is an 33 y.o., male MRN:  [id number] DOB:  [birth year] Patient phone:  [phone] (home)  Patient address:   [place] [address],  Total Time spent with patient: 20 minutes  Date of Admission:  02/22/2019 Date of Discharge: 02/26/19  Reason for Admission:  Polysubstance abuse  Principal Problem: <principal problem not specified> Discharge Diagnoses: Active Problems:   MDD (major depressive disorder)   Past Psychiatric History: Patient has a history of several psychiatric hospitalizations.  The patient has been diagnosed with opiate dependence, polysubstance dependence, bipolar disorder.  His last hospitalization at one of our facilities was approximately 2 years ago.  Past Medical History:  Past Medical History:  Diagnosis Date  . Acid reflux   . Arthritis   . Bipolar 1 disorder ([place])   . Diabetes mellitus without complication (HCC) Borderline  . Gastritis   . 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 Opioid abuse ([place]) 02/22/2019  . Opioid dependence ([place]) 02/22/2019  . Overactive bladder   . PTSD (post-traumatic stress disorder)     Past Surgical History:  Procedure Laterality Date  . WISDOM TOOTH EXTRACTION     Family History:  Family History  Problem Relation Age of Onset  . Hypertension Mother   . Hypertension Father    Family Psychiatric  History: Denies Social History:  Social History   Substance and Sexual Activity  Alcohol Use No     Social History   Substance and Sexual Activity  Drug Use Yes  . Types: Marijuana, Oxycodone, Heroin, IV, Methamphetamines   Comment: Heroin, Fentanyl    Social History   Socioeconomic History  . Marital status: Single    Spouse name: Not on file  . Number of children: Not on file  . Years of education: Not on file  . Highest education level: Not on file  Occupational History  . Not on file  Social Needs  . Financial resource strain: Not on file   . Food insecurity:    Worry: Not on file    Inability: Not on file  . Transportation needs:    Medical: Not on file    Non-medical: Not on file  Tobacco Use  . Smoking status: Current Every Day Smoker    Packs/day: 0.50    Types: Cigarettes  . Smokeless tobacco: Never Used  Substance and Sexual Activity  . Alcohol use: No  . Drug use: Yes    Types: Marijuana, Oxycodone, Heroin, IV, Methamphetamines    Comment: Heroin, Fentanyl  . Sexual activity: Not Currently  Lifestyle  . Physical activity:    Days per week: Not on file    Minutes per session: Not on file  . Stress: Not on file  Relationships  . Social connections:    Talks on phone: Not on file    Gets together: Not on file    Attends religious service: Not on file    Active member of club or organization: Not on file    Attends meetings of clubs or organizations: Not on file    Relationship status: Not on file  Other Topics Concern  . Not on file  Social History Narrative  . Not on file    Hospital Course:   02/23/19 [name] Assessment: 33 year old male with a past psychiatric history significant for opiate dependence, opiate withdrawal and reported bipolar disorder who presented to the [name] emergency department on 02/22/2019 complaining of heroin  and methamphetamine withdrawal. He stated he is from [place], and he was stuck in [place]. He had no way to get back to [place]. He stated he had been attempting to get to [facility]. He stated that he had been prescribed Seroquel, gabapentin and other medications for his bipolar disorder. He stated his last prescriptions had been prescribed by a physician in [place]. He has several psychiatric hospitalizations in the past both at our facility as well as [facility]. His diagnosis on the last 2 admissions were severe opioid use disorder as well as polysubstance abuse/dependence. His last admission at our facility was 2  years ago on 07/22/2016. He stated he had used methamphetamines as well as heroin the day prior to admission. His drug screen was positive for amphetamines, but no opiates. His blood alcohol was negative. The rest of his screening laboratories were essentially negative. He denied suicidal ideation this morning. He stated he was hungry. He denied any auditory or visual hallucinations. He was admitted to the hospital for evaluation and stabilization.  Patient remained on the [facility] unit for 3 days. The patient stabilized on medication and therapy. Patient was discharged on Wellbutrin SR 100 mg BID, Neurontin 400 mg TID, Vistaril 25 mg Q6H PRN, Seroquel 200 mg QHS, and trazodone 100 mg QHS PRN. Patient has shown improvement with improved mood, affect, sleep, appetite, and interaction. Patient has attended group and participated. Patient has been seen in the day room interacting with peers and staff appropriately. Patient denies any SI/HI/AVH and contracts for safety. Patient agrees to follow up at [facility]. Patient is provided with prescriptions for their medications upon discharge.   Physical Findings: AIMS: Facial and Oral Movements Muscles of Facial Expression: None, normal Lips and Perioral Area: None, normal Jaw: None, normal Tongue: None, normal,Extremity Movements Upper (arms, wrists, hands, fingers): None, normal Lower (legs, knees, ankles, toes): None, normal, Trunk Movements Neck, shoulders, hips: None, normal, Overall Severity Severity of abnormal movements (highest score from questions above): None, normal Incapacitation due to abnormal movements: None, normal Patient's awareness of abnormal movements (rate only patient's report): No Awareness, Dental Status Current problems with teeth and/or dentures?: No Does patient usually wear dentures?: No  CIWA:  CIWA-Ar Total: 9 COWS:  COWS Total Score: 4  Musculoskeletal: Strength & Muscle Tone: within  normal limits Gait & Station: normal Patient leans: N/A  Psychiatric Specialty Exam: Physical Exam  Nursing note and vitals reviewed. Constitutional: He is oriented to person, place, and time. He appears well-developed and well-nourished.  Cardiovascular: Normal rate.  Respiratory: Effort normal.  Musculoskeletal: Normal range of motion.  Neurological: He is alert and oriented to person, place, and time.  Skin: Skin is warm.    Review of Systems  Constitutional: Negative.   HENT: Negative.   Eyes: Negative.   Respiratory: Negative.   Cardiovascular: Negative.   Gastrointestinal: Negative.   Genitourinary: Negative.   Musculoskeletal: Negative.   Skin: Negative.   Neurological: Negative.   Endo/Heme/Allergies: Negative.   Psychiatric/Behavioral: Negative.     Blood pressure 102/75, pulse (!) 107, temperature 98.4 F (36.9 C), temperature source Oral, resp. rate 16, height _0  (1.676 m), weight 61.2 kg, SpO2 100 %.Body mass index is 21.79 kg/m.  General Appearance: Casual  Eye Contact:  Good  Speech:  Clear and Coherent and Normal Rate  Volume:  Normal  Mood:  Anxious  Affect:  Congruent  Thought Process:  Coherent and Descriptions of Associations: Intact  Orientation:  Full (Time, Place, and Person)  Thought Content:  WDL  Suicidal Thoughts:  No  Homicidal Thoughts:  No  Memory:  Immediate;   Good Recent;   Good Remote;   Good  Judgement:  Fair  Insight:  Fair  Psychomotor Activity:  Normal  Concentration:  Concentration: Good and Attention Span: Good  Recall:  Good  Fund of Knowledge:  Good  Language:  Good  Akathisia:  No  Handed:  Right  AIMS (if indicated):     Assets:  Communication Skills Desire for Improvement Financial Resources/Insurance Housing Physical Health Social Support Transportation  ADL's:  Intact  Cognition:  WNL  Sleep:  Number of Hours: 5.25     Have you used any form of tobacco in the last 30 days? (Cigarettes, Smokeless  Tobacco, Cigars, and/or Pipes): Yes  Has this patient used any form of tobacco in the last 30 days? (Cigarettes, Smokeless Tobacco, Cigars, and/or Pipes) Yes, Yes, A prescription for an FDA-approved tobacco cessation medication was offered at discharge and the patient refused  Blood Alcohol level:  Lab Results  Component Value Date   ETH <10 02/23/2019   ETH <10 40/98/1191    Metabolic Disorder Labs:  No results found for: HGBA1C, MPG No results found for: PROLACTIN No results found for: CHOL, TRIG, HDL, CHOLHDL, VLDL, LDLCALC  See Psychiatric Specialty Exam and Suicide Risk Assessment completed by Attending Physician prior to discharge.  Discharge destination:  Home  Is patient on multiple antipsychotic therapies at discharge:  No   Has Patient had three or more failed trials of antipsychotic monotherapy by history:  No  Recommended Plan for Multiple Antipsychotic Therapies: NA   Allergies as of 02/26/2019      Reactions   Haldol [haloperidol] Anaphylaxis   Ibuprofen Anaphylaxis, Hives, Nausea And Vomiting, Swelling      Medication List    STOP taking these medications   ARIPiprazole 15 MG tablet Commonly known as:  ABILIFY   naloxone 4 MG/0.1ML Liqd nasal spray kit Commonly known as:  NARCAN     TAKE these medications     Indication  buPROPion 100 MG 12 hr tablet Commonly known as:  WELLBUTRIN SR Take 1 tablet (100 mg total) by mouth 2 (two) times daily.  Indication:  Major Depressive Disorder   gabapentin 400 MG capsule Commonly known as:  NEURONTIN Take 1 capsule (400 mg total) by mouth 3 (three) times daily. What changed:    medication strength  how much to take  when to take this  Indication:  Abuse or Misuse of Alcohol, Neuropathic Pain   QUEtiapine 200 MG tablet Commonly known as:  SEROQUEL Take 1 tablet (200 mg total) by mouth at bedtime. What changed:    medication strength  how much to take  when to take this  Indication:  mood  stability   traZODone 100 MG tablet Commonly known as:  DESYREL Take 1 tablet (100 mg total) by mouth at bedtime as needed for sleep.  Indication:  [facility] Follow up.   Why:  You have an ongoing therapeutic relationship with Dr. Meredith Pel.  Please contact his office within 2-3 days of discharge to arrange to see him again for medication management.  Caseworker Demetrios Isaacs # is [phone]. Contact information: [address] [place] [address]  [phone]          Follow-up recommendations:  Continue activity as tolerated. Continue diet as recommended by your PCP. Ensure to keep all appointments with outpatient providers.  Comments:  Patient is instructed prior to discharge to: Take all medications as prescribed by his/her mental healthcare provider. Report any adverse effects and or reactions from the medicines to his/her outpatient provider promptly. Patient has been instructed & cautioned: To not engage in alcohol and or illegal drug use while on prescription medicines. In the event of worsening symptoms, patient is instructed to call the crisis hotline, 911 and or go to the nearest ED for appropriate evaluation and treatment of symptoms. To follow-up with his/her primary care provider for your other medical issues, concerns and or health care needs.    Signed: [place], FNP 02/26/2019, 2:33 PM

## 2019-02-26 NOTE — Progress Notes (Signed)
D.  Pt pleasant on approach, hopeful for discharge soon.  Pt is awaiting a ride supplied by Medicare.  Pt was positive for evening wrap up group, observed engaged in appropriate interaction with peers on unit.  Pt is less anxious and pacing less tonight.  Pt denies SI/HI/AVH at this time.  A. Support and encouragement offered, medication given as ordered  R. Pt remains safe on the unit, will continue to monitor.

## 2019-02-26 NOTE — BHH Suicide Risk Assessment (Addendum)
[facility] INPATIENT:  Family/Significant Other Suicide Prevention Education  Suicide Prevention Education:  Contact Attempts:  brother Domminic Kren [phone] , (name of family member/significant other) has been identified by the patient as the family member/significant other with whom the patient will be residing, and identified as the person(s) who will aid the patient in the event of a mental health crisis.  With written consent from the patient, two attempts were made to provide suicide prevention education, prior to and/or following the patient's discharge.  We were unsuccessful in providing suicide prevention education.  A suicide education pamphlet was given to the patient to share with family/significant other.  Date and time of first attempt:  02/26/2019  /  8:20am - HIPAA-compliant voicemail left  -- PATIENT STATES IF HE DOES NOT GET INTO REHAB THAT IS BEING SOUGHT BY HIS CASE WORKER IN [place], HE CAN GO STAY WITH HIS BROTHER.  THIS QUESTION WAS LEFT ON VOICEMAIL WITH A REQUEST FOR A CALL BACK.  Date and time of second attempt:   02/26/2019  / 9:45 AM  - VM left re patient discharging today  [name] 02/26/2019, 8:22 AM

## 2019-02-26 NOTE — B[facility] Group Notes (Signed)
B[facility] LCSW Group Therapy Note  02/26/2019  10:00-11:00AM  Type of Therapy and Topic:  Group Therapy:  Acknowledging and Resolving Issues with Mothers  Participation Level:  Did Not Attend   Description of Group:   Patients in this group were asked to briefly describe their experience with the mother figure(s) in their lives, both in childhood and adulthood.  Different types of support provided by these individuals were identified.   Patients were then encouraged to determine whether their mother figure was or is a healthy or unhealthy support.  The manner in which that early relationship has shaped patient's feelings and life decisions was pointed out and acknowledged.  Group members gave support to each other.  CSW led a discussion on how helpful it can be to resolve past issues, and how this can be done whether the mother figure is now alive or already deceased.  An emphasis was placed on continuing to work with a therapist on these issues  when patients leave the hospital in order to be able to focus on the future instead of the past, to continue becoming healthier and happier.   Therapeutic Goals: 1)  discuss the possibility of mother figure(s) being positive and/or negative in one's life, normalizing that some people never had positive experiences with "maternal" persons  2)  describe patient's specific example of mother figure(s), allowing time to vent  3)  identify the patient's current need for resolution in the relationship with the aforementioned person  4)  elicit commitments to work on resolving feelings about mother figure(s) in order to move forward in life and wellness   Summary of Patient Progress:  N/A   Therapeutic Modalities:   Processing Brief Solution-Focused Therapy  Ameah Chanda J Grossman-Orr       

## 2019-02-26 NOTE — BHH Counselor (Signed)
Clinical Social Work Note  At direction of staff at [employer] [phone] earlier today when ride for patient to return to [place] was booked, CSW called Member Services to follow up on the authorization.  Confirmation # is [id number].  Member Services is almost completely shut down over the weekend and the one person who could be contacted was unable to give any information about the ride specifically.  They did state that patient's insurance through that specific branch of [employer] had terminated in 09/2018, but there were authorizations for ambulance rides in the system, which would indicate maybe he is being served now by [employer].  They advised a call to [employer] [phone], but CSW could not get through, as all services are closed for the weekend.  CSW called back directly to [employer] and was informed that the ride is indeed still in the system, but none of their contractors has picked it up yet, although it remains their intent to fill the request as soon as possible.  They have no way of knowing when someone will see the availability of this booking and agree to fill it.  Thus, patient could be here overnight once more due to lack of transportation.  CSW informed them of nurses station telephone number and told them he could leave at any time when a ride is available.  A phone call will be placed to nurses station prior to anyone from [employer] coming to pick up patient.  CSW spoke with patient to see if there were any other options for him for transportation, but he knows of none.  The process was explained to him and while he was not happy about it, he expressed the willingness to be patient.  He was concerned he would be discharged to "the street" or "the lobby" and was told this was not the plan.  Nurse Practitioner and patient's nurse were informed.  Ambrose Mantle, LCSW 02/26/2019, 1:22 PM

## 2019-02-26 NOTE — Progress Notes (Addendum)
  [facility] Adult Case Management Discharge Plan :  Will you be returning to the same living situation after discharge:  No.  Going to brother's house At discharge, do you have transportation home?: Yes,  insurance company has arranged pick-up Do you have the ability to pay for your medications: Yes,  insurance  Release of information consent forms completed and turned in to Medical Records by CSW.   Patient to Follow up at: Follow-up Information    [facility] Follow up.   Why:  You have an ongoing therapeutic relationship with Dr. Verner Chol.  Please contact his office within 2-3 days of discharge to arrange to see him again for medication management.  Caseworker Sheppard Plumber # is [phone]. Contact information: [address]   [name], [place] [address]  [phone]          Next level of care provider has access to [name] Link:no  Safety Planning and Suicide Prevention discussed: No.  With patient only  Have you used any form of tobacco in the last 30 days? (Cigarettes, Smokeless Tobacco, Cigars, and/or Pipes): Yes  Has patient been referred to the Quitline?: N/A patient is a [place] resident  Patient has been referred for addiction treatment: Yes  Cole Chad, LCSW 02/26/2019, 9:41 AM

## 2019-02-26 NOTE — Progress Notes (Signed)
D.  Pt anxious and agitated at beginning of shift.  Pt stated that he needed something else, he could feel his anxiety building.  Pt given ordered prn medication, as well as scheduled medication.  Pt states that zyprexa is not as effect as Seroquel was and believes that he would like to go back to taking that.  Pt requested repeat dose be added to Trazodone order if unable to get Seroquel tonight.  Pt denies SI/HI/AVH at this time.  A. Support and encouragement offered, medication given as ordered.  Spoke to NP and repeat dose ordered for Trazodone.  Pt encouraged to speak to provider about Seroquel tomorrow.  R.  Pt remains safe on the unit, will continue to monitor.

## 2019-02-26 NOTE — BHH Counselor (Signed)
Clinical Social Work Note  Received information that [employer] called back due to problems with approving member's transportation from this facility to his home in [place], and we were to call back.  Called [phone], on hold 22 minutes.  CSW was informed that earlier trip was cancelled because "member was ineligible" but the customer service rep did not know if this is because it had not been pre-certified or for some other reason.  The customer service rep booked the request for transportation one more time, and stated we are to call Member Services at [phone] to determine if he was approved.  Ambrose Mantle, LCSW 02/26/2019, 10:50 AM

## 2019-02-26 NOTE — BHH Suicide Risk Assessment (Signed)
[facility] Discharge Suicide Risk Assessment   Principal Problem: <principal problem not specified> Discharge Diagnoses: Active Problems:   MDD (major depressive disorder)   Total Time spent with patient: 15 minutes  Musculoskeletal: Strength & Muscle Tone: within normal limits Gait & Station: normal Patient leans: N/A  Psychiatric Specialty Exam: Review of Systems  All other systems reviewed and are negative.   Blood pressure 102/75, pulse (!) 107, temperature 98.4 F (36.9 C), temperature source Oral, resp. rate 16, height 5\' 6"  (1.676 m), weight 61.2 kg, SpO2 100 %.Body mass index is 21.79 kg/m.  General Appearance: Casual  Eye Contact::  Fair  Speech:  Normal Rate409  Volume:  Normal  Mood:  Anxious  Affect:  Congruent  Thought Process:  Coherent and Descriptions of Associations: Circumstantial  Orientation:  Full (Time, Place, and Person)  Thought Content:  Logical  Suicidal Thoughts:  No  Homicidal Thoughts:  No  Memory:  Immediate;   Fair Recent;   Fair Remote;   Fair  Judgement:  Intact  Insight:  Fair  Psychomotor Activity:  Increased  Concentration:  Fair  Recall:  [employer] of Knowledge:Fair  Language: Fair  Akathisia:  Negative  Handed:  Right  AIMS (if indicated):     Assets:  Desire for Improvement Resilience  Sleep:  Number of Hours: 5.25  Cognition: WNL  ADL's:  Intact   Mental Status Per Nursing Assessment::   On Admission:  NA  Demographic Factors:  Male, Caucasian, Low socioeconomic status, Living alone and Unemployed  Loss Factors: Financial problems/change in socioeconomic status  Historical Factors: Impulsivity  Risk Reduction Factors:   NA  Continued Clinical Symptoms:  Bipolar Disorder:   Depressive phase Alcohol/Substance Abuse/Dependencies  Cognitive Features That Contribute To Risk:  None    Suicide Risk:  Minimal: No identifiable suicidal ideation.  Patients presenting with no risk factors but with morbid ruminations;  may be classified as minimal risk based on the severity of the depressive symptoms  Follow-up Information    [facility] Follow up.   Why:  You have an ongoing therapeutic relationship with Dr. Verner Chol.  Please contact his office within 2-3 days of discharge to arrange to see him again for medication management.  Caseworker Sheppard Plumber # is [phone]. Contact information: [address]   [place], [place] [address]  [phone]          Plan Of Care/Follow-up recommendations:  Activity:  ad lib  Antonieta Pert, MD 02/26/2019, 11:04 AM

## 2019-02-26 NOTE — Progress Notes (Signed)
D. Pt presents with an anxious affect congruent with mood. Per pt's self inventory, pt rates his depression, hopelessness and anxiety an 8/9/7, respectively. Pt writes that his goal today is "to work on discharge plan". Pt currently denies SI/HI and AVH  A. Labs and vitals monitored. Pt compliant with medications. Pt supported emotionally and encouraged to express concerns and ask questions.   R. Pt remains safe with 15 minute checks. Will continue POC.

## 2019-02-26 NOTE — [facility] Group Notes (Signed)
[facility] Group Notes:  (Nursing Date:  02/26/2019  Time: 130 PM Type of Therapy:  Nurse Education  Participation Level:  Did Not Attend   Shela Nevin 02/26/2019, 6:40 PM

## 2019-02-27 NOTE — Progress Notes (Signed)
Recreation Therapy Notes  Date:  5.11.20 Time: 0930 Location: [address] Dayroom  Group Topic: Stress Management  Goal Area(s) Addresses:  Patient will identify positive stress management techniques. Patient will identify benefits of using stress management post d/c.  Intervention: Stress Management  Activity :  Meditation.  LRT introduced the stress management technique of meditation.  LRT played a meditation that focused on making the most of your day.  Patients were to listen and follow along as meditation played.  Education:  Stress Management, Discharge Planning.   Education Outcome: Acknowledges Education  Clinical Observations/Feedback:  Pt did not attend group session.    Caroll Rancher, LRT/CTRS         Lillia Abed, Torre Pikus A 02/27/2019 11:04 AM

## 2019-02-27 NOTE — Progress Notes (Signed)
Discharge note: Patient reviewed discharge paperwork with RN including prescriptions, follow up appointments, and lab work. Patient given the opportunity to ask questions. All concerns were addressed. All belongings were returned to patient. Denied SI/HI/AVH. Patient thanked staff for their care while at the hospital.  Patient was discharged to lobby. 

## 2019-02-27 NOTE — Plan of Care (Signed)
  Problem: Education: Goal: Knowledge of [facility] Education information/materials will improve Outcome: Adequate for Discharge   Problem: Education: Goal: Emotional status will improve Outcome: Adequate for Discharge   Problem: Education: Goal: Mental status will improve Outcome: Adequate for Discharge   Problem: Education: Goal: Verbalization of understanding the information provided will improve Outcome: Adequate for Discharge   

## 2019-02-27 NOTE — [facility] Suicide Risk Assessment (Signed)
[facility] Discharge Suicide Risk Assessment   Principal Problem: <principal problem not specified> Discharge Diagnoses: Active Problems:   MDD (major depressive disorder)   Total Time spent with patient: 15 minutes  Musculoskeletal: Strength & Muscle Tone: within normal limits Gait & Station: normal Patient leans: N/A  Psychiatric Specialty Exam: Review of Systems  All other systems reviewed and are negative.   Blood pressure 108/67, pulse 97, temperature 97.9 F (36.6 C), temperature source Oral, resp. rate 16, height 5\' 6"  (1.676 m), weight 61.2 kg, SpO2 100 %.Body mass index is 21.79 kg/m.  General Appearance: Casual  Eye Contact::  Fair  Speech:  Normal Rate409  Volume:  Normal  Mood:  Euthymic  Affect:  Congruent  Thought Process:  Coherent and Descriptions of Associations: Intact  Orientation:  Full (Time, Place, and Person)  Thought Content:  Logical  Suicidal Thoughts:  No  Homicidal Thoughts:  No  Memory:  Immediate;   Fair Recent;   Fair Remote;   Fair  Judgement:  Fair  Insight:  Fair  Psychomotor Activity:  Normal  Concentration:  Fair  Recall:  [employer] of Knowledge:Fair  Language: Negative  Akathisia:  Negative  Handed:  Right  AIMS (if indicated):     Assets:  Desire for Improvement Resilience  Sleep:  Number of Hours: 6.25  Cognition: WNL  ADL's:  Intact   Mental Status Per Nursing Assessment::   On Admission:  NA  Demographic Factors:  Male, Caucasian, Low socioeconomic status and Unemployed  Loss Factors: Financial problems/change in socioeconomic status  Historical Factors: Impulsivity  Risk Reduction Factors:   NA  Continued Clinical Symptoms:  Bipolar Disorder:   Depressive phase Alcohol/Substance Abuse/Dependencies  Cognitive Features That Contribute To Risk:  None    Suicide Risk:  Minimal: No identifiable suicidal ideation.  Patients presenting with no risk factors but with morbid ruminations; may be classified as minimal  risk based on the severity of the depressive symptoms  Follow-up Information    [facility] [place] Follow up.   Why:  You have an ongoing therapeutic relationship with Dr. Verner Chol.  Please contact his office within 2-3 days of discharge to arrange to see him again for medication management.  Caseworker Sheppard Plumber # is [phone]. Contact information: [address]   [place], [place] [address]  [phone]          Plan Of Care/Follow-up recommendations:  Activity:  ad lib  Antonieta Pert, MD 02/27/2019, 7:46 AM

## 2019-02-27 NOTE — Progress Notes (Signed)
  [facility] Adult Case Management Discharge Plan :  Will you be returning to the same living situation after discharge:  No.; pt is going back to [place] for residential treatment At discharge, do you have transportation home?: Yes,  medicaid transportation to [place] Do you have the ability to pay for your medications: Yes,  medicaid in [place]  Release of information consent forms completed and in the chart;  Patient's signature needed at discharge.  Patient to Follow up at: Follow-up Information    [facility] [place] Follow up.   Why:  You have an ongoing therapeutic relationship with Dr. Verner Chol.  Please contact his office within 2-3 days of discharge to arrange to see him again for medication management.  Caseworker Sheppard Plumber # is [phone]. Contact information: [address]   [place], [place] [address]  [phone]          Next level of care provider has access to [facility] Link:no  Safety Planning and Suicide Prevention discussed: No.; pt refused but then gave consent for brother. CSW attempted twice.   Have you used any form of tobacco in the last 30 days? (Cigarettes, Smokeless Tobacco, Cigars, and/or Pipes): Yes  Has patient been referred to the Quitline?: Patient refused referral  Patient has been referred for addiction treatment: Yes  Delphia Grates, LCSW 02/27/2019, 10:27 AM
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
